# Patient Record
Sex: Female | Born: 1950
Health system: Southern US, Community
[De-identification: ages and names within clinical notes are randomized; demographics above are authoritative.]

## PROBLEM LIST (undated history)

## (undated) DIAGNOSIS — K219 Gastro-esophageal reflux disease without esophagitis: Secondary | ICD-10-CM

## (undated) DIAGNOSIS — C44629 Squamous cell carcinoma of skin of left upper limb, including shoulder: Secondary | ICD-10-CM

## (undated) DIAGNOSIS — Z5189 Encounter for other specified aftercare: Secondary | ICD-10-CM

## (undated) DIAGNOSIS — C44729 Squamous cell carcinoma of skin of left lower limb, including hip: Secondary | ICD-10-CM

## (undated) DIAGNOSIS — S72145A Nondisplaced intertrochanteric fracture of left femur, initial encounter for closed fracture: Secondary | ICD-10-CM

## (undated) DIAGNOSIS — I10 Essential (primary) hypertension: Secondary | ICD-10-CM

## (undated) DIAGNOSIS — H269 Unspecified cataract: Secondary | ICD-10-CM

## (undated) DIAGNOSIS — C801 Malignant (primary) neoplasm, unspecified: Secondary | ICD-10-CM

## (undated) DIAGNOSIS — M8580 Other specified disorders of bone density and structure, unspecified site: Secondary | ICD-10-CM

## (undated) HISTORY — DX: Malignant (primary) neoplasm, unspecified: C80.1

## (undated) HISTORY — DX: Unspecified cataract: H26.9

## (undated) HISTORY — DX: Encounter for other specified aftercare: Z51.89

## (undated) HISTORY — DX: Squamous cell carcinoma of skin of left lower limb, including hip: C44.729

## (undated) HISTORY — DX: Nondisplaced intertrochanteric fracture of left femur, initial encounter for closed fracture: S72.145A

## (undated) HISTORY — DX: Gastro-esophageal reflux disease without esophagitis: K21.9

## (undated) HISTORY — PX: CHOLECYSTECTOMY: SHX55

## (undated) HISTORY — PX: BREAST EXCISIONAL BIOPSY: SUR124

## (undated) HISTORY — DX: Squamous cell carcinoma of skin of left upper limb, including shoulder: C44.629

## (undated) HISTORY — PX: FRACTURE SURGERY: SHX138

---

## 2015-07-15 DIAGNOSIS — N952 Postmenopausal atrophic vaginitis: Secondary | ICD-10-CM | POA: Insufficient documentation

## 2015-07-15 DIAGNOSIS — J309 Allergic rhinitis, unspecified: Secondary | ICD-10-CM | POA: Insufficient documentation

## 2015-07-15 DIAGNOSIS — M8580 Other specified disorders of bone density and structure, unspecified site: Secondary | ICD-10-CM | POA: Insufficient documentation

## 2015-07-15 DIAGNOSIS — Z780 Asymptomatic menopausal state: Secondary | ICD-10-CM | POA: Insufficient documentation

## 2015-07-16 DIAGNOSIS — L409 Psoriasis, unspecified: Secondary | ICD-10-CM | POA: Insufficient documentation

## 2016-09-07 LAB — HM COLONOSCOPY

## 2019-07-03 ENCOUNTER — Other Ambulatory Visit: Payer: Self-pay | Admitting: Adult Health

## 2019-07-03 ENCOUNTER — Ambulatory Visit: Payer: Self-pay | Admitting: Medical

## 2019-07-03 MED ORDER — SODIUM CHLORIDE 0.9 % IV SOLN
700.0000 mg | Freq: Once | INTRAVENOUS | Status: AC
  Administered 2019-07-04: 700 mg via INTRAVENOUS
  Filled 2019-07-03: qty 20

## 2019-07-03 NOTE — Progress Notes (Signed)
  I connected by phone with Kimberly Whitney on 07/03/2019 at 5:17 PM to discuss the potential use of an new treatment for mild to moderate COVID-19 viral infection in non-hospitalized patients.  This patient is a 69 y.o. female that meets the FDA criteria for Emergency Use Authorization of bamlanivimab or casirivimab\imdevimab.  Has a (+) direct SARS-CoV-2 viral test result  Has mild or moderate COVID-19   Is ? 69 years of age and weighs ? 40 kg  Is NOT hospitalized due to COVID-19  Is NOT requiring oxygen therapy or requiring an increase in baseline oxygen flow rate due to COVID-19  Is within 10 days of symptom onset  Has at least one of the high risk factor(s) for progression to severe COVID-19 and/or hospitalization as defined in EUA.  Specific high risk criteria : >/= 68 yo   I have spoken and communicated the following to the patient or parent/caregiver:  1. FDA has authorized the emergency use of bamlanivimab and casirivimab\imdevimab for the treatment of mild to moderate COVID-19 in adults and pediatric patients with positive results of direct SARS-CoV-2 viral testing who are 6 years of age and older weighing at least 40 kg, and who are at high risk for progressing to severe COVID-19 and/or hospitalization.  2. The significant known and potential risks and benefits of bamlanivimab and casirivimab\imdevimab, and the extent to which such potential risks and benefits are unknown.  3. Information on available alternative treatments and the risks and benefits of those alternatives, including clinical trials.  4. Patients treated with bamlanivimab and casirivimab\imdevimab should continue to self-isolate and use infection control measures (e.g., wear mask, isolate, social distance, avoid sharing personal items, clean and disinfect "high touch" surfaces, and frequent handwashing) according to CDC guidelines.   5. The patient or parent/caregiver has the option to accept or refuse  bamlanivimab or casirivimab\imdevimab .  After reviewing this information with the patient, The patient agreed to proceed with receiving the bamlanimivab infusion and will be provided a copy of the Fact sheet prior to receiving the infusion.   Test results faxed over . Above information discussed and sent to patient my chart per team earlier today , patient declines any questions . [name] 07/03/2019 5:17 PM

## 2019-07-04 ENCOUNTER — Encounter (HOSPITAL_COMMUNITY): Payer: Self-pay

## 2019-07-04 ENCOUNTER — Ambulatory Visit (HOSPITAL_COMMUNITY)
Admission: RE | Admit: 2019-07-04 | Discharge: 2019-07-04 | Disposition: A | Discharge status: Home / Self Care | Payer: BC Managed Care – PPO | Source: Ambulatory Visit | Attending: Pulmonary Disease | Admitting: Pulmonary Disease

## 2019-07-04 DIAGNOSIS — Z20822 Contact with and (suspected) exposure to covid-19: Secondary | ICD-10-CM | POA: Insufficient documentation

## 2019-07-04 MED ORDER — ALBUTEROL SULFATE HFA 108 (90 BASE) MCG/ACT IN AERS: 2.0000 | INHALATION_SPRAY | Freq: Once | RESPIRATORY_TRACT | Status: DC | PRN

## 2019-07-04 MED ORDER — SODIUM CHLORIDE 0.9 % IV SOLN
INTRAVENOUS | Status: DC | PRN
  Administered 2019-07-04: 250 mL via INTRAVENOUS

## 2019-07-04 MED ORDER — DIPHENHYDRAMINE HCL 50 MG/ML IJ SOLN: 50.0000 mg | Freq: Once | INTRAMUSCULAR | Status: DC | PRN

## 2019-07-04 MED ORDER — FAMOTIDINE IN NACL 20-0.9 MG/50ML-% IV SOLN: 20.0000 mg | Freq: Once | INTRAVENOUS | Status: DC | PRN

## 2019-07-04 MED ORDER — METHYLPREDNISOLONE SODIUM SUCC 125 MG IJ SOLR: 125.0000 mg | Freq: Once | INTRAMUSCULAR | Status: DC | PRN

## 2019-07-04 MED ORDER — EPINEPHRINE 0.3 MG/0.3ML IJ SOAJ: 0.3000 mg | Freq: Once | INTRAMUSCULAR | Status: DC | PRN

## 2019-07-04 NOTE — Progress Notes (Signed)
  Diagnosis: COVID-19  Physician:  Procedure: [facility] Med: bamlanivimab infusion - Provided patient with bamlanimivab fact sheet for patients, parents and caregivers prior to infusion.  Complications: No immediate complications noted.  Discharge: Discharged home   [place], [place] 07/04/2019

## 2019-08-13 ENCOUNTER — Ambulatory Visit: Payer: Self-pay | Admitting: Family Medicine

## 2019-11-12 ENCOUNTER — Ambulatory Visit: Payer: Self-pay | Admitting: Family Medicine

## 2020-01-21 LAB — HM MAMMOGRAPHY

## 2020-01-21 LAB — HM DEXA SCAN

## 2020-01-23 ENCOUNTER — Inpatient Hospital Stay (HOSPITAL_COMMUNITY): Payer: BC Managed Care – PPO | Admitting: Certified Registered"

## 2020-01-23 ENCOUNTER — Encounter (HOSPITAL_COMMUNITY)
Admission: EM | Disposition: A | Discharge status: Rehabilitation Facility | Payer: Self-pay | Source: Home / Self Care | Attending: Internal Medicine

## 2020-01-23 ENCOUNTER — Emergency Department (HOSPITAL_COMMUNITY): Payer: BC Managed Care – PPO

## 2020-01-23 ENCOUNTER — Inpatient Hospital Stay (HOSPITAL_COMMUNITY)
Admission: EM | Admit: 2020-01-23 | Discharge: 2020-01-29 | DRG: 481 | Disposition: A | Discharge status: Rehabilitation Facility | Payer: BC Managed Care – PPO | Attending: Internal Medicine | Admitting: Internal Medicine

## 2020-01-23 ENCOUNTER — Other Ambulatory Visit: Payer: Self-pay

## 2020-01-23 ENCOUNTER — Encounter (HOSPITAL_COMMUNITY): Payer: Self-pay

## 2020-01-23 DIAGNOSIS — W108XXA Fall (on) (from) other stairs and steps, initial encounter: Secondary | ICD-10-CM | POA: Diagnosis present

## 2020-01-23 DIAGNOSIS — Z87891 Personal history of nicotine dependence: Secondary | ICD-10-CM

## 2020-01-23 DIAGNOSIS — Z79899 Other long term (current) drug therapy: Secondary | ICD-10-CM | POA: Diagnosis not present

## 2020-01-23 DIAGNOSIS — T465X5A Adverse effect of other antihypertensive drugs, initial encounter: Secondary | ICD-10-CM | POA: Diagnosis not present

## 2020-01-23 DIAGNOSIS — T50905A Adverse effect of unspecified drugs, medicaments and biological substances, initial encounter: Secondary | ICD-10-CM | POA: Diagnosis not present

## 2020-01-23 DIAGNOSIS — E876 Hypokalemia: Secondary | ICD-10-CM | POA: Diagnosis present

## 2020-01-23 DIAGNOSIS — M8580 Other specified disorders of bone density and structure, unspecified site: Secondary | ICD-10-CM | POA: Diagnosis present

## 2020-01-23 DIAGNOSIS — S72142D Displaced intertrochanteric fracture of left femur, subsequent encounter for closed fracture with routine healing: Secondary | ICD-10-CM | POA: Diagnosis present

## 2020-01-23 DIAGNOSIS — G8918 Other acute postprocedural pain: Secondary | ICD-10-CM

## 2020-01-23 DIAGNOSIS — R42 Dizziness and giddiness: Secondary | ICD-10-CM | POA: Diagnosis not present

## 2020-01-23 DIAGNOSIS — Z20822 Contact with and (suspected) exposure to covid-19: Secondary | ICD-10-CM | POA: Diagnosis present

## 2020-01-23 DIAGNOSIS — Z888 Allergy status to other drugs, medicaments and biological substances status: Secondary | ICD-10-CM

## 2020-01-23 DIAGNOSIS — K5903 Drug induced constipation: Secondary | ICD-10-CM

## 2020-01-23 DIAGNOSIS — S72002A Fracture of unspecified part of neck of left femur, initial encounter for closed fracture: Secondary | ICD-10-CM

## 2020-01-23 DIAGNOSIS — Z884 Allergy status to anesthetic agent status: Secondary | ICD-10-CM

## 2020-01-23 DIAGNOSIS — Y92010 Kitchen of single-family (private) house as the place of occurrence of the external cause: Secondary | ICD-10-CM | POA: Diagnosis not present

## 2020-01-23 DIAGNOSIS — R110 Nausea: Secondary | ICD-10-CM | POA: Diagnosis not present

## 2020-01-23 DIAGNOSIS — I10 Essential (primary) hypertension: Secondary | ICD-10-CM | POA: Diagnosis present

## 2020-01-23 DIAGNOSIS — R7401 Elevation of levels of liver transaminase levels: Secondary | ICD-10-CM | POA: Diagnosis present

## 2020-01-23 DIAGNOSIS — S72142A Displaced intertrochanteric fracture of left femur, initial encounter for closed fracture: Secondary | ICD-10-CM | POA: Diagnosis present

## 2020-01-23 DIAGNOSIS — D62 Acute posthemorrhagic anemia: Secondary | ICD-10-CM

## 2020-01-23 DIAGNOSIS — E871 Hypo-osmolality and hyponatremia: Secondary | ICD-10-CM | POA: Diagnosis present

## 2020-01-23 DIAGNOSIS — S72145D Nondisplaced intertrochanteric fracture of left femur, subsequent encounter for closed fracture with routine healing: Secondary | ICD-10-CM | POA: Diagnosis not present

## 2020-01-23 DIAGNOSIS — Z8781 Personal history of (healed) traumatic fracture: Secondary | ICD-10-CM | POA: Diagnosis not present

## 2020-01-23 DIAGNOSIS — M7989 Other specified soft tissue disorders: Secondary | ICD-10-CM | POA: Diagnosis not present

## 2020-01-23 DIAGNOSIS — W108XXD Fall (on) (from) other stairs and steps, subsequent encounter: Secondary | ICD-10-CM | POA: Diagnosis present

## 2020-01-23 DIAGNOSIS — Z9049 Acquired absence of other specified parts of digestive tract: Secondary | ICD-10-CM | POA: Diagnosis not present

## 2020-01-23 DIAGNOSIS — S72145A Nondisplaced intertrochanteric fracture of left femur, initial encounter for closed fracture: Secondary | ICD-10-CM

## 2020-01-23 DIAGNOSIS — I952 Hypotension due to drugs: Secondary | ICD-10-CM | POA: Diagnosis not present

## 2020-01-23 DIAGNOSIS — F419 Anxiety disorder, unspecified: Secondary | ICD-10-CM | POA: Diagnosis present

## 2020-01-23 DIAGNOSIS — Y92239 Unspecified place in hospital as the place of occurrence of the external cause: Secondary | ICD-10-CM | POA: Diagnosis not present

## 2020-01-23 HISTORY — DX: Other specified disorders of bone density and structure, unspecified site: M85.80

## 2020-01-23 HISTORY — PX: INTRAMEDULLARY (IM) NAIL INTERTROCHANTERIC: SHX5875

## 2020-01-23 HISTORY — DX: Essential (primary) hypertension: I10

## 2020-01-23 LAB — CBC WITH DIFFERENTIAL/PLATELET
Abs Immature Granulocytes: 0.05 10*3/uL (ref 0.00–0.07)
Abs Immature Granulocytes: 0.05 10*3/uL (ref 0.00–0.07)
Basophils Absolute: 0 10*3/uL (ref 0.0–0.1)
Basophils Absolute: 0 10*3/uL (ref 0.0–0.1)
Basophils Relative: 0 %
Basophils Relative: 0 %
Eosinophils Absolute: 0 10*3/uL (ref 0.0–0.5)
Eosinophils Absolute: 0 10*3/uL (ref 0.0–0.5)
Eosinophils Relative: 0 %
Eosinophils Relative: 0 %
HCT: 38.7 % (ref 36.0–46.0)
HCT: 35.9 % — ABNORMAL LOW (ref 36.0–46.0)
Hemoglobin: 13.3 g/dL (ref 12.0–15.0)
Hemoglobin: 12.6 g/dL (ref 12.0–15.0)
Immature Granulocytes: 0 %
Immature Granulocytes: 0 %
Lymphocytes Relative: 4 %
Lymphocytes Relative: 8 %
Lymphs Abs: 0.7 10*3/uL (ref 0.7–4.0)
Lymphs Abs: 1.1 10*3/uL (ref 0.7–4.0)
MCH: 31.1 pg (ref 26.0–34.0)
MCH: 31.7 pg (ref 26.0–34.0)
MCHC: 34.4 g/dL (ref 30.0–36.0)
MCHC: 35.1 g/dL (ref 30.0–36.0)
MCV: 90.6 fL (ref 80.0–100.0)
MCV: 90.2 fL (ref 80.0–100.0)
Monocytes Absolute: 0.9 10*3/uL (ref 0.1–1.0)
Monocytes Absolute: 0.8 10*3/uL (ref 0.1–1.0)
Monocytes Relative: 5 %
Monocytes Relative: 6 %
Neutro Abs: 17.2 10*3/uL — ABNORMAL HIGH (ref 1.7–7.7)
Neutro Abs: 11.4 10*3/uL — ABNORMAL HIGH (ref 1.7–7.7)
Neutrophils Relative %: 91 %
Neutrophils Relative %: 86 %
Platelets: 288 10*3/uL (ref 150–400)
Platelets: 243 10*3/uL (ref 150–400)
RBC: 4.27 MIL/uL (ref 3.87–5.11)
RBC: 3.98 MIL/uL (ref 3.87–5.11)
RDW: 12 % (ref 11.5–15.5)
RDW: 12 % (ref 11.5–15.5)
WBC: 18.8 10*3/uL — ABNORMAL HIGH (ref 4.0–10.5)
WBC: 13.4 10*3/uL — ABNORMAL HIGH (ref 4.0–10.5)
nRBC: 0 % (ref 0.0–0.2)
nRBC: 0 % (ref 0.0–0.2)

## 2020-01-23 LAB — RESPIRATORY PANEL BY RT PCR (FLU A&B, COVID)
Influenza A by PCR: NEGATIVE
Influenza B by PCR: NEGATIVE
SARS Coronavirus 2 by RT PCR: NEGATIVE

## 2020-01-23 LAB — BASIC METABOLIC PANEL
Anion gap: 15 (ref 5–15)
Anion gap: 11 (ref 5–15)
BUN: 14 mg/dL (ref 8–23)
BUN: 13 mg/dL (ref 8–23)
CO2: 22 mmol/L (ref 22–32)
CO2: 24 mmol/L (ref 22–32)
Calcium: 9.2 mg/dL (ref 8.9–10.3)
Calcium: 8.4 mg/dL — ABNORMAL LOW (ref 8.9–10.3)
Chloride: 96 mmol/L — ABNORMAL LOW (ref 98–111)
Chloride: 98 mmol/L (ref 98–111)
Creatinine, Ser: 0.8 mg/dL (ref 0.44–1.00)
Creatinine, Ser: 0.71 mg/dL (ref 0.44–1.00)
GFR calc Af Amer: >60 mL/min (ref >60)
GFR calc Af Amer: >60 mL/min (ref >60)
GFR calc non Af Amer: >60 mL/min (ref >60)
GFR calc non Af Amer: >60 mL/min (ref >60)
Glucose, Bld: 157 mg/dL — ABNORMAL HIGH (ref 70–99)
Glucose, Bld: 133 mg/dL — ABNORMAL HIGH (ref 70–99)
Potassium: 3 mmol/L — ABNORMAL LOW (ref 3.5–5.1)
Potassium: 3.3 mmol/L — ABNORMAL LOW (ref 3.5–5.1)
Sodium: 133 mmol/L — ABNORMAL LOW (ref 135–145)
Sodium: 133 mmol/L — ABNORMAL LOW (ref 135–145)

## 2020-01-23 LAB — PROTIME-INR
INR: 1.1 (ref 0.8–1.2)
INR: 1 (ref 0.8–1.2)
Prothrombin Time: 13.5 seconds (ref 11.4–15.2)
Prothrombin Time: 13.2 seconds (ref 11.4–15.2)

## 2020-01-23 LAB — CK: Total CK: 127 U/L (ref 38–234)

## 2020-01-23 LAB — HIV ANTIBODY (ROUTINE TESTING W REFLEX): HIV Screen 4th Generation wRfx: NONREACTIVE

## 2020-01-23 LAB — ABO/RH: ABO/RH(D): O NEG

## 2020-01-23 SURGERY — FIXATION, FRACTURE, INTERTROCHANTERIC, WITH INTRAMEDULLARY ROD
Anesthesia: General | Site: Hip | Laterality: Left

## 2020-01-23 MED ORDER — PROPOFOL 10 MG/ML IV BOLUS
INTRAVENOUS | Status: DC | PRN
  Administered 2020-01-23: 140 mg via INTRAVENOUS

## 2020-01-23 MED ORDER — ONDANSETRON HCL 4 MG/2ML IJ SOLN
INTRAMUSCULAR | Status: DC | PRN
  Administered 2020-01-23: 4 mg via INTRAVENOUS

## 2020-01-23 MED ORDER — CHLORTHALIDONE 50 MG PO TABS
50.0000 mg | ORAL_TABLET | Freq: Every day | ORAL | Status: DC
  Administered 2020-01-24 – 2020-01-29 (×6): 50 mg via ORAL
  Filled 2020-01-23 (×6): qty 1

## 2020-01-23 MED ORDER — SODIUM CHLORIDE 0.9 % IV SOLN: INTRAVENOUS | Status: DC

## 2020-01-23 MED ORDER — LACTATED RINGERS IV SOLN: INTRAVENOUS | Status: DC | PRN

## 2020-01-23 MED ORDER — FENTANYL CITRATE (PF) 250 MCG/5ML IJ SOLN
INTRAMUSCULAR | Status: AC
  Filled 2020-01-23: qty 5

## 2020-01-23 MED ORDER — TRANEXAMIC ACID-NACL 1000-0.7 MG/100ML-% IV SOLN
INTRAVENOUS | Status: AC | PRN
  Administered 2020-01-23: 2000 mg via INTRAVENOUS

## 2020-01-23 MED ORDER — POVIDONE-IODINE 10 % EX SWAB: 2.0000 "application " | Freq: Once | CUTANEOUS | Status: DC

## 2020-01-23 MED ORDER — PHENYLEPHRINE HCL (PRESSORS) 10 MG/ML IV SOLN
INTRAVENOUS | Status: DC | PRN
  Administered 2020-01-23: 80 ug via INTRAVENOUS

## 2020-01-23 MED ORDER — CEFAZOLIN SODIUM-DEXTROSE 2-4 GM/100ML-% IV SOLN: 2.0000 g | INTRAVENOUS | Status: DC

## 2020-01-23 MED ORDER — DIAZEPAM 5 MG/ML IJ SOLN
5.0000 mg | Freq: Once | INTRAMUSCULAR | Status: AC
  Administered 2020-01-23: 5 mg via INTRAVENOUS
  Filled 2020-01-23: qty 2

## 2020-01-23 MED ORDER — TRANEXAMIC ACID-NACL 1000-0.7 MG/100ML-% IV SOLN: 1000.0000 mg | INTRAVENOUS | Status: DC

## 2020-01-23 MED ORDER — PHENYLEPHRINE 40 MCG/ML (10ML) SYRINGE FOR IV PUSH (FOR BLOOD PRESSURE SUPPORT)
PREFILLED_SYRINGE | INTRAVENOUS | Status: AC
  Filled 2020-01-23: qty 10

## 2020-01-23 MED ORDER — SUCCINYLCHOLINE 20MG/ML (10ML) SYRINGE FOR MEDFUSION PUMP - OPTIME
INTRAMUSCULAR | Status: DC | PRN
  Administered 2020-01-23: 120 mg via INTRAVENOUS

## 2020-01-23 MED ORDER — MAGNESIUM 30 MG PO TABS: 30.0000 mg | ORAL_TABLET | Freq: Every day | ORAL | Status: DC

## 2020-01-23 MED ORDER — MORPHINE SULFATE (PF) 2 MG/ML IV SOLN
2.0000 mg | INTRAVENOUS | Status: DC | PRN
  Administered 2020-01-23: 2 mg via INTRAVENOUS
  Filled 2020-01-23: qty 1

## 2020-01-23 MED ORDER — EPHEDRINE SULFATE 50 MG/ML IJ SOLN
INTRAMUSCULAR | Status: DC | PRN
  Administered 2020-01-23: 10 mg via INTRAVENOUS

## 2020-01-23 MED ORDER — POTASSIUM CHLORIDE 10 MEQ/100ML IV SOLN
10.0000 meq | Freq: Once | INTRAVENOUS | Status: AC
  Administered 2020-01-23: 10 meq via INTRAVENOUS
  Filled 2020-01-23: qty 100

## 2020-01-23 MED ORDER — ACETAMINOPHEN 650 MG RE SUPP: 650.0000 mg | Freq: Four times a day (QID) | RECTAL | Status: DC | PRN

## 2020-01-23 MED ORDER — LOSARTAN POTASSIUM 50 MG PO TABS
100.0000 mg | ORAL_TABLET | Freq: Every day | ORAL | Status: DC
  Administered 2020-01-24 – 2020-01-29 (×6): 100 mg via ORAL
  Filled 2020-01-23 (×6): qty 2

## 2020-01-23 MED ORDER — ONDANSETRON HCL 4 MG PO TABS: 4.0000 mg | ORAL_TABLET | Freq: Four times a day (QID) | ORAL | Status: DC | PRN

## 2020-01-23 MED ORDER — POTASSIUM CHLORIDE 10 MEQ/100ML IV SOLN: 10.0000 meq | INTRAVENOUS | Status: AC

## 2020-01-23 MED ORDER — ONDANSETRON HCL 4 MG/2ML IJ SOLN
INTRAMUSCULAR | Status: AC
  Filled 2020-01-23: qty 2

## 2020-01-23 MED ORDER — PROPOFOL 10 MG/ML IV BOLUS
INTRAVENOUS | Status: AC
  Filled 2020-01-23: qty 20

## 2020-01-23 MED ORDER — LORATADINE 10 MG PO TABS
10.0000 mg | ORAL_TABLET | Freq: Every day | ORAL | Status: DC
  Administered 2020-01-24 – 2020-01-29 (×6): 10 mg via ORAL
  Filled 2020-01-23 (×6): qty 1

## 2020-01-23 MED ORDER — PHENYLEPHRINE 40 MCG/ML (10ML) SYRINGE FOR IV PUSH (FOR BLOOD PRESSURE SUPPORT)
PREFILLED_SYRINGE | INTRAVENOUS | Status: AC
  Filled 2020-01-23: qty 20

## 2020-01-23 MED ORDER — METOPROLOL SUCCINATE ER 50 MG PO TB24
50.0000 mg | ORAL_TABLET | Freq: Every evening | ORAL | Status: DC
  Administered 2020-01-24 – 2020-01-29 (×5): 50 mg via ORAL
  Filled 2020-01-23 (×6): qty 1

## 2020-01-23 MED ORDER — SODIUM CHLORIDE 0.9 % IV BOLUS
1000.0000 mL | Freq: Once | INTRAVENOUS | Status: AC
  Administered 2020-01-23: 1000 mL via INTRAVENOUS

## 2020-01-23 MED ORDER — ONDANSETRON HCL 4 MG/2ML IJ SOLN
4.0000 mg | Freq: Once | INTRAMUSCULAR | Status: AC
  Administered 2020-01-23: 4 mg via INTRAVENOUS
  Filled 2020-01-23: qty 2

## 2020-01-23 MED ORDER — TRANEXAMIC ACID-NACL 1000-0.7 MG/100ML-% IV SOLN
INTRAVENOUS | Status: DC | PRN
  Administered 2020-01-23: 1000 mg via INTRAVENOUS

## 2020-01-23 MED ORDER — TRANEXAMIC ACID 1000 MG/10ML IV SOLN
2000.0000 mg | INTRAVENOUS | Status: DC
  Filled 2020-01-23: qty 20

## 2020-01-23 MED ORDER — LOSARTAN POTASSIUM 50 MG PO TABS: 100.0000 mg | ORAL_TABLET | Freq: Every day | ORAL | Status: DC

## 2020-01-23 MED ORDER — PHENYLEPHRINE HCL-NACL 10-0.9 MG/250ML-% IV SOLN
INTRAVENOUS | Status: DC | PRN
  Administered 2020-01-23: 25 ug/min via INTRAVENOUS

## 2020-01-23 MED ORDER — FENTANYL CITRATE (PF) 250 MCG/5ML IJ SOLN
INTRAMUSCULAR | Status: DC | PRN
Start: 2020-01-23 — End: 2020-01-24
  Administered 2020-01-23: 100 ug via INTRAVENOUS
  Administered 2020-01-24: 50 ug via INTRAVENOUS

## 2020-01-23 MED ORDER — DEXAMETHASONE SODIUM PHOSPHATE 10 MG/ML IJ SOLN
INTRAMUSCULAR | Status: DC | PRN
  Administered 2020-01-23: 10 mg via INTRAVENOUS

## 2020-01-23 MED ORDER — ONDANSETRON HCL 4 MG/2ML IJ SOLN: 4.0000 mg | Freq: Four times a day (QID) | INTRAMUSCULAR | Status: DC | PRN

## 2020-01-23 MED ORDER — ROCURONIUM 10MG/ML (10ML) SYRINGE FOR MEDFUSION PUMP - OPTIME
INTRAVENOUS | Status: DC | PRN
  Administered 2020-01-23: 30 mg via INTRAVENOUS

## 2020-01-23 MED ORDER — DEXAMETHASONE SODIUM PHOSPHATE 10 MG/ML IJ SOLN
INTRAMUSCULAR | Status: AC
  Filled 2020-01-23: qty 1

## 2020-01-23 MED ORDER — MIDAZOLAM HCL 2 MG/2ML IJ SOLN
INTRAMUSCULAR | Status: AC
  Filled 2020-01-23: qty 2

## 2020-01-23 MED ORDER — CEFAZOLIN SODIUM-DEXTROSE 2-4 GM/100ML-% IV SOLN
2.0000 g | INTRAVENOUS | Status: AC
  Administered 2020-01-23: 2 g via INTRAVENOUS
  Filled 2020-01-23: qty 100

## 2020-01-23 MED ORDER — HYDROMORPHONE HCL 1 MG/ML IJ SOLN
1.0000 mg | INTRAMUSCULAR | Status: DC | PRN
  Administered 2020-01-23 – 2020-01-27 (×22): 1 mg via INTRAVENOUS
  Filled 2020-01-23 (×22): qty 1

## 2020-01-23 MED ORDER — LIDOCAINE 2% (20 MG/ML) 5 ML SYRINGE
INTRAMUSCULAR | Status: AC
  Filled 2020-01-23: qty 5

## 2020-01-23 MED ORDER — ACETAMINOPHEN 325 MG PO TABS
650.0000 mg | ORAL_TABLET | Freq: Four times a day (QID) | ORAL | Status: DC | PRN
  Administered 2020-01-26: 650 mg via ORAL
  Filled 2020-01-23: qty 2

## 2020-01-23 MED ORDER — HYDROMORPHONE HCL 1 MG/ML IJ SOLN
1.0000 mg | Freq: Once | INTRAMUSCULAR | Status: AC
  Administered 2020-01-23: 1 mg via INTRAVENOUS
  Filled 2020-01-23: qty 1

## 2020-01-23 MED ORDER — 0.9 % SODIUM CHLORIDE (POUR BTL) OPTIME
TOPICAL | Status: DC | PRN
  Administered 2020-01-23: 1000 mL

## 2020-01-23 MED ORDER — LIDOCAINE HCL (CARDIAC) PF 100 MG/5ML IV SOSY
PREFILLED_SYRINGE | INTRAVENOUS | Status: DC | PRN
  Administered 2020-01-23: 20 mg via INTRAVENOUS

## 2020-01-23 MED ORDER — HYDROMORPHONE HCL 1 MG/ML IJ SOLN
0.5000 mg | Freq: Once | INTRAMUSCULAR | Status: AC
  Administered 2020-01-23: 0.5 mg via INTRAVENOUS
  Filled 2020-01-23: qty 1

## 2020-01-23 MED ORDER — ROCURONIUM BROMIDE 10 MG/ML (PF) SYRINGE
PREFILLED_SYRINGE | INTRAVENOUS | Status: AC
  Filled 2020-01-23: qty 10

## 2020-01-23 MED ORDER — TRANEXAMIC ACID-NACL 1000-0.7 MG/100ML-% IV SOLN
INTRAVENOUS | Status: AC
  Filled 2020-01-23: qty 100

## 2020-01-23 SURGICAL SUPPLY — 46 items
BIT DRILL INTERTAN LAG SCREW (BIT) ×2 IMPLANT
BIT DRILL SHORT 4.0 (BIT) ×2 IMPLANT
BNDG COHESIVE 4X5 TAN STRL (GAUZE/BANDAGES/DRESSINGS) ×2 IMPLANT
BNDG COHESIVE 6X5 TAN STRL LF (GAUZE/BANDAGES/DRESSINGS) IMPLANT
BNDG GAUZE ELAST 4 BULKY (GAUZE/BANDAGES/DRESSINGS) ×2 IMPLANT
COVER PERINEAL POST (MISCELLANEOUS) ×2 IMPLANT
COVER SURGICAL LIGHT HANDLE (MISCELLANEOUS) ×2 IMPLANT
COVER WAND RF STERILE (DRAPES) ×2 IMPLANT
DRAPE C-ARMOR (DRAPES) ×2 IMPLANT
DRAPE STERI IOBAN 125X83 (DRAPES) ×2 IMPLANT
DRILL BIT SHORT 4.0 (BIT) ×4
DRSG MEPILEX BORDER 4X4 (GAUZE/BANDAGES/DRESSINGS) ×2 IMPLANT
DRSG MEPILEX BORDER 4X8 (GAUZE/BANDAGES/DRESSINGS) ×2 IMPLANT
DRSG PAD ABDOMINAL 8X10 ST (GAUZE/BANDAGES/DRESSINGS) ×4 IMPLANT
DURAPREP 26ML APPLICATOR (WOUND CARE) ×4 IMPLANT
ELECT REM PT RETURN 9FT ADLT (ELECTROSURGICAL) ×2
ELECTRODE REM PT RTRN 9FT ADLT (ELECTROSURGICAL) ×1 IMPLANT
GLOVE BIOGEL PI IND STRL 7.0 (GLOVE) ×3 IMPLANT
GLOVE BIOGEL PI INDICATOR 7.0 (GLOVE) ×3
GLOVE ECLIPSE 7.0 STRL STRAW (GLOVE) ×4 IMPLANT
GLOVE SKINSENSE NS SZ7.5 (GLOVE) ×2
GLOVE SKINSENSE STRL SZ7.5 (GLOVE) ×2 IMPLANT
GOWN STRL REIN XL XLG (GOWN DISPOSABLE) ×2 IMPLANT
GUIDE PIN 3.2X343 (PIN) ×2
GUIDE PIN 3.2X343MM (PIN) ×4
KIT BASIN OR (CUSTOM PROCEDURE TRAY) ×2 IMPLANT
KIT TURNOVER KIT B (KITS) ×2 IMPLANT
MANIFOLD NEPTUNE II (INSTRUMENTS) ×2 IMPLANT
NAIL TRIGEN LEFT 10X38-125 (Nail) ×2 IMPLANT
NS IRRIG 1000ML POUR BTL (IV SOLUTION) ×2 IMPLANT
PACK GENERAL/GYN (CUSTOM PROCEDURE TRAY) ×2 IMPLANT
PAD ARMBOARD 7.5X6 YLW CONV (MISCELLANEOUS) ×4 IMPLANT
PAD CAST 4YDX4 CTTN HI CHSV (CAST SUPPLIES) ×2 IMPLANT
PADDING CAST COTTON 4X4 STRL (CAST SUPPLIES) ×4
PIN GUIDE 3.2X343MM (PIN) ×2 IMPLANT
SCREW LAG COMPR KIT 85/80 (Screw) ×2 IMPLANT
SCREW TRIGEN LOW PROF 5.0X37.5 (Screw) ×2 IMPLANT
SCREW TRIGEN LOW PROF 5.0X45 (Screw) ×2 IMPLANT
STAPLER VISISTAT 35W (STAPLE) ×2 IMPLANT
SUT VIC AB 0 CT1 27 (SUTURE) ×2
SUT VIC AB 0 CT1 27XBRD ANBCTR (SUTURE) ×1 IMPLANT
SUT VIC AB 2-0 CT1 27 (SUTURE) ×2
SUT VIC AB 2-0 CT1 TAPERPNT 27 (SUTURE) ×1 IMPLANT
TOWEL GREEN STERILE (TOWEL DISPOSABLE) ×2 IMPLANT
TOWEL GREEN STERILE FF (TOWEL DISPOSABLE) ×2 IMPLANT
WATER STERILE IRR 1000ML POUR (IV SOLUTION) ×2 IMPLANT

## 2020-01-23 NOTE — Progress Notes (Signed)
Patient to OR, Report called

## 2020-01-23 NOTE — Anesthesia Procedure Notes (Signed)
Procedure Name: Intubation Date/Time: 01/23/2020 10:54 PM Performed by: Valetta Fuller, CRNA Pre-anesthesia Checklist: Patient identified, Emergency Drugs available, Suction available and Patient being monitored Patient Re-evaluated:Patient Re-evaluated prior to induction Oxygen Delivery Method: Circle system utilized Preoxygenation: Pre-oxygenation with 100% oxygen Induction Type: IV induction, Rapid sequence and Cricoid Pressure applied Laryngoscope Size: Miller and 2 Grade View: Grade II Tube size: 7.5 mm Number of attempts: 1 Airway Equipment and Method: Stylet Placement Confirmation: ETT inserted through vocal cords under direct vision,  positive ETCO2 and breath sounds checked- equal and bilateral Secured at: 22 cm Tube secured with: Tape Dental Injury: Teeth and Oropharynx as per pre-operative assessment

## 2020-01-23 NOTE — Anesthesia Preprocedure Evaluation (Addendum)
Anesthesia Evaluation  Patient identified by MRN, date of birth, ID band Patient awake    Reviewed: Allergy & Precautions, NPO status , Patient's Chart, lab work & pertinent test results, reviewed documented beta blocker date and time   History of Anesthesia Complications Negative for: history of anesthetic complications  Airway Mallampati: II  TM Distance: >3 FB Neck ROM: Full    Dental  (+) Dental Advisory Given   Pulmonary former smoker,  01/23/2020 SARS coronavirus NEG   breath sounds clear to auscultation       Cardiovascular hypertension, Pt. on medications and Pt. on home beta blockers (-) angina Rhythm:Regular Rate:Normal     Neuro/Psych negative neurological ROS     GI/Hepatic Neg liver ROS, GERD  Poorly Controlled,  Endo/Other  negative endocrine ROS  Renal/GU negative Renal ROS     Musculoskeletal   Abdominal   Peds  Hematology negative hematology ROS (+)   Anesthesia Other Findings   Reproductive/Obstetrics                           Anesthesia Physical Anesthesia Plan  ASA: II  Anesthesia Plan: General   Post-op Pain Management:    Induction: Intravenous and Rapid sequence  PONV Risk Score and Plan: 3 and Ondansetron, Dexamethasone, Treatment may vary due to age or medical condition and Droperidol  Airway Management Planned: Oral ETT  Additional Equipment: None  Intra-op Plan:   Post-operative Plan: Extubation in OR  Informed Consent: I have reviewed the patients History and Physical, chart, labs and discussed the procedure including the risks, benefits and alternatives for the proposed anesthesia with the patient or authorized representative who has indicated his/her understanding and acceptance.     Dental advisory given  Plan Discussed with: CRNA and Surgeon  Anesthesia Plan Comments:        Anesthesia Quick Evaluation

## 2020-01-23 NOTE — Progress Notes (Signed)
Received patient from ED patient refusing to be rolled in bed to be washed up before surgery tonight.

## 2020-01-23 NOTE — Progress Notes (Signed)
PHARMACIST - PHYSICIAN ORDER COMMUNICATION  CONCERNING: P&T Medication Policy on Herbal Medications  DESCRIPTION:  This patient's order for:  Magnesium 30 mg has been noted.    This product(s) is classified as an "herbal" or natural product. Due to a lack of definitive safety studies or FDA approval, nonstandard manufacturing practices, plus the potential risk of unknown drug-drug interactions while on inpatient medications, the Pharmacy and Therapeutics Committee does not permit the use of "herbal" or natural products of this type within [facility].   ACTION TAKEN: The pharmacy department is unable to verify this order at this time and your patient has been informed of this safety policy. Please reevaluate patient's clinical condition at discharge and address if the herbal or natural product(s) should be resumed at that time.   Nicole Cella, RPh Clinical Pharmacist Please check AMION for all [place] phone numbers After 10:00 PM, call [place] [phone]  01/23/2020 8:49 PM

## 2020-01-23 NOTE — ED Provider Notes (Signed)
Medical screening examination/treatment/procedure(s) were conducted as a shared visit with non-physician practitioner(s) and myself.  I personally evaluated the patient during the encounter.  EKG Interpretation  Date/Time:  Thursday January 23 2020 15:37:10 EDT Ventricular Rate:  77 PR Interval:    QRS Duration: 148 QT Interval:  453 QTC Calculation: 513 R Axis:   -49 Text Interpretation: Sinus rhythm RBBB and LAFB LVH by voltage No old tracing to compare Confirmed by Lacretia Leigh [phone]) on 01/23/2020 4:65:57 PM   69 year old female here for mechanical fall just prior to arrival. Patient has a left hip fracture. Will admit to medicine and consult orthopedics   Lacretia Leigh, MD 01/23/20 1744

## 2020-01-23 NOTE — Consult Note (Addendum)
ORTHOPAEDIC CONSULTATION  REQUESTING PHYSICIAN: Kimberly Reach, MD  Chief Complaint: Left intertroch fracture  HPI: Patient is a 69 year old female who had mechanical fall off a couple of steps earlier today landing directly on her left hip with immediate pain and inability to bear weight or move LLE.  Brought to the ER for evaluation.  xrays shows displaced left intertrochanteric hip fracture.   Ortho consulted for surgical evaluation.  Past Medical History:  Diagnosis Date  . Hypertension   . Osteopenia     Social History   Socioeconomic History  . Marital status: Married    Spouse name: Not on file  . Number of children: Not on file  . Years of education: Not on file  . Highest education level: Not on file  Occupational History  . Not on file  Tobacco Use  . Smoking status: Former Research scientist (life sciences)  . Smokeless tobacco: Never Used  Substance and Sexual Activity  . Alcohol use: Never  . Drug use: Not on file  . Sexual activity: Not on file  Other Topics Concern  . Not on file  Social History Narrative  . Not on file   Social Determinants of Health   Financial Resource Strain:   . Difficulty of Paying Living Expenses: Not on file  Food Insecurity:   . Worried About Charity fundraiser in the Last Year: Not on file  . Ran Out of Food in the Last Year: Not on file  Transportation Needs:   . Lack of Transportation (Medical): Not on file  . Lack of Transportation (Non-Medical): Not on file  Physical Activity:   . Days of Exercise per Week: Not on file  . Minutes of Exercise per Session: Not on file  Stress:   . Feeling of Stress : Not on file  Social Connections:   . Frequency of Communication with Friends and Family: Not on file  . Frequency of Social Gatherings with Friends and Family: Not on file  . Attends Religious Services: Not on file  . Active Member of Clubs or Organizations: Not on file  . Attends Archivist Meetings: Not on file  . Marital  Status: Not on file   No family history on file. Allergies  Allergen Reactions  . Lisinopril Cough  . Lidocaine Rash    Lidocaine patch   Prior to Admission medications   Medication Sig Start Date End Date Taking? Authorizing Provider  cetirizine (ZYRTEC ALLERGY) 10 MG tablet Take 10 mg by mouth at bedtime.   Yes [provider]  chlorthalidone (HYGROTON) 50 MG tablet Take 50 mg by mouth daily. 12/28/19  Yes [provider]  losartan (COZAAR) 100 MG tablet Take 100 mg by mouth daily. 12/02/19  Yes [provider]  magnesium 30 MG tablet Take 30 mg by mouth daily.   Yes [provider]  metoprolol succinate (TOPROL-XL) 50 MG 24 hr tablet Take 50 mg by mouth every evening.  11/25/19  Yes [provider]   DG Chest 1 View  Result Date: 01/23/2020 CLINICAL DATA:  [place], hip fracture, preoperative evaluation EXAM: CHEST  1 VIEW COMPARISON:  08/29/2015 FINDINGS: The heart size and mediastinal contours are within normal limits. Both lungs are clear. The visualized skeletal structures are unremarkable. IMPRESSION: No active disease. Electronically Signed   By: Kimberly Ngo M.D.   On: 01/23/2020 16:56   DG Hip Unilat With Pelvis 2-3 Views Left  Result Date: 01/23/2020 CLINICAL DATA:  [place] off  stool EXAM: DG HIP (WITH OR WITHOUT PELVIS) 2-3V LEFT COMPARISON:  None. FINDINGS: Frontal view of the pelvis as well as frontal and frogleg lateral views of the left hip are obtained. There is a comminuted intertrochanteric left hip fracture with varus angulation at the fracture site. No dislocation. Remainder of the bony pelvis is unremarkable. The right hip is well aligned. IMPRESSION: 1. Comminuted inter trochanteric left hip fracture with varus angulation. Electronically Signed   By: Kimberly Ngo M.D.   On: 01/23/2020 16:51    All pertinent xrays, MRI, CT independently reviewed and interpreted  Positive ROS: All other systems have been reviewed and were  otherwise negative with the exception of those mentioned in the HPI and as above.  Physical Exam: General: No acute distress Cardiovascular: No pedal edema Respiratory: No cyanosis, no use of accessory musculature GI: No organomegaly, abdomen is soft and non-tender Skin: No lesions in the area of chief complaint Neurologic: Sensation intact distally Psychiatric: Patient is at baseline mood and affect Lymphatic: No axillary or cervical lymphadenopathy  MUSCULOSKELETAL:  - severe pain with movement of the hip and extremity - skin intact - NVI distally - compartments soft  Assessment: Left intertrochanteric fracture  Plan: - surgical treatment is recommended for pain relief, quality of life and early mobilization - patient and family are aware of r/b/a and wish to proceed, informed consent obtained - medical optimization per primary team - surgery is planned for this evening pending medical clearance - please hold lovenox for surgery tonight  Thank you for the consult and the opportunity to see Kimberly Whitney  N. Eduard Roux, MD [facility] 7:05 PM

## 2020-01-23 NOTE — ED Provider Notes (Signed)
[facility] EMERGENCY DEPARTMENT Provider Note   CSN: [id number] Arrival date & time: 01/23/20  1458     History Chief Complaint  Patient presents with  . Hip Injury  . Kimberly Whitney is a 70 y.o. female.  The history is provided by the patient and medical records. No language interpreter was used.  Fall      69 year old female with history of osteopenia, hypertension, brought here via EMS from home for evaluation of a recent fall.  Patient report this morning she was walking down a 2-step step stool and she misstepped causing a fall.  She reported falling on the left side of her body and striking her hip against the hardwood floor.  Incident happened at 830 this morning.  She report acute onset of severe sharp pain after the fall and was unable to get up from the floor.  Pain is intense worse with movement.  No associated headache, neck pain, chest pain, abdominal pain, back pain or pain anywhere else.  She denies any precipitating symptoms prior to the fall.  She denies any loss of consciousness.  She was laying on the floor for approximately 5 hours until her husband came home and saw her.  EMS was contacted and patient brought here.  Patient did receive 100 mcg of fentanyl as well as 18 mg of ketamine on route.  She report the ketamine have caused her to feel very anxious.  She still endorse moderate amount of pain at this time.  She denies any numbness.  She has not been vaccinated for COVID-19.  She is not on a blood thinner medication.  Patient reports she broke her L hip many years ago requiring temporary hardware which has been removed.  No past medical history on file.  There are no problems to display for this patient.   The histories are not reviewed yet. Please review them in the "History" navigator section and refresh this [place].   OB History   No obstetric history on file.     No family history on file.  Social History   Tobacco Use    . Smoking status: Not on file  Substance Use Topics  . Alcohol use: Not on file  . Drug use: Not on file    Home Medications Prior to Admission medications   Not on File    Allergies    Patient has no allergy information on record.  Review of Systems   Review of Systems  All other systems reviewed and are negative.   Physical Exam Updated Vital Signs BP (!) 142/59   Pulse 74   Temp 98.8 F (37.1 C) (Oral)   Resp 12   Ht 5' 5.5" (1.664 m)   Wt 82.6 kg   SpO2 99%   BMI 29.83 kg/m   Physical Exam Vitals and nursing note reviewed.  Constitutional:      Appearance: She is well-developed. She is obese.     Comments: Patient laying in bed, appears uncomfortable but nontoxic  HENT:     Head: Normocephalic and atraumatic.  Eyes:     Extraocular Movements: Extraocular movements intact.     Conjunctiva/sclera: Conjunctivae normal.     Pupils: Pupils are equal, round, and reactive to light.  Cardiovascular:     Rate and Rhythm: Normal rate and regular rhythm.     Pulses: Normal pulses.     Heart sounds: Normal heart sounds.  Pulmonary:     Effort: Pulmonary  effort is normal.     Breath sounds: Normal breath sounds.  Abdominal:     Palpations: Abdomen is soft.     Tenderness: There is no abdominal tenderness.  Musculoskeletal:        General: Signs of injury (Left hip: Tenderness along the hip and pelvic region on palpation with decreased hip range of motion secondary to pain.  Left leg is shortened and externally rotated.  No significant tenderness along the left femur.  Left knee nontender.) present.     Cervical back: Normal range of motion and neck supple. No tenderness.     Comments: No midline spine tenderness.  Small abrasion noted to left elbow with normal elbow range of motion and minimal tenderness.  Skin:    Capillary Refill: Capillary refill takes less than 2 seconds.     Findings: No rash.  Neurological:     Mental Status: She is alert and oriented to  person, place, and time.  Psychiatric:        Mood and Affect: Mood normal.     ED Results / Procedures / Treatments   Labs (all labs ordered are listed, but only abnormal results are displayed) Labs Reviewed  BASIC METABOLIC PANEL - Abnormal; Notable for the following components:      Result Value   Sodium 133 (*)    Potassium 3.0 (*)    Chloride 96 (*)    Glucose, Bld 157 (*)    All other components within normal limits  CBC WITH DIFFERENTIAL/PLATELET - Abnormal; Notable for the following components:   WBC 18.8 (*)    Neutro Abs 17.2 (*)    All other components within normal limits  RESPIRATORY PANEL BY RT PCR (FLU A&B, COVID)  PROTIME-INR  BASIC METABOLIC PANEL  CBC WITH DIFFERENTIAL/PLATELET  PROTIME-INR  CK  TYPE AND SCREEN  ABO/RH    EKG EKG Interpretation  Date/Time:  Thursday January 23 2020 15:37:10 EDT Ventricular Rate:  77 PR Interval:    QRS Duration: 148 QT Interval:  453 QTC Calculation: 513 R Axis:   -49 Text Interpretation: Sinus rhythm RBBB and LAFB LVH by voltage No old tracing to compare Confirmed by Lacretia Leigh ([id number]) on 01/23/2020 4:56:11 PM   Radiology DG Hip Unilat With Pelvis 2-3 Views Left  Result Date: 01/23/2020 CLINICAL DATA:  [place] off stool EXAM: DG HIP (WITH OR WITHOUT PELVIS) 2-3V LEFT COMPARISON:  None. FINDINGS: Frontal view of the pelvis as well as frontal and frogleg lateral views of the left hip are obtained. There is a comminuted intertrochanteric left hip fracture with varus angulation at the fracture site. No dislocation. Remainder of the bony pelvis is unremarkable. The right hip is well aligned. IMPRESSION: 1. Comminuted inter trochanteric left hip fracture with varus angulation. Electronically Signed   By: Randa Ngo M.D.   On: 01/23/2020 16:51    Procedures Procedures (including critical care time)  Medications Ordered in ED Medications  sodium chloride 0.9 % bolus 1,000 mL (0 mLs Intravenous Stopped 01/23/20  1704)    And  0.9 %  sodium chloride infusion ( Intravenous New Bag/Given 01/23/20 1711)  HYDROmorphone (DILAUDID) injection 1 mg (1 mg Intravenous Given 01/23/20 1603)  potassium chloride 10 mEq in 100 mL IVPB (has no administration in time range)  diazepam (VALIUM) injection 5 mg (has no administration in time range)  HYDROmorphone (DILAUDID) injection 1 mg (1 mg Intravenous Given 01/23/20 1526)  ondansetron (ZOFRAN) injection 4 mg (4 mg Intravenous Given 01/23/20 1559)  potassium chloride 10 mEq in 100 mL IVPB (10 mEq Intravenous New Bag/Given 01/23/20 1712)  HYDROmorphone (DILAUDID) injection 0.5 mg (0.5 mg Intravenous Given 01/23/20 1734)    ED Course  I have reviewed the triage vital signs and the nursing notes.  Pertinent labs & imaging results that were available during my care of the patient were reviewed by me and considered in my medical decision making (see chart for details).    MDM Rules/Calculators/A&P                          BP (!) 142/59   Pulse 74   Temp 98.8 F (37.1 C) (Oral)   Resp 12   Ht 5' 5.5" (1.664 m)   Wt 82.6 kg   SpO2 99%   BMI 29.83 kg/m   Final Clinical Impression(s) / ED Diagnoses Final diagnoses:  Displaced intertrochanteric fracture of left femur, init ([place])    Rx / DC Orders ED Discharge Orders    None     3:38 PM Patient had a mechanical fall landed on her left hip earlier today and was unable to ambulate or bear any weight to even get up.  I suspect she is likely having a left hip fracture.  Work-up initiated.  Pain medication given.  4:55 PM X-ray of the left hip demonstrate a comminuted intertrochanteric left hip fracture with varus angulation.  This is a closed injury.  Patient is neurovascular intact with intact dorsalis pedis pulse.  She will need to be admitted.  Screening chest x-ray ordered.  Chest x-ray unremarkable.  Labs remarkable for hypokalemia with potassium of 3.0.  Will keep patient n.p.o., will give supplementation via  the IV.  As needed pain medication in place.  Patient has a white count of 18.8 this is likely secondary to stress margination from her injury.  She was on the floor for approximately 5 hours, a CK have been ordered and is currently pending.  IV fluid given.  Care discussed with Dr. Zenia Resides.   5:06 PM Appreciate consultation from on-call orthopedic specialist Dr. Erlinda Hong who request medicine admission and for pt to be cleared for surgery tonight.   6:42 PM Appreciate consultation from [facility] Dr. Jonelle Sidle who agrees to see and admit pt for medical clearance prior to surgery tonight. COVID-19 test is negative.    Domenic Moras, PA-C 01/23/20 1843    Lacretia Leigh, MD 01/27/20 [phone]

## 2020-01-23 NOTE — ED Triage Notes (Signed)
Pt brought in by [facility] from home. Pt was stepping off step stool when she missed the last step and fell onto her L side on the hardwood floor at 0830. Pt was lying on floor approximately 5hrs. Shortening and rotation noted to L leg. EMS gave 154mcg fentanyl at 1357 and 18mg  ketamine at 1442

## 2020-01-23 NOTE — H&P (Signed)
History and Physical   Docie Abramovich NAT:[id number] DOB: [birth year] DOA: 01/23/2020  Referring MD/NP/PA: Dr Lacretia Leigh  PCP: Pcp, No   Outpatient Specialists: None   Patient coming from: Home  Chief Complaint: Fall with left hip injury  HPI: Kimberly Whitney is a 69 y.o. female with medical history significant of hypertension who was in her kitchen got on a stool ladder and missed her steps.  She fell and landed on her left side.  Patient sustained a left femoral fracture.  Seen in the ED and evaluated.  She was subsequently admitted to the hospital after orthopedic consult.  She had no known history of coronary artery disease.  No chest pain.  Patient denied any fever or chills.  No other injuries.  It was purely mechanical fall.  Patient seen by EMS and brought to the hospital where she was found to have the fracture.  No other complaint.  She has had previous left hip fracture with temporary hardware that was removed many years ago.  Dr. Sherrian Divers has seen the patient and plan surgery early tomorrow morning and we are admitting the patient for further work-up..  ED Course: Temperature 98.8 blood pressure 160s over 77 pulse 80 respiratory rate 19 oxygen sat 91% room air.  White count 14.9 hemoglobin 11.4 and platelets 243.  Sodium 133 potassium 3.3 chloride 98 and creatinine 0.71.  COVID-19 screen is negative.  Chest x-ray showed no acute findings.  X-ray of the left hip showed comminuted intertrochanteric left hip fracture with varus angulation.  Patient is being admitted to the hospital for further treatment by orthopedics.  Review of Systems: As per HPI otherwise 10 point review of systems negative.    Past Medical History:  Diagnosis Date  . Hypertension   . Osteopenia     Past Surgical History:  Procedure Laterality Date  . CHOLECYSTECTOMY    . FRACTURE SURGERY     L hip 30 years ago     reports that she has quit smoking. She has never used smokeless tobacco. She reports that she  does not drink alcohol. No history on file for drug use.  Allergies  Allergen Reactions  . Lisinopril Cough  . Lidocaine Rash    Lidocaine patch    History reviewed. No pertinent family history.   Prior to Admission medications   Medication Sig Start Date End Date Taking? Authorizing Provider  cetirizine (ZYRTEC ALLERGY) 10 MG tablet Take 10 mg by mouth at bedtime.   Yes [provider]  chlorthalidone (HYGROTON) 50 MG tablet Take 50 mg by mouth daily. 12/28/19  Yes [provider]  losartan (COZAAR) 100 MG tablet Take 100 mg by mouth daily. 12/02/19  Yes [provider]  magnesium 30 MG tablet Take 30 mg by mouth daily.   Yes [provider]  metoprolol succinate (TOPROL-XL) 50 MG 24 hr tablet Take 50 mg by mouth every evening.  11/25/19  Yes [provider]    Physical Exam: Vitals:   01/24/20 0122 01/24/20 0137 01/24/20 0214 01/24/20 0500  BP: (!) 145/65 135/62 133/63 (!) 112/55  Pulse: 75 78 71 75  Resp: 12 14 16 17   Temp:  97.9 F (36.6 C) 97.7 F (36.5 C) 97.9 F (36.6 C)  TempSrc:   Oral Oral  SpO2: 99% 100% 99% 100%  Weight:      Height:          Constitutional: Acutely ill looking in pain Vitals:   01/24/20 0122 01/24/20 0137 01/24/20  0214 01/24/20 0500  BP: (!) 145/65 135/62 133/63 (!) 112/55  Pulse: 75 78 71 75  Resp: 12 14 16 17   Temp:  97.9 F (36.6 C) 97.7 F (36.5 C) 97.9 F (36.6 C)  TempSrc:   Oral Oral  SpO2: 99% 100% 99% 100%  Weight:      Height:       Eyes: PERRL, lids and conjunctivae normal ENMT: Mucous membranes are moist. Posterior pharynx clear of any exudate or lesions.Normal dentition.  Neck: normal, supple, no masses, no thyromegaly Respiratory: clear to auscultation bilaterally, no wheezing, no crackles. Normal respiratory effort. No accessory muscle use.  Cardiovascular: Regular rate and rhythm, no murmurs / rubs / gallops. No extremity edema. 2+ pedal pulses. No carotid bruits.    Abdomen: no tenderness, no masses palpated. No hepatosplenomegaly. Bowel sounds positive.  Musculoskeletal: no clubbing / cyanosis. No joint deformity upper and lower extremities.  Laterally rotated left hip with cchymosis  in the outside.   Skin: no rashes, lesions, ulcers. No induration Neurologic: CN 2-12 grossly intact. Sensation intact, DTR normal. Strength 5/5 in all 4.  Psychiatric: Normal judgment and insight. Alert and oriented x 3. Normal mood.     Labs on Admission: I have personally reviewed following labs and imaging studies  CBC: Recent Labs  Lab 01/23/20 1600 01/23/20 2048 01/24/20 0459  WBC 18.8* 13.4* 14.9*  NEUTROABS 17.2* 11.4*  --   HGB 13.3 12.6 11.4*  HCT 38.7 35.9* 33.4*  MCV 90.6 90.2 92.3  PLT 288 243 892   Basic Metabolic Panel: Recent Labs  Lab 01/23/20 1600 01/23/20 2048 01/24/20 0459  NA 133* 133* 134*  K 3.0* 3.3* 3.2*  CL 96* 98 98  CO2 22 24 25   GLUCOSE 157* 133* 149*  BUN 14 13 9   CREATININE 0.80 0.71 0.75  CALCIUM 9.2 8.4* 8.1*   GFR: Estimated Creatinine Clearance: 72.3 mL/min (by C-G formula based on SCr of 0.75 mg/dL). Liver Function Tests: Recent Labs  Lab 01/24/20 0459  AST 41  ALT 38  ALKPHOS 52  BILITOT 0.7  PROT 5.9*  ALBUMIN 3.4*   No results for input(s): LIPASE, AMYLASE in the last 168 hours. No results for input(s): AMMONIA in the last 168 hours. Coagulation Profile: Recent Labs  Lab 01/23/20 1600 01/23/20 2048  INR 1.1 1.0   Cardiac Enzymes: Recent Labs  Lab 01/23/20 2048  CKTOTAL 127   BNP (last 3 results) No results for input(s): PROBNP in the last 8760 hours. HbA1C: No results for input(s): HGBA1C in the last 72 hours. CBG: No results for input(s): GLUCAP in the last 168 hours. Lipid Profile: No results for input(s): CHOL, HDL, LDLCALC, TRIG, CHOLHDL, LDLDIRECT in the last 72 hours. Thyroid Function Tests: No results for input(s): TSH, T4TOTAL, FREET4, T3FREE, THYROIDAB in the last 72  hours. Anemia Panel: No results for input(s): VITAMINB12, FOLATE, FERRITIN, TIBC, IRON, RETICCTPCT in the last 72 hours. Urine analysis: No results found for: COLORURINE, APPEARANCEUR, LABSPEC, PHURINE, GLUCOSEU, HGBUR, BILIRUBINUR, KETONESUR, PROTEINUR, UROBILINOGEN, NITRITE, LEUKOCYTESUR Sepsis Labs: @LABRCNTIP (procalcitonin:4,lacticidven:4) ) Recent Results (from the past 240 hour(s))  Respiratory Panel by RT PCR (Flu A&B, Covid) - Nasopharyngeal Swab     Status: None   Collection Time: 01/23/20  3:51 PM   Specimen: Nasopharyngeal Swab  Result Value Ref Range Status   SARS Coronavirus 2 by RT PCR NEGATIVE NEGATIVE Final    Comment: (NOTE) SARS-CoV-2 target nucleic acids are NOT DETECTED.  The SARS-CoV-2 RNA is generally detectable in upper  respiratoy specimens during the acute phase of infection. The lowest concentration of SARS-CoV-2 viral copies this assay can detect is 131 copies/mL. A negative result does not preclude SARS-Cov-2 infection and should not be used as the sole basis for treatment or other patient management decisions. A negative result may occur with  improper specimen collection/handling, submission of specimen other than nasopharyngeal swab, presence of viral mutation(s) within the areas targeted by this assay, and inadequate number of viral copies (<131 copies/mL). A negative result must be combined with clinical observations, patient history, and epidemiological information. The expected result is Negative.  Fact Sheet for Patients:  PinkCheek.be  Fact Sheet for Healthcare Providers:  GravelBags.it  This test is no t yet approved or cleared by the [place] FDA and  has been authorized for detection and/or diagnosis of SARS-CoV-2 by FDA under an Emergency Use Authorization (EUA). This EUA will remain  in effect (meaning this test can be used) for the duration of the COVID-19 declaration under  Section 564(b)(1) of the Act, 21 U.S.C. section 360bbb-3(b)(1), unless the authorization is terminated or revoked sooner.     Influenza A by PCR NEGATIVE NEGATIVE Final   Influenza B by PCR NEGATIVE NEGATIVE Final    Comment: (NOTE) The Xpert Xpress SARS-CoV-2/FLU/RSV assay is intended as an aid in  the diagnosis of influenza from Nasopharyngeal swab specimens and  should not be used as a sole basis for treatment. Nasal washings and  aspirates are unacceptable for Xpert Xpress SARS-CoV-2/FLU/RSV  testing.  Fact Sheet for Patients: PinkCheek.be  Fact Sheet for Healthcare Providers: GravelBags.it  This test is not yet approved or cleared by the [place] FDA and  has been authorized for detection and/or diagnosis of SARS-CoV-2 by  FDA under an Emergency Use Authorization (EUA). This EUA will remain  in effect (meaning this test can be used) for the duration of the  Covid-19 declaration under Section 564(b)(1) of the Act, 21  U.S.C. section 360bbb-3(b)(1), unless the authorization is  terminated or revoked. Performed at [facility], [place] [address], [place], [place] [address]      Radiological Exams on Admission: DG Chest 1 View  Result Date: 01/23/2020 CLINICAL DATA:  [place], hip fracture, preoperative evaluation EXAM: CHEST  1 VIEW COMPARISON:  08/29/2015 FINDINGS: The heart size and mediastinal contours are within normal limits. Both lungs are clear. The visualized skeletal structures are unremarkable. IMPRESSION: No active disease. Electronically Signed   By: Randa Ngo M.D.   On: 01/23/2020 16:56   DG C-Arm 1-60 Min  Result Date: 01/24/2020 CLINICAL DATA:  Known left hip fracture EXAM: DG HIP (WITH OR WITHOUT PELVIS) 2-3V LEFT; DG C-ARM 1-60 MIN COMPARISON:  Film from earlier in the same day. FLUOROSCOPY TIME:  Fluoroscopy Time:  1 minutes 50 seconds Radiation Exposure Index (if provided by the fluoroscopic  device): 8.55 mGy Number of Acquired Spot Images: 8 FINDINGS: Initial images again demonstrate the proximal left femoral fracture. Fracture fragments have been nearly completely reduced. Medullary rod was then placed with 2 fixation screws proximally and 2 fixation screws distally. Fracture fragments are in near anatomic alignment. IMPRESSION: Status post ORIF of proximal left femoral fracture. Electronically Signed   By: Inez Catalina M.D.   On: 01/24/2020 00:55   DG HIP UNILAT WITH PELVIS 2-3 VIEWS LEFT  Result Date: 01/24/2020 CLINICAL DATA:  Known left hip fracture EXAM: DG HIP (WITH OR WITHOUT PELVIS) 2-3V LEFT; DG C-ARM 1-60 MIN COMPARISON:  Film from earlier in  the same day. FLUOROSCOPY TIME:  Fluoroscopy Time:  1 minutes 50 seconds Radiation Exposure Index (if provided by the fluoroscopic device): 8.55 mGy Number of Acquired Spot Images: 8 FINDINGS: Initial images again demonstrate the proximal left femoral fracture. Fracture fragments have been nearly completely reduced. Medullary rod was then placed with 2 fixation screws proximally and 2 fixation screws distally. Fracture fragments are in near anatomic alignment. IMPRESSION: Status post ORIF of proximal left femoral fracture. Electronically Signed   By: Inez Catalina M.D.   On: 01/24/2020 00:55   DG Hip Unilat With Pelvis 2-3 Views Left  Result Date: 01/23/2020 CLINICAL DATA:  [place] off stool EXAM: DG HIP (WITH OR WITHOUT PELVIS) 2-3V LEFT COMPARISON:  None. FINDINGS: Frontal view of the pelvis as well as frontal and frogleg lateral views of the left hip are obtained. There is a comminuted intertrochanteric left hip fracture with varus angulation at the fracture site. No dislocation. Remainder of the bony pelvis is unremarkable. The right hip is well aligned. IMPRESSION: 1. Comminuted inter trochanteric left hip fracture with varus angulation. Electronically Signed   By: Randa Ngo M.D.   On: 01/23/2020 16:51    EKG: Independently reviewed.   It shows normal sinus rhythm with right bundle branch block and LAFB.  Appears to have LVH by voltage criteria.  Unchanged from previous.  Assessment/Plan Principal Problem:   Displaced intertrochanteric fracture of left femur, initial encounter for closed fracture [facility]) Active Problems:   Essential hypertension     #1 left intertrochanteric fracture: Traumatic.  Patient is scheduled for surgery.  Patient is medically cleared for surgery no obvious reasons not to proceed.  E will keep n.p.o. after midnight.  Pain control consult continue per orthopedics.  2 status post fall: Appears mechanical in nature.  Will need PT and OT and disposition after surgery.  #3 hypertension: Resume home regimen.   DVT prophylaxis: SCD Code Status: Full code Family Communication: Daughter at bedside Disposition Plan: Home Consults called: Dr. Erlinda Hong, orthopedics Admission status: Inpatient  Severity of Illness: The appropriate patient status for this patient is INPATIENT. Inpatient status is judged to be reasonable and necessary in order to provide the required intensity of service to ensure the patient's safety. The patient's presenting symptoms, physical exam findings, and initial radiographic and laboratory data in the context of their chronic comorbidities is felt to place them at high risk for further clinical deterioration. Furthermore, it is not anticipated that the patient will be medically stable for discharge from the hospital within 2 midnights of admission. The following factors support the patient status of inpatient.   " The patient's presenting symptoms include left hip pain after a fall. " The worrisome physical exam findings include angulation of the left hip. " The initial radiographic and laboratory data are worrisome because of evidence of hip fracture. " The chronic co-morbidities include hypertension.   * I certify that at the point of admission it is my clinical judgment that the  patient will require inpatient hospital care spanning beyond 2 midnights from the point of admission due to high intensity of service, high risk for further deterioration and high frequency of surveillance required.Barbette Merino MD [facility] Pager [phone]  If 7PM-7AM, please contact night-coverage [email or web address] Password [facility]  01/24/2020, 8:03 AM

## 2020-01-24 ENCOUNTER — Encounter (HOSPITAL_COMMUNITY): Payer: Self-pay | Admitting: Internal Medicine

## 2020-01-24 ENCOUNTER — Inpatient Hospital Stay (HOSPITAL_COMMUNITY): Payer: BC Managed Care – PPO

## 2020-01-24 DIAGNOSIS — S72142A Displaced intertrochanteric fracture of left femur, initial encounter for closed fracture: Principal | ICD-10-CM

## 2020-01-24 DIAGNOSIS — S72002A Fracture of unspecified part of neck of left femur, initial encounter for closed fracture: Secondary | ICD-10-CM

## 2020-01-24 LAB — COMPREHENSIVE METABOLIC PANEL
ALT: 38 U/L (ref 0–44)
AST: 41 U/L (ref 15–41)
Albumin: 3.4 g/dL — ABNORMAL LOW (ref 3.5–5.0)
Alkaline Phosphatase: 52 U/L (ref 38–126)
Anion gap: 11 (ref 5–15)
BUN: 9 mg/dL (ref 8–23)
CO2: 25 mmol/L (ref 22–32)
Calcium: 8.1 mg/dL — ABNORMAL LOW (ref 8.9–10.3)
Chloride: 98 mmol/L (ref 98–111)
Creatinine, Ser: 0.75 mg/dL (ref 0.44–1.00)
GFR calc Af Amer: >60 mL/min (ref >60)
GFR calc non Af Amer: >60 mL/min (ref >60)
Glucose, Bld: 149 mg/dL — ABNORMAL HIGH (ref 70–99)
Potassium: 3.2 mmol/L — ABNORMAL LOW (ref 3.5–5.1)
Sodium: 134 mmol/L — ABNORMAL LOW (ref 135–145)
Total Bilirubin: 0.7 mg/dL (ref 0.3–1.2)
Total Protein: 5.9 g/dL — ABNORMAL LOW (ref 6.5–8.1)

## 2020-01-24 LAB — CBC
HCT: 33.4 % — ABNORMAL LOW (ref 36.0–46.0)
Hemoglobin: 11.4 g/dL — ABNORMAL LOW (ref 12.0–15.0)
MCH: 31.5 pg (ref 26.0–34.0)
MCHC: 34.1 g/dL (ref 30.0–36.0)
MCV: 92.3 fL (ref 80.0–100.0)
Platelets: 208 10*3/uL (ref 150–400)
RBC: 3.62 MIL/uL — ABNORMAL LOW (ref 3.87–5.11)
RDW: 12.2 % (ref 11.5–15.5)
WBC: 14.9 10*3/uL — ABNORMAL HIGH (ref 4.0–10.5)
nRBC: 0 % (ref 0.0–0.2)

## 2020-01-24 LAB — MAGNESIUM: Magnesium: 1.8 mg/dL (ref 1.7–2.4)

## 2020-01-24 MED ORDER — METHOCARBAMOL 500 MG PO TABS
ORAL_TABLET | ORAL | Status: AC
  Administered 2020-01-24: 500 mg
  Filled 2020-01-24: qty 1

## 2020-01-24 MED ORDER — ENOXAPARIN SODIUM 40 MG/0.4ML ~~LOC~~ SOLN: 40.0000 mg | Freq: Every day | SUBCUTANEOUS | 0 refills | Status: DC

## 2020-01-24 MED ORDER — OXYCODONE HCL 5 MG/5ML PO SOLN: 5.0000 mg | Freq: Once | ORAL | Status: AC | PRN

## 2020-01-24 MED ORDER — MIDAZOLAM HCL 2 MG/2ML IJ SOLN
0.5000 mg | Freq: Once | INTRAMUSCULAR | Status: DC | PRN
  Administered 2020-01-24: 1 mg via INTRAVENOUS

## 2020-01-24 MED ORDER — POLYETHYLENE GLYCOL 3350 17 G PO PACK
17.0000 g | PACK | Freq: Every day | ORAL | Status: DC
  Administered 2020-01-25 – 2020-01-29 (×3): 17 g via ORAL
  Filled 2020-01-24 (×7): qty 1

## 2020-01-24 MED ORDER — HYDROMORPHONE HCL 1 MG/ML IJ SOLN
INTRAMUSCULAR | Status: AC
  Filled 2020-01-24: qty 1

## 2020-01-24 MED ORDER — OXYCODONE HCL 5 MG PO TABS
5.0000 mg | ORAL_TABLET | Freq: Once | ORAL | Status: AC | PRN
  Administered 2020-01-24: 5 mg via ORAL

## 2020-01-24 MED ORDER — PROMETHAZINE HCL 25 MG/ML IJ SOLN: 6.2500 mg | INTRAMUSCULAR | Status: DC | PRN

## 2020-01-24 MED ORDER — HYDROMORPHONE HCL 1 MG/ML IJ SOLN
0.2500 mg | INTRAMUSCULAR | Status: DC | PRN
  Administered 2020-01-24 (×2): 0.5 mg via INTRAVENOUS
  Administered 2020-01-24: 1 mg via INTRAVENOUS
  Administered 2020-01-24: 0.5 mg via INTRAVENOUS

## 2020-01-24 MED ORDER — MIDAZOLAM HCL 2 MG/2ML IJ SOLN
INTRAMUSCULAR | Status: AC
  Filled 2020-01-24: qty 2

## 2020-01-24 MED ORDER — HYDROXYZINE HCL 10 MG PO TABS
10.0000 mg | ORAL_TABLET | ORAL | Status: DC | PRN
  Administered 2020-01-24 – 2020-01-29 (×13): 10 mg via ORAL
  Filled 2020-01-24 (×16): qty 1

## 2020-01-24 MED ORDER — BISACODYL 10 MG RE SUPP: 10.0000 mg | Freq: Every day | RECTAL | Status: DC | PRN

## 2020-01-24 MED ORDER — ACETAMINOPHEN 10 MG/ML IV SOLN
INTRAVENOUS | Status: AC
  Filled 2020-01-24: qty 100

## 2020-01-24 MED ORDER — OXYCODONE HCL 5 MG PO TABS
5.0000 mg | ORAL_TABLET | Freq: Once | ORAL | Status: AC
  Administered 2020-01-24: 5 mg via ORAL
  Filled 2020-01-24: qty 1

## 2020-01-24 MED ORDER — OXYCODONE HCL 5 MG PO TABS
ORAL_TABLET | ORAL | Status: AC
  Filled 2020-01-24: qty 1

## 2020-01-24 MED ORDER — MEPERIDINE HCL 25 MG/ML IJ SOLN: 6.2500 mg | INTRAMUSCULAR | Status: DC | PRN

## 2020-01-24 MED ORDER — POTASSIUM CHLORIDE CRYS ER 20 MEQ PO TBCR
40.0000 meq | EXTENDED_RELEASE_TABLET | Freq: Two times a day (BID) | ORAL | Status: AC
  Administered 2020-01-24 (×2): 40 meq via ORAL
  Filled 2020-01-24 (×2): qty 2

## 2020-01-24 MED ORDER — SUGAMMADEX SODIUM 200 MG/2ML IV SOLN
INTRAVENOUS | Status: DC | PRN
  Administered 2020-01-24: 200 mg via INTRAVENOUS

## 2020-01-24 MED ORDER — OXYCODONE-ACETAMINOPHEN 5-325 MG PO TABS: 1.0000 | ORAL_TABLET | Freq: Three times a day (TID) | ORAL | 0 refills | Status: DC | PRN

## 2020-01-24 NOTE — Op Note (Signed)
   Date of Surgery: 01/24/2020  INDICATIONS: Ms. Belleville is a 69 y.o.-year-old female who sustained a left hip fracture. The risks and benefits of the procedure discussed with the patient prior to the procedure and all questions were answered; consent was obtained.  PREOPERATIVE DIAGNOSIS: left pertrochanteric fracture   POSTOPERATIVE DIAGNOSIS: Same   PROCEDURE: Open treatment of subtrochanteric fracture with intramedullary implant. CPT [phone]   SURGEON: N. Eduard Roux, M.D.   ASSIST: Ciro Backer [place], [place]; necessary for the timely completion of procedure and due to complexity of procedure.  ANESTHESIA: general   IV FLUIDS AND URINE: See anesthesia record   ESTIMATED BLOOD LOSS: 250 cc  IMPLANTS: [employer] InterTAN 10 x 38, 85/80 lag screws  DRAINS: None.   COMPLICATIONS: see description of procedure.   DESCRIPTION OF PROCEDURE: The patient was brought to the operating room and placed supine on the operating table. The patient's leg had been signed prior to the procedure. The patient had the anesthesia placed by the anesthesiologist. The prep verification and incision time-outs were performed to confirm that this was the correct patient, site, side and location. The patient had an SCD on the opposite lower extremity. The patient did receive antibiotics prior to the incision and was re-dosed during the procedure as needed at indicated intervals. The patient was positioned on the fracture table with the table in traction and internal rotation to reduce the hip. The well leg was placed in a scissor position and all bony prominences were well-padded. The patient had the lower extremity prepped and draped in the standard surgical fashion. The incision was made 4 finger breadths superior to the greater trochanter. A guide pin was inserted into the tip of the greater trochanter under fluoroscopic guidance. An opening reamer was used to gain access to the femoral canal. The nail length  was measured and inserted down the femoral canal to its proper depth. The appropriate version of insertion for the lag screw was found under fluoroscopy. A pin was inserted up the femoral neck through the jig. Then, a second antirotation pin was inserted inferior to the first pin. The length of the lag screw was then measured. The lag screw was inserted as near to center-center in the head as possible. The antirotation pin was then taken out and an interdigitating compression screw was placed in its place. The leg was taken out of traction, then the interdigitating compression screw was used to compress across the fracture. Compression was visualized on serial xrays.  Two distal interlocking screws were placed using the perfect circle technique.  The wound was copiously irrigated with saline and the subcutaneous layer closed with 2.0 vicryl and the skin was reapproximated with staples. The wounds were cleaned and dried a final time and a sterile dressing was placed. The hip was taken through a range of motion at the end of the case under fluoroscopic imaging to visualize the approach-withdraw phenomenon and confirm implant length in the head. The patient was then awakened from anesthesia and taken to the recovery room in stable condition. All counts were correct at the end of the case.   POSTOPERATIVE PLAN: The patient will be 25% partial weight bearing and will return in 2 weeks for staple removal and the patient will receive DVT prophylaxis based on other medications, activity level, and risk ratio of bleeding to thrombosis.   Azucena Cecil, MD [facility] 12:02 AM

## 2020-01-24 NOTE — Progress Notes (Addendum)
Subjective: 1 Day Post-Op Procedure(s) (LRB): INTRAMEDULLARY (IM) NAIL INTERTROCHANTRIC (Left) Patient reports pain as mild.    Objective: Vital signs in last 24 hours: Temp:  [97.6 F (36.4 C)-98.8 F (37.1 C)] 97.9 F (36.6 C) (09/24 0500) Pulse Rate:  [70-80] 75 (09/24 0500) Resp:  [8-19] 17 (09/24 0500) BP: (112-166)/(55-100) 112/55 (09/24 0500) SpO2:  [91 %-100 %] 100 % (09/24 0500) Weight:  [82.6 kg] 82.6 kg (09/23 1513)  Intake/Output from previous day: 09/23 0701 - 09/24 0700 In: 2566.2 [P.O.:240; I.V.:1082.9; IV Piggyback:1243.3] Out: 775 [Urine:650; Blood:125] Intake/Output this shift: No intake/output data recorded.  Recent Labs    01/23/20 1600 01/23/20 2048 01/24/20 0459  HGB 13.3 12.6 11.4*   Recent Labs    01/23/20 2048 01/24/20 0459  WBC 13.4* 14.9*  RBC 3.98 3.62*  HCT 35.9* 33.4*  PLT 243 208   Recent Labs    01/23/20 2048 01/24/20 0459  NA 133* 134*  K 3.3* 3.2*  CL 98 98  CO2 24 25  BUN 13 9  CREATININE 0.71 0.75  GLUCOSE 133* 149*  CALCIUM 8.4* 8.1*   Recent Labs    01/23/20 1600 01/23/20 2048  INR 1.1 1.0    Neurologically intact Neurovascular intact Sensation intact distally Intact pulses distally Dorsiflexion/Plantar flexion intact Incision: dressing C/D/I No cellulitis present Compartment soft   Assessment/Plan: 1 Day Post-Op Procedure(s) (LRB): INTRAMEDULLARY (IM) NAIL INTERTROCHANTRIC (Left) Up with therapy  WBAT LLE ABLA- mild and stable Lovenox/SCDs for dvt ppx F/u with Dr. Erlinda Hong 2 weeks post-op for suture removal       Aundra Dubin 01/24/2020, 8:48 AM

## 2020-01-24 NOTE — Plan of Care (Signed)
  Problem: Education: Goal: Knowledge of General Education information will improve Description: Including pain rating scale, medication(s)/side effects and non-pharmacologic comfort measures Outcome: Progressing   Problem: Clinical Measurements: Goal: Ability to maintain clinical measurements within normal limits will improve Outcome: Progressing   

## 2020-01-24 NOTE — Progress Notes (Signed)
PROGRESS NOTE    Kimberly Whitney  LJQ:[id number] DOB: 1951/02/24 DOA: 01/23/2020 PCP: Pcp, No    Brief Narrative:  69 y.o. female with medical history significant of hypertension who was in her kitchen got on a stool ladder and missed her steps.  She fell and landed on her left side.  Patient sustained a left femoral fracture.  Seen in the ED and evaluated.  She was subsequently admitted to the hospital after orthopedic consult.  She had no known history of coronary artery disease.  No chest pain.  Patient denied any fever or chills.  No other injuries.  It was purely mechanical fall.  Patient seen by EMS and brought to the hospital where she was found to have the fracture.  No other complaint.  She has had previous left hip fracture with temporary hardware that was removed many years ago.  Dr. Sherrian Divers has seen the patient and plan surgery early tomorrow morning and we are admitting the patient for further work-up..  ED Course: Temperature 98.8 blood pressure 160s over 77 pulse 80 respiratory rate 19 oxygen sat 91% room air.  White count 14.9 hemoglobin 11.4 and platelets 243.  Sodium 133 potassium 3.3 chloride 98 and creatinine 0.71.  COVID-19 screen is negative.  Chest x-ray showed no acute findings.  X-ray of the left hip showed comminuted intertrochanteric left hip fracture with varus angulation.  Patient is being admitted to the hospital for further treatment by orthopedics.  Assessment & Plan:   Principal Problem:   Displaced intertrochanteric fracture of left femur, initial encounter for closed fracture [facility]) Active Problems:   Essential hypertension   #1 left intertrochanteric fracture:  -s/p mechanical fall -Orthopedic Surgery was consulted and pt is now s/p surgery 9/24 -PT/OT consulted -Will cont with analgesia as needed  2 status post fall:  -Appears mechanical in nature.   -PT/OT consulted per above  #3 hypertension:  -BP stable at present -Continue home regimen as  tolerated  DVT prophylaxis: SCD's Code Status: Full Family Communication: Pt in room, family at bedside  Status is: Inpatient  Remains inpatient appropriate because:Unsafe d/c plan and Inpatient level of care appropriate due to severity of illness   Dispo: The patient is from: Home              Anticipated d/c is to: Unclear at this time. Pending PT/OT eval              Anticipated d/c date is: 3 days              Patient currently is not medically stable to d/c.       Consultants:   Orthopedic Surgery  Procedures:   Open treatment of subtrochanteric fx with intramedullary implant 9/24  Antimicrobials: Anti-infectives (From admission, onward)   Start     Dose/Rate Route Frequency Ordered Stop   01/23/20 2115  ceFAZolin (ANCEF) IVPB 2g/100 mL premix  Status:  Discontinued        2 g 200 mL/hr over 30 Minutes Intravenous On call to O.R. 01/23/20 2023 01/23/20 2047   01/23/20 2115  ceFAZolin (ANCEF) IVPB 2g/100 mL premix        2 g 200 mL/hr over 30 Minutes Intravenous On call to O.R. 01/23/20 2046 01/23/20 2243       Subjective: Complaining of continued L hip pain  Objective: Vitals:   01/24/20 0137 01/24/20 0214 01/24/20 0500 01/24/20 1440  BP: 135/62 133/63 (!) 112/55 (!) 121/58  Pulse: 78 71 75 73  Resp: 14 16 17 16   Temp: 97.9 F (36.6 C) 97.7 F (36.5 C) 97.9 F (36.6 C) 98.4 F (36.9 C)  TempSrc:  Oral Oral Oral  SpO2: 100% 99% 100% 93%  Weight:      Height:        Intake/Output Summary (Last 24 hours) at 01/24/2020 1618 Last data filed at 01/24/2020 0300 Gross per 24 hour  Intake 2566.23 ml  Output 775 ml  Net 1791.23 ml   Filed Weights   01/23/20 1513  Weight: 82.6 kg    Examination:  General exam: Appears calm and comfortable  Respiratory system: Clear to auscultation. Respiratory effort normal. Cardiovascular system: S1 & S2 heard, Regular Gastrointestinal system: Abdomen is nondistended, soft and nontender. No organomegaly or  masses felt. Normal bowel sounds heard. Central nervous system: Alert and oriented. No focal neurological deficits. Extremities: Symmetric 5 x 5 power. Skin: No rashes, lesions Psychiatry: Judgement and insight appear normal. Mood & affect appropriate.   Data Reviewed: I have personally reviewed following labs and imaging studies  CBC: Recent Labs  Lab 01/23/20 1600 01/23/20 2048 01/24/20 0459  WBC 18.8* 13.4* 14.9*  NEUTROABS 17.2* 11.4*  --   HGB 13.3 12.6 11.4*  HCT 38.7 35.9* 33.4*  MCV 90.6 90.2 92.3  PLT 288 243 867   Basic Metabolic Panel: Recent Labs  Lab 01/23/20 1600 01/23/20 2048 01/24/20 0459  NA 133* 133* 134*  K 3.0* 3.3* 3.2*  CL 96* 98 98  CO2 22 24 25   GLUCOSE 157* 133* 149*  BUN 14 13 9   CREATININE 0.80 0.71 0.75  CALCIUM 9.2 8.4* 8.1*  MG  --   --  1.8   GFR: Estimated Creatinine Clearance: 72.3 mL/min (by C-G formula based on SCr of 0.75 mg/dL). Liver Function Tests: Recent Labs  Lab 01/24/20 0459  AST 41  ALT 38  ALKPHOS 52  BILITOT 0.7  PROT 5.9*  ALBUMIN 3.4*   No results for input(s): LIPASE, AMYLASE in the last 168 hours. No results for input(s): AMMONIA in the last 168 hours. Coagulation Profile: Recent Labs  Lab 01/23/20 1600 01/23/20 2048  INR 1.1 1.0   Cardiac Enzymes: Recent Labs  Lab 01/23/20 2048  CKTOTAL 127   BNP (last 3 results) No results for input(s): PROBNP in the last 8760 hours. HbA1C: No results for input(s): HGBA1C in the last 72 hours. CBG: No results for input(s): GLUCAP in the last 168 hours. Lipid Profile: No results for input(s): CHOL, HDL, LDLCALC, TRIG, CHOLHDL, LDLDIRECT in the last 72 hours. Thyroid Function Tests: No results for input(s): TSH, T4TOTAL, FREET4, T3FREE, THYROIDAB in the last 72 hours. Anemia Panel: No results for input(s): VITAMINB12, FOLATE, FERRITIN, TIBC, IRON, RETICCTPCT in the last 72 hours. Sepsis Labs: No results for input(s): PROCALCITON, LATICACIDVEN in the last  168 hours.  Recent Results (from the past 240 hour(s))  Respiratory Panel by RT PCR (Flu A&B, Covid) - Nasopharyngeal Swab     Status: None   Collection Time: 01/23/20  3:51 PM   Specimen: Nasopharyngeal Swab  Result Value Ref Range Status   SARS Coronavirus 2 by RT PCR NEGATIVE NEGATIVE Final    Comment: (NOTE) SARS-CoV-2 target nucleic acids are NOT DETECTED.  The SARS-CoV-2 RNA is generally detectable in upper respiratoy specimens during the acute phase of infection. The lowest concentration of SARS-CoV-2 viral copies this assay can detect is 131 copies/mL. A negative result does not preclude SARS-Cov-2 infection and should not be used as the sole  basis for treatment or other patient management decisions. A negative result may occur with  improper specimen collection/handling, submission of specimen other than nasopharyngeal swab, presence of viral mutation(s) within the areas targeted by this assay, and inadequate number of viral copies (<131 copies/mL). A negative result must be combined with clinical observations, patient history, and epidemiological information. The expected result is Negative.  Fact Sheet for Patients:  PinkCheek.be  Fact Sheet for Healthcare Providers:  GravelBags.it  This test is no t yet approved or cleared by the [place] FDA and  has been authorized for detection and/or diagnosis of SARS-CoV-2 by FDA under an Emergency Use Authorization (EUA). This EUA will remain  in effect (meaning this test can be used) for the duration of the COVID-19 declaration under Section 564(b)(1) of the Act, 21 U.S.C. section 360bbb-3(b)(1), unless the authorization is terminated or revoked sooner.     Influenza A by PCR NEGATIVE NEGATIVE Final   Influenza B by PCR NEGATIVE NEGATIVE Final    Comment: (NOTE) The Xpert Xpress SARS-CoV-2/FLU/RSV assay is intended as an aid in  the diagnosis of influenza from  Nasopharyngeal swab specimens and  should not be used as a sole basis for treatment. Nasal washings and  aspirates are unacceptable for Xpert Xpress SARS-CoV-2/FLU/RSV  testing.  Fact Sheet for Patients: PinkCheek.be  Fact Sheet for Healthcare Providers: GravelBags.it  This test is not yet approved or cleared by the [place] FDA and  has been authorized for detection and/or diagnosis of SARS-CoV-2 by  FDA under an Emergency Use Authorization (EUA). This EUA will remain  in effect (meaning this test can be used) for the duration of the  Covid-19 declaration under Section 564(b)(1) of the Act, 21  U.S.C. section 360bbb-3(b)(1), unless the authorization is  terminated or revoked. Performed at [facility], [place] [address] [place], [place] [address]      Radiology Studies: DG Chest 1 View  Result Date: 01/23/2020 CLINICAL DATA:  [place], hip fracture, preoperative evaluation EXAM: CHEST  1 VIEW COMPARISON:  08/29/2015 FINDINGS: The heart size and mediastinal contours are within normal limits. Both lungs are clear. The visualized skeletal structures are unremarkable. IMPRESSION: No active disease. Electronically Signed   By: Randa Ngo M.D.   On: 01/23/2020 16:56   DG C-Arm 1-60 Min  Result Date: 01/24/2020 CLINICAL DATA:  Known left hip fracture EXAM: DG HIP (WITH OR WITHOUT PELVIS) 2-3V LEFT; DG C-ARM 1-60 MIN COMPARISON:  Film from earlier in the same day. FLUOROSCOPY TIME:  Fluoroscopy Time:  1 minutes 50 seconds Radiation Exposure Index (if provided by the fluoroscopic device): 8.55 mGy Number of Acquired Spot Images: 8 FINDINGS: Initial images again demonstrate the proximal left femoral fracture. Fracture fragments have been nearly completely reduced. Medullary rod was then placed with 2 fixation screws proximally and 2 fixation screws distally. Fracture fragments are in near anatomic alignment. IMPRESSION: Status  post ORIF of proximal left femoral fracture. Electronically Signed   By: Inez Catalina M.D.   On: 01/24/2020 00:55   DG HIP UNILAT WITH PELVIS 2-3 VIEWS LEFT  Result Date: 01/24/2020 CLINICAL DATA:  Known left hip fracture EXAM: DG HIP (WITH OR WITHOUT PELVIS) 2-3V LEFT; DG C-ARM 1-60 MIN COMPARISON:  Film from earlier in the same day. FLUOROSCOPY TIME:  Fluoroscopy Time:  1 minutes 50 seconds Radiation Exposure Index (if provided by the fluoroscopic device): 8.55 mGy Number of Acquired Spot Images: 8 FINDINGS: Initial images again demonstrate the proximal left femoral fracture.  Fracture fragments have been nearly completely reduced. Medullary rod was then placed with 2 fixation screws proximally and 2 fixation screws distally. Fracture fragments are in near anatomic alignment. IMPRESSION: Status post ORIF of proximal left femoral fracture. Electronically Signed   By: Inez Catalina M.D.   On: 01/24/2020 00:55   DG Hip Unilat With Pelvis 2-3 Views Left  Result Date: 01/23/2020 CLINICAL DATA:  [place] off stool EXAM: DG HIP (WITH OR WITHOUT PELVIS) 2-3V LEFT COMPARISON:  None. FINDINGS: Frontal view of the pelvis as well as frontal and frogleg lateral views of the left hip are obtained. There is a comminuted intertrochanteric left hip fracture with varus angulation at the fracture site. No dislocation. Remainder of the bony pelvis is unremarkable. The right hip is well aligned. IMPRESSION: 1. Comminuted inter trochanteric left hip fracture with varus angulation. Electronically Signed   By: Randa Ngo M.D.   On: 01/23/2020 16:51    Scheduled Meds:  chlorthalidone  50 mg Oral Daily   loratadine  10 mg Oral Daily   losartan  100 mg Oral Daily   metoprolol succinate  50 mg Oral QPM   polyethylene glycol  17 g Oral Daily   potassium chloride  40 mEq Oral BID   Continuous Infusions:  sodium chloride 125 mL/hr at 01/23/20 1711   sodium chloride 75 mL/hr at 01/24/20 1505     LOS: 1 day    Marylu Lund, MD [facility] Pager On Amion  If 7PM-7AM, please contact night-coverage 01/24/2020, 4:18 PM

## 2020-01-24 NOTE — Plan of Care (Signed)
  Problem: Education: Goal: Knowledge of General Education information will improve Description: Including pain rating scale, medication(s)/side effects and non-pharmacologic comfort measures Outcome: Progressing   Problem: Health Behavior/Discharge Planning: Goal: Ability to manage health-related needs will improve Outcome: Progressing   Problem: Clinical Measurements: Goal: Ability to maintain clinical measurements within normal limits will improve Outcome: Progressing   Problem: Activity: Goal: Risk for activity intolerance will decrease Outcome: Progressing   Problem: Safety: Goal: Ability to remain free from injury will improve Outcome: Progressing   Problem: Skin Integrity: Goal: Risk for impaired skin integrity will decrease Outcome: Progressing

## 2020-01-24 NOTE — Transfer of Care (Signed)
Immediate Anesthesia Transfer of Care Note  Patient: Kimberly Whitney  Procedure(s) Performed: INTRAMEDULLARY (IM) NAIL INTERTROCHANTRIC (Left Hip)  Patient Location: PACU  Anesthesia Type:General  Level of Consciousness: awake, alert  and oriented  Airway & Oxygen Therapy: Patient Spontanous Breathing  Post-op Assessment: Report given to RN and Post -op Vital signs reviewed and stable  Post vital signs: Reviewed and stable  Last Vitals:  Vitals Value Taken Time  BP    Temp    Pulse 81 01/24/20 0034  Resp 15 01/24/20 0034  SpO2 95 % 01/24/20 0034  Vitals shown include unvalidated device data.  Last Pain:  Vitals:   01/23/20 2026  TempSrc: Oral  PainSc:          Complications: No complications documented.

## 2020-01-24 NOTE — Evaluation (Addendum)
Physical Therapy Evaluation Patient Details Name: Kimberly Whitney MRN: [id number] DOB: 11-05-1950 Today's Date: 01/24/2020   History of Present Illness  Pt is a 69 y.o. F with significant PMH of hypertension, osteopenia, and L hip fracture 30 years ago who presents after a fall wtih a left femoral fracture now s/p open treatment with intramedullary implant.    Clinical Impression  Prior to admission, pt lives with their spouse and is independent. Pt presents with decreased functional mobility secondary to LLE pain, weakness, decreased ROM, and fear of movement. Pt requiring maximal assist for bed mobility and moderate assist to rise to standing with a walker today. Rest of session focused on bed level exercises for LLE ROM/strengthening. Pt reports being "in her head," and she is highly resistive of movement which is likely contributing to increased pain levels. However, she is very motivated, and based on her PLOF and age, I think she will progress well. Currently recommending CIR to address deficits, maximize functional independence and decrease caregiver burden.     Follow Up Recommendations CIR    Equipment Recommendations  Rolling walker with 5" wheels;3in1 (PT)    Recommendations for Other Services       Precautions / Restrictions Precautions Precautions: Fall;Other (comment) Precaution Comments: Anxious with mobility Restrictions Weight Bearing Restrictions: Yes LLE Weight Bearing: PWB 25%     Mobility  Bed Mobility Overal bed mobility: Needs Assistance Bed Mobility: Supine to Sit;Sit to Supine     Supine to sit: Max assist Sit to supine: Mod assist   General bed mobility comments: MaxA for management of LLE and trunk to upright; use of bed rail and HOB elevated. Rested LLE on stool and gently lowered to ground. ModA for BLE negotiation back into bed   Transfers Overall transfer level: Needs assistance Equipment used: Rolling walker (2 wheeled) Transfers: Sit to/from  Stand Sit to Stand: Mod assist         General transfer comment: Requiring modA to boost into upright position from elevated bed height; max cues/demonstration for hand and foot placement and rocking forward to gain momentum. Pt able to "shimmy," right foot up towards head of bed; manual assist provided for walker placement.  Ambulation/Gait                Stairs            Wheelchair Mobility    Modified Rankin (Stroke Patients Only)       Balance Overall balance assessment: Needs assistance Sitting-balance support: Feet supported;Bilateral upper extremity supported Sitting balance-Leahy Scale: Poor Sitting balance - Comments: reliant on BUE support   Standing balance support: Bilateral upper extremity supported Standing balance-Leahy Scale: Poor Standing balance comment: reliant on BUE support                             Pertinent Vitals/Pain Pain Assessment: Faces Faces Pain Scale: Hurts worst Pain Location: L hip Pain Descriptors / Indicators: Sharp;Operative site guarding;Grimacing;Throbbing Pain Intervention(s): Monitored during session;Limited activity within patient's tolerance;Premedicated before session;Repositioned;Ice applied    Home Living Family/patient expects to be discharged to:: Private residence Living Arrangements: Spouse/significant other Available Help at Discharge: Family Type of Home: House Home Access: Stairs to enter Entrance Stairs-Rails: None Entrance Stairs-Number of Steps: 2 Home Layout: One level Home Equipment: Wheelchair - manual      Prior Function Level of Independence: Independent  Hand Dominance        Extremity/Trunk Assessment   Upper Extremity Assessment Upper Extremity Assessment: Overall WFL for tasks assessed    Lower Extremity Assessment Lower Extremity Assessment: RLE deficits/detail;LLE deficits/detail RLE Deficits / Details: WFL LLE Deficits / Details: Grossly  2/5, resistive of movement due to pain    Cervical / Trunk Assessment Cervical / Trunk Assessment: Normal  Communication   Communication: No difficulties  Cognition Arousal/Alertness: Awake/alert Behavior During Therapy: Anxious Overall Cognitive Status: Within Functional Limits for tasks assessed                                        General Comments      Exercises General Exercises - Lower Extremity Ankle Circles/Pumps: Both;10 reps;Supine Quad Sets: Both;10 reps;Supine Gluteal Sets: Both;10 reps;Supine Heel Slides: AAROM;Left;Supine;Seated;20 reps Hip ABduction/ADduction: AAROM;5 reps;Supine   Assessment/Plan    PT Assessment Patient needs continued PT services  PT Problem List Decreased strength;Decreased activity tolerance;Decreased range of motion;Decreased balance;Decreased mobility;Pain       PT Treatment Interventions DME instruction;Gait training;Stair training;Functional mobility training;Therapeutic activities;Therapeutic exercise;Balance training;Patient/family education    PT Goals (Current goals can be found in the Care Plan section)  Acute Rehab PT Goals Patient Stated Goal: less pain, return home PT Goal Formulation: With patient/family Time For Goal Achievement: 02/07/20 Potential to Achieve Goals: Good    Frequency Min 5X/week   Barriers to discharge        Co-evaluation               AM-PAC PT "6 Clicks" Mobility  Outcome Measure Help needed turning from your back to your side while in a flat bed without using bedrails?: Total Help needed moving from lying on your back to sitting on the side of a flat bed without using bedrails?: Total Help needed moving to and from a bed to a chair (including a wheelchair)?: Total Help needed standing up from a chair using your arms (e.g., wheelchair or bedside chair)?: A Lot Help needed to walk in hospital room?: Total Help needed climbing 3-5 steps with a railing? : Total 6 Click  Score: 7    End of Session Equipment Utilized During Treatment: Gait belt Activity Tolerance: Patient tolerated treatment well Patient left: in bed;with call bell/phone within reach;with family/visitor present Nurse Communication: Mobility status PT Visit Diagnosis: Pain;Other abnormalities of gait and mobility (R26.89);Difficulty in walking, not elsewhere classified (R26.2) Pain - Right/Left: Left Pain - part of body: Hip    Time: 4540-9811 PT Time Calculation (min) (ACUTE ONLY): 58 min   Charges:   PT Evaluation $PT Eval Moderate Complexity: 1 Mod PT Treatments $Therapeutic Exercise: 8-22 mins $Therapeutic Activity: 23-37 mins          Wyona Almas, PT, DPT Acute Rehabilitation Services Pager [phone] Office [phone]   Deno Etienne 01/24/2020, 5:30 PM

## 2020-01-24 NOTE — Discharge Instructions (Signed)
° ° °  1. Change dressings as needed °2. May shower but keep incisions covered and dry °3. Take lovenox to prevent blood clots °4. Take stool softeners as needed °5. Take pain meds as needed ° °

## 2020-01-24 NOTE — Anesthesia Postprocedure Evaluation (Signed)
Anesthesia Post Note  Patient: Kimberly Whitney  Procedure(s) Performed: INTRAMEDULLARY (IM) NAIL INTERTROCHANTRIC (Left Hip)     Patient location during evaluation: PACU Anesthesia Type: General Level of consciousness: awake and alert, patient cooperative and oriented Pain management: pain level controlled Vital Signs Assessment: post-procedure vital signs reviewed and stable Respiratory status: spontaneous breathing, nonlabored ventilation, respiratory function stable and patient connected to nasal cannula oxygen Cardiovascular status: blood pressure returned to baseline and stable Postop Assessment: no apparent nausea or vomiting Anesthetic complications: no   No complications documented.  Last Vitals:  Vitals:   01/24/20 0037 01/24/20 0052  BP: (!) 166/72 (!) 139/100  Pulse: 80 78  Resp: 14 18  Temp:    SpO2: 98% 97%    Last Pain:  Vitals:   01/23/20 2026  TempSrc: Oral  PainSc:                  Mithcell Schumpert,E. Deepti Gunawan

## 2020-01-25 MED ORDER — CYCLOBENZAPRINE HCL 5 MG PO TABS
5.0000 mg | ORAL_TABLET | Freq: Three times a day (TID) | ORAL | Status: DC | PRN
  Administered 2020-01-25 – 2020-01-29 (×11): 5 mg via ORAL
  Filled 2020-01-25 (×11): qty 1

## 2020-01-25 MED ORDER — METHOCARBAMOL 500 MG PO TABS
500.0000 mg | ORAL_TABLET | Freq: Three times a day (TID) | ORAL | Status: DC | PRN
  Administered 2020-01-25: 500 mg via ORAL
  Filled 2020-01-25 (×2): qty 1

## 2020-01-25 NOTE — Evaluation (Signed)
Occupational Therapy Evaluation Patient Details Name: Kimberly Whitney MRN: [id number] DOB: Feb 04, 1951 Today's Date: 01/25/2020    History of Present Illness Pt is a 69 y.o. F with significant PMH of hypertension, osteopenia, and L hip fracture 30 years ago who presents after a fall wtih a left femoral fracture now s/p open treatment with intramedullary implant.     Clinical Impression   Pt. Is having significant pain with muscle spasm that is limiting her ADLs and mobility. Pt. Is requiring Max to Dep with LE ADLs. Pt. Was Max a with bed mobiltiy and was not able to stand secondary to pain. Pt. Will need to improve with mobility to dc home. Pt. Wants to dc to CIR and they are evaluating her. If she does not go to CIR she may need snf unless mobility improves.    Follow Up Recommendations  SNF;Supervision/Assistance - 24 hour;CIR    Equipment Recommendations  3 in 1 bedside commode    Recommendations for Other Services       Precautions / Restrictions Precautions Precautions: Fall;Other (comment) Precaution Comments: Anxious with mobility Restrictions Weight Bearing Restrictions: Yes LLE Weight Bearing: Weight bearing as tolerated      Mobility Bed Mobility         Supine to sit: Max assist Sit to supine: Max assist   General bed mobility comments: MaxA for management of LLE and trunk to upright; use of bed rail and HOB elevated. Rested LLE on stool and gently lowered to ground. ModA for BLE negotiation back into bed   Transfers                 General transfer comment: Pt. refused to attempt to stand she was having muscle spasm    Balance     Sitting balance-Leahy Scale: Poor                                     ADL either performed or assessed with clinical judgement   ADL Overall ADL's : Needs assistance/impaired Eating/Feeding: Independent   Grooming: Wash/dry hands;Wash/dry face;Sitting;Min guard   Upper Body Bathing: Sitting;Minimal  assistance   Lower Body Bathing: Maximal assistance;Bed level   Upper Body Dressing : Minimal assistance;Sitting   Lower Body Dressing: Total assistance;Bed level               Functional mobility during ADLs:  (Pt. would not attempt sitting EOB) General ADL Comments: Pt. sat EOB CGA level but would not attmept to stand.      Vision Baseline Vision/History: Wears glasses Wears Glasses: At all times Patient Visual Report: No change from baseline       Perception     Praxis      Pertinent Vitals/Pain Pain Assessment: 0-10 Pain Score: 3  Pain Location: L hip Pain Descriptors / Indicators: Aching;Operative site guarding Pain Intervention(s): Monitored during session;Premedicated before session     Hand Dominance Right   Extremity/Trunk Assessment Upper Extremity Assessment Upper Extremity Assessment: Overall WFL for tasks assessed   Lower Extremity Assessment Lower Extremity Assessment: Defer to PT evaluation   Cervical / Trunk Assessment Cervical / Trunk Assessment: Normal   Communication Communication Communication: No difficulties   Cognition Arousal/Alertness: Awake/alert Behavior During Therapy: Anxious Overall Cognitive Status: Within Functional Limits for tasks assessed  General Comments       Exercises     Shoulder Instructions      Home Living Family/patient expects to be discharged to:: Private residence Living Arrangements: Spouse/significant other Available Help at Discharge: Family Type of Home: House Home Access: Stairs to enter Technical brewer of Steps: 1 Entrance Stairs-Rails: None Home Layout: One level     Bathroom Shower/Tub: Occupational psychologist: Handicapped height Bathroom Accessibility: Yes How Accessible: Accessible via walker [place]: Wheelchair - manual;Shower seat - built in      Lives With: Spouse    Prior Functioning/Environment  Level of Independence: Independent                 OT Problem List: Decreased activity tolerance;Impaired balance (sitting and/or standing);Decreased knowledge of use of DME or AE      OT Treatment/Interventions: Self-care/ADL training;DME and/or AE instruction;Therapeutic activities;Patient/family education    OT Goals(Current goals can be found in the care plan section) Acute Rehab OT Goals Patient Stated Goal: less pain, return home OT Goal Formulation: With patient Time For Goal Achievement: 02/08/20 Potential to Achieve Goals: Good ADL Goals Pt Will Perform Grooming: standing;with modified independence Pt Will Perform Upper Body Bathing: with modified independence;sitting Pt Will Perform Lower Body Bathing: with modified independence;sit to/from stand Pt Will Perform Upper Body Dressing: with modified independence;sitting Pt Will Perform Lower Body Dressing: with modified independence;sit to/from stand Pt Will Transfer to Toilet: with modified independence;ambulating Pt Will Perform Toileting - Clothing Manipulation and hygiene: with modified independence;sit to/from stand Pt Will Perform Tub/Shower Transfer: with supervision;ambulating  OT Frequency: Min 2X/week   Barriers to D/C: Decreased caregiver support          Co-evaluation              AM-PAC OT "6 Clicks" Daily Activity     Outcome Measure Help from another person eating meals?: None Help from another person taking care of personal grooming?: A Little Help from another person toileting, which includes using toliet, bedpan, or urinal?: Total Help from another person bathing (including washing, rinsing, drying)?: A Lot Help from another person to put on and taking off regular upper body clothing?: A Little Help from another person to put on and taking off regular lower body clothing?: Total 6 Click Score: 14   End of Session Nurse Communication:  (ok therapy)  Activity Tolerance: Patient limited by  pain Patient left: in bed;with call bell/phone within reach;with bed alarm set  OT Visit Diagnosis: Unsteadiness on feet (R26.81);History of falling (Z91.81)                Time: 3500-9381 OT Time Calculation (min): 38 min Charges:  OT General Charges $OT Visit: 1 Visit OT Evaluation $OT Eval Low Complexity: 1 Low OT Treatments $Self Care/Home Management : 8-22 mins  Reece Packer OT/L   Bradleigh Sonnen 01/25/2020, 1:21 PM

## 2020-01-25 NOTE — Progress Notes (Signed)
Inpatient Rehab Admissions:  Inpatient Rehab Consult received.  I met with pt and husband at the bedside for rehabilitation assessment and to discuss goals and expectations of an inpatient rehab admission. Pt is interested in CIR.  Pt acknowledged understanding of CIR goals and expectations.  Will continue to follow pt's progress with therapies and medical workup.  Signed: Gayland Curry, [place], [place] Admissions Coordinator [phone]

## 2020-01-25 NOTE — Progress Notes (Addendum)
Physical Therapy Treatment Patient Details Name: Kimberly Whitney MRN: [id number] DOB: 03-15-1951 Today's Date: 01/25/2020    History of Present Illness Pt is a 69 y.o. F with significant PMH of hypertension, osteopenia, and L hip fracture 30 years ago who presents after a fall wtih a left femoral fracture now s/p open treatment with intramedullary implant.      PT Comments    Pt more limited by pain and muscle spasms today despite premedication (including Robaxin). Agreeable to bed level exercises (see below) for BLE ROM and strengthening. Further mobility deferred as pt with 10/10 pain and spasm with attempts at progressing edge of bed. Ice applied. Suspect with pain control and management, pt will progress well based on PLOF, age, and motivation. Continue to recommend comprehensive inpatient rehab (CIR) for post-acute therapy needs.     Follow Up Recommendations  CIR     Equipment Recommendations  Rolling walker with 5" wheels;3in1 (PT)    Recommendations for Other Services       Precautions / Restrictions Precautions Precautions: Fall;Other (comment) Precaution Comments: Anxious with mobility Restrictions Weight Bearing Restrictions: Yes LLE Weight Bearing: PWB 25%   Mobility  Bed Mobility                  Transfers                    Ambulation/Gait                 Stairs             Wheelchair Mobility    Modified Rankin (Stroke Patients Only)       Balance                                            Cognition Arousal/Alertness: Awake/alert Behavior During Therapy: Anxious Overall Cognitive Status: Within Functional Limits for tasks assessed                                        Exercises General Exercises - Lower Extremity Ankle Circles/Pumps: Both;Supine Quad Sets: Both;Supine;15 reps Gluteal Sets: Both;Supine;15 reps Short Arc Quad: Left;10 reps;Supine;AAROM Heel Slides:  AAROM;Left;Supine;10 reps Hip ABduction/ADduction: AAROM;Supine;Both;AROM;10 reps Straight Leg Raises: Right;10 reps;Supine    General Comments        Pertinent Vitals/Pain Pain Assessment: Faces Faces Pain Scale: Hurts worst Pain Location: L hip Pain Descriptors / Indicators: Sharp;Operative site guarding;Grimacing;Throbbing;Spasm Pain Intervention(s): Limited activity within patient's tolerance;Monitored during session;Ice applied;Premedicated before session    Home Living                      Prior Function            PT Goals (current goals can now be found in the care plan section) Acute Rehab PT Goals Patient Stated Goal: less pain, return home PT Goal Formulation: With patient/family Time For Goal Achievement: 02/07/20 Potential to Achieve Goals: Good    Frequency    Min 5X/week      PT Plan Current plan remains appropriate    Co-evaluation              AM-PAC PT "6 Clicks" Mobility   Outcome Measure  Help needed turning from your back to your side while  in a flat bed without using bedrails?: Total Help needed moving from lying on your back to sitting on the side of a flat bed without using bedrails?: Total Help needed moving to and from a bed to a chair (including a wheelchair)?: Total Help needed standing up from a chair using your arms (e.g., wheelchair or bedside chair)?: A Lot Help needed to walk in hospital room?: Total Help needed climbing 3-5 steps with a railing? : Total 6 Click Score: 7    End of Session Equipment Utilized During Treatment: Gait belt Activity Tolerance: Patient tolerated treatment well Patient left: in bed;with call bell/phone within reach;with family/visitor present Nurse Communication: Mobility status PT Visit Diagnosis: Pain;Other abnormalities of gait and mobility (R26.89);Difficulty in walking, not elsewhere classified (R26.2) Pain - Right/Left: Left Pain - part of body: Hip     Time: 6825-7493 PT  Time Calculation (min) (ACUTE ONLY): 25 min  Charges:  $Therapeutic Exercise: 23-37 mins                       Kimberly Whitney, PT, DPT Acute Rehabilitation Services Pager [phone] Office [phone]    Deno Etienne 01/25/2020, 4:26 PM

## 2020-01-25 NOTE — Progress Notes (Signed)
PROGRESS NOTE    Mc Hollen  TKZ:[id number] DOB: 11/10/1950 DOA: 01/23/2020 PCP: Pcp, No    Brief Narrative:  69 y.o. female with medical history significant of hypertension who was in her kitchen got on a stool ladder and missed her steps.  She fell and landed on her left side.  Patient sustained a left femoral fracture.  Seen in the ED and evaluated.  She was subsequently admitted to the hospital after orthopedic consult.  She had no known history of coronary artery disease.  No chest pain.  Patient denied any fever or chills.  No other injuries.  It was purely mechanical fall.  Patient seen by EMS and brought to the hospital where she was found to have the fracture.  No other complaint.  She has had previous left hip fracture with temporary hardware that was removed many years ago.  Dr. Sherrian Divers has seen the patient and plan surgery early tomorrow morning and we are admitting the patient for further work-up..  ED Course: Temperature 98.8 blood pressure 160s over 77 pulse 80 respiratory rate 19 oxygen sat 91% room air.  White count 14.9 hemoglobin 11.4 and platelets 243.  Sodium 133 potassium 3.3 chloride 98 and creatinine 0.71.  COVID-19 screen is negative.  Chest x-ray showed no acute findings.  X-ray of the left hip showed comminuted intertrochanteric left hip fracture with varus angulation.  Patient is being admitted to the hospital for further treatment by orthopedics.  Assessment & Plan:   Principal Problem:   Displaced intertrochanteric fracture of left femur, initial encounter for closed fracture [facility]) Active Problems:   Essential hypertension   #1 left intertrochanteric fracture:  -s/p mechanical fall -Orthopedic Surgery was consulted and pt is now s/p surgery 9/24 -PT/OT consulted, recommendation for CIR -Will cont with analgesia as needed. Seems stable at this time -Seen by Dr. Lorin Mercy today. Recommendation for up with therapy  2 status post fall:  -Appears mechanical in  nature.   -PT/OT consulted per above, recommendation for CIR noted  #3 hypertension:  -BP stable at present -Continue home regimen as tolerated  #4 Anxiety -Much improved with trial of PRN vistaril, will continue  DVT prophylaxis: SCD's Code Status: Full Family Communication: Pt in room, family at bedside  Status is: Inpatient  Remains inpatient appropriate because:Unsafe d/c plan and Inpatient level of care appropriate due to severity of illness   Dispo: The patient is from: Home              Anticipated d/c is to: Unclear at this time. Pending PT/OT eval              Anticipated d/c date is: 3 days              Patient currently is not medically stable to d/c.   Consultants:   Orthopedic Surgery  CIR  Procedures:   Open treatment of subtrochanteric fx with intramedullary implant 9/24  Antimicrobials: Anti-infectives (From admission, onward)   Start     Dose/Rate Route Frequency Ordered Stop   01/23/20 2115  ceFAZolin (ANCEF) IVPB 2g/100 mL premix  Status:  Discontinued        2 g 200 mL/hr over 30 Minutes Intravenous On call to O.R. 01/23/20 2023 01/23/20 2047   01/23/20 2115  ceFAZolin (ANCEF) IVPB 2g/100 mL premix        2 g 200 mL/hr over 30 Minutes Intravenous On call to O.R. 01/23/20 2046 01/23/20 2243      Subjective: Reports anxiety  seems much improved since starting vistaril PRN. In good spirits this AM  Objective: Vitals:   01/24/20 2100 01/25/20 0500 01/25/20 0850 01/25/20 1518  BP: (!) 121/56 125/63 (!) 114/56 (!) 118/58  Pulse: 93 84 75 72  Resp: 18 17 20    Temp: 98.4 F (36.9 C) 99.2 F (37.3 C) 98.4 F (36.9 C) 99 F (37.2 C)  TempSrc: Oral Oral Oral Oral  SpO2: 97% 97% 95% 99%  Weight:      Height:        Intake/Output Summary (Last 24 hours) at 01/25/2020 1532 Last data filed at 01/25/2020 0556 Gross per 24 hour  Intake 600 ml  Output --  Net 600 ml   Filed Weights   01/23/20 1513  Weight: 82.6 kg    Examination: General  exam: Awake, laying in bed, in nad Respiratory system: Normal respiratory effort, no wheezing Cardiovascular system: regular rate, s1, s2 Gastrointestinal system: Soft, nondistended, positive BS Central nervous system: CN2-12 grossly intact, strength intact Extremities: Perfused, no clubbing Skin: Normal skin turgor, no notable skin lesions seen Psychiatry: Mood normal // no visual hallucinations   Data Reviewed: I have personally reviewed following labs and imaging studies  CBC: Recent Labs  Lab 01/23/20 1600 01/23/20 2048 01/24/20 0459  WBC 18.8* 13.4* 14.9*  NEUTROABS 17.2* 11.4*  --   HGB 13.3 12.6 11.4*  HCT 38.7 35.9* 33.4*  MCV 90.6 90.2 92.3  PLT 288 243 371   Basic Metabolic Panel: Recent Labs  Lab 01/23/20 1600 01/23/20 2048 01/24/20 0459  NA 133* 133* 134*  K 3.0* 3.3* 3.2*  CL 96* 98 98  CO2 22 24 25   GLUCOSE 157* 133* 149*  BUN 14 13 9   CREATININE 0.80 0.71 0.75  CALCIUM 9.2 8.4* 8.1*  MG  --   --  1.8   GFR: Estimated Creatinine Clearance: 72.3 mL/min (by C-G formula based on SCr of 0.75 mg/dL). Liver Function Tests: Recent Labs  Lab 01/24/20 0459  AST 41  ALT 38  ALKPHOS 52  BILITOT 0.7  PROT 5.9*  ALBUMIN 3.4*   No results for input(s): LIPASE, AMYLASE in the last 168 hours. No results for input(s): AMMONIA in the last 168 hours. Coagulation Profile: Recent Labs  Lab 01/23/20 1600 01/23/20 2048  INR 1.1 1.0   Cardiac Enzymes: Recent Labs  Lab 01/23/20 2048  CKTOTAL 127   BNP (last 3 results) No results for input(s): PROBNP in the last 8760 hours. HbA1C: No results for input(s): HGBA1C in the last 72 hours. CBG: No results for input(s): GLUCAP in the last 168 hours. Lipid Profile: No results for input(s): CHOL, HDL, LDLCALC, TRIG, CHOLHDL, LDLDIRECT in the last 72 hours. Thyroid Function Tests: No results for input(s): TSH, T4TOTAL, FREET4, T3FREE, THYROIDAB in the last 72 hours. Anemia Panel: No results for input(s):  VITAMINB12, FOLATE, FERRITIN, TIBC, IRON, RETICCTPCT in the last 72 hours. Sepsis Labs: No results for input(s): PROCALCITON, LATICACIDVEN in the last 168 hours.  Recent Results (from the past 240 hour(s))  Respiratory Panel by RT PCR (Flu A&B, Covid) - Nasopharyngeal Swab     Status: None   Collection Time: 01/23/20  3:51 PM   Specimen: Nasopharyngeal Swab  Result Value Ref Range Status   SARS Coronavirus 2 by RT PCR NEGATIVE NEGATIVE Final    Comment: (NOTE) SARS-CoV-2 target nucleic acids are NOT DETECTED.  The SARS-CoV-2 RNA is generally detectable in upper respiratoy specimens during the acute phase of infection. The lowest concentration of SARS-CoV-2  viral copies this assay can detect is 131 copies/mL. A negative result does not preclude SARS-Cov-2 infection and should not be used as the sole basis for treatment or other patient management decisions. A negative result may occur with  improper specimen collection/handling, submission of specimen other than nasopharyngeal swab, presence of viral mutation(s) within the areas targeted by this assay, and inadequate number of viral copies (<131 copies/mL). A negative result must be combined with clinical observations, patient history, and epidemiological information. The expected result is Negative.  Fact Sheet for Patients:  PinkCheek.be  Fact Sheet for Healthcare Providers:  GravelBags.it  This test is no t yet approved or cleared by the [place] FDA and  has been authorized for detection and/or diagnosis of SARS-CoV-2 by FDA under an Emergency Use Authorization (EUA). This EUA will remain  in effect (meaning this test can be used) for the duration of the COVID-19 declaration under Section 564(b)(1) of the Act, 21 U.S.C. section 360bbb-3(b)(1), unless the authorization is terminated or revoked sooner.     Influenza A by PCR NEGATIVE NEGATIVE Final   Influenza B  by PCR NEGATIVE NEGATIVE Final    Comment: (NOTE) The Xpert Xpress SARS-CoV-2/FLU/RSV assay is intended as an aid in  the diagnosis of influenza from Nasopharyngeal swab specimens and  should not be used as a sole basis for treatment. Nasal washings and  aspirates are unacceptable for Xpert Xpress SARS-CoV-2/FLU/RSV  testing.  Fact Sheet for Patients: PinkCheek.be  Fact Sheet for Healthcare Providers: GravelBags.it  This test is not yet approved or cleared by the [place] FDA and  has been authorized for detection and/or diagnosis of SARS-CoV-2 by  FDA under an Emergency Use Authorization (EUA). This EUA will remain  in effect (meaning this test can be used) for the duration of the  Covid-19 declaration under Section 564(b)(1) of the Act, 21  U.S.C. section 360bbb-3(b)(1), unless the authorization is  terminated or revoked. Performed at [facility], [place] [address], [place], [place] [address]      Radiology Studies: DG Chest 1 View  Result Date: 01/23/2020 CLINICAL DATA:  [place], hip fracture, preoperative evaluation EXAM: CHEST  1 VIEW COMPARISON:  08/29/2015 FINDINGS: The heart size and mediastinal contours are within normal limits. Both lungs are clear. The visualized skeletal structures are unremarkable. IMPRESSION: No active disease. Electronically Signed   By: Randa Ngo M.D.   On: 01/23/2020 16:56   DG C-Arm 1-60 Min  Result Date: 01/24/2020 CLINICAL DATA:  Known left hip fracture EXAM: DG HIP (WITH OR WITHOUT PELVIS) 2-3V LEFT; DG C-ARM 1-60 MIN COMPARISON:  Film from earlier in the same day. FLUOROSCOPY TIME:  Fluoroscopy Time:  1 minutes 50 seconds Radiation Exposure Index (if provided by the fluoroscopic device): 8.55 mGy Number of Acquired Spot Images: 8 FINDINGS: Initial images again demonstrate the proximal left femoral fracture. Fracture fragments have been nearly completely reduced. Medullary rod  was then placed with 2 fixation screws proximally and 2 fixation screws distally. Fracture fragments are in near anatomic alignment. IMPRESSION: Status post ORIF of proximal left femoral fracture. Electronically Signed   By: Inez Catalina M.D.   On: 01/24/2020 00:55   DG HIP UNILAT WITH PELVIS 2-3 VIEWS LEFT  Result Date: 01/24/2020 CLINICAL DATA:  Known left hip fracture EXAM: DG HIP (WITH OR WITHOUT PELVIS) 2-3V LEFT; DG C-ARM 1-60 MIN COMPARISON:  Film from earlier in the same day. FLUOROSCOPY TIME:  Fluoroscopy Time:  1 minutes 50 seconds Radiation Exposure  Index (if provided by the fluoroscopic device): 8.55 mGy Number of Acquired Spot Images: 8 FINDINGS: Initial images again demonstrate the proximal left femoral fracture. Fracture fragments have been nearly completely reduced. Medullary rod was then placed with 2 fixation screws proximally and 2 fixation screws distally. Fracture fragments are in near anatomic alignment. IMPRESSION: Status post ORIF of proximal left femoral fracture. Electronically Signed   By: Inez Catalina M.D.   On: 01/24/2020 00:55   DG Hip Unilat With Pelvis 2-3 Views Left  Result Date: 01/23/2020 CLINICAL DATA:  [place] off stool EXAM: DG HIP (WITH OR WITHOUT PELVIS) 2-3V LEFT COMPARISON:  None. FINDINGS: Frontal view of the pelvis as well as frontal and frogleg lateral views of the left hip are obtained. There is a comminuted intertrochanteric left hip fracture with varus angulation at the fracture site. No dislocation. Remainder of the bony pelvis is unremarkable. The right hip is well aligned. IMPRESSION: 1. Comminuted inter trochanteric left hip fracture with varus angulation. Electronically Signed   By: Randa Ngo M.D.   On: 01/23/2020 16:51    Scheduled Meds: . chlorthalidone  50 mg Oral Daily  . loratadine  10 mg Oral Daily  . losartan  100 mg Oral Daily  . metoprolol succinate  50 mg Oral QPM  . polyethylene glycol  17 g Oral Daily   Continuous Infusions: .  sodium chloride 75 mL/hr at 01/24/20 1505     LOS: 2 days   Marylu Lund, MD [facility] Pager On Amion  If 7PM-7AM, please contact night-coverage 01/25/2020, 3:32 PM

## 2020-01-25 NOTE — Progress Notes (Signed)
    Subjective: 2 Days Post-Op Procedure(s) (LRB): INTRAMEDULLARY (IM) NAIL INTERTROCHANTRIC (Left) Patient reports pain as moderate.    Objective: Vital signs in last 24 hours: Temp:  [98.4 F (36.9 C)-99.2 F (37.3 C)] 98.4 F (36.9 C) (09/25 0850) Pulse Rate:  [73-93] 75 (09/25 0850) Resp:  [16-20] 20 (09/25 0850) BP: (114-125)/(56-63) 114/56 (09/25 0850) SpO2:  [93 %-97 %] 95 % (09/25 0850)  Intake/Output from previous day: 09/24 0701 - 09/25 0700 In: 600 [P.O.:600] Out: -  Intake/Output this shift: No intake/output data recorded.  Recent Labs    01/23/20 1600 01/23/20 2048 01/24/20 0459  HGB 13.3 12.6 11.4*   Recent Labs    01/23/20 2048 01/24/20 0459  WBC 13.4* 14.9*  RBC 3.98 3.62*  HCT 35.9* 33.4*  PLT 243 208   Recent Labs    01/23/20 2048 01/24/20 0459  NA 133* 134*  K 3.3* 3.2*  CL 98 98  CO2 24 25  BUN 13 9  CREATININE 0.71 0.75  GLUCOSE 133* 149*  CALCIUM 8.4* 8.1*   Recent Labs    01/23/20 1600 01/23/20 2048  INR 1.1 1.0    Neurologically intact No results found.  Assessment/Plan: 2 Days Post-Op Procedure(s) (LRB): INTRAMEDULLARY (IM) NAIL INTERTROCHANTRIC (Left) Up with therapy.  Some tenderness on anterolateral ankle  No tenderness of fibula distally.   Continue therapy  Kimberly Whitney 01/25/2020, 10:21 AM

## 2020-01-25 NOTE — PMR Pre-admission (Signed)
PMR Admission Coordinator Pre-Admission Assessment  Patient: Kimberly Whitney is an 69 y.o., female MRN: [id number] DOB: 1950-10-25 Height: 5' 5.5" (166.4 cm) Weight: 82.6 kg  Insurance Information HMO:     PPO: yes     PCP:      IPA:      80/20:      OTHER:  PRIMARYLorella [place] of [place]      Policy#: [id number]      Subscriber: patient CM Name:  Janett Billow     Phone#:   [phone]   Fax#: [phone] Pre-Cert#: [id number] approved for 7 days     Employer:  Benefits:  Phone #: [phone]     Name: Opened case with Oneita Kras and Emmit Pomfret. Date: 05/02/2018    Deduct: $5,500       Out of Pocket Max: $6,850       Life Max: NA CIR: 75% coverage; 25% co-insurance      SNF: 75% coverage; 25% co-insurance Outpatient: 75% coverage; 25% co-insurance; $75/day co-pay; limited to 20 visits each of PT/OT/ST     Co-Pay:  Home Health:75% coverage; 25% co-insurance; limited with medical necessity       Co-Pay:  DME: 75% coverage; 25% co-insurance     Co-Pay:  Providers: in-network SECONDARY: Medicare A      Policy#: [id number]     Phone#: active 06/02/2016  Financial Counselor:       Phone#:   The "Data Collection Information Summary" for patients in Inpatient Rehabilitation Facilities with attached "Privacy Act [place] Records" was provided and verbally reviewed with: patient and family  Emergency Contact Information Contact Information    Name Relation Home Work [place] Spouse [phone]  [phone]   Tetter,Brooke Daughter   [phone]      Current Medical History  Patient Admitting Diagnosis: Left femoral fx s/p IM nail  History of Present Illness: 69 year old female with medical history significant for HTN. Presented on 01/23/2020 after falling in her kitchen from a stool ladder and missed her step. Sustained a left femoral fracture.   Orthopedic surgery was consulted and patient s/p IM nailing on 9/24. BP stable and to continue home regimen. Anxiety  improved with trial of prn vistaril. Postoperative constipation to continue cathartics as tolerated and stool softener with senna. Maintained on Lovenox for DVT prophylaxis. Acute blood loss anemia 10/8 and monitored.   Patient's medical record from [facility] has been reviewed by the rehabilitation admission coordinator and physician.  Past Medical History  Past Medical History:  Diagnosis Date  . Hypertension   . Osteopenia     Family History   family history is not on file.  Prior Rehab/Hospitalizations Has the patient had prior rehab or hospitalizations prior to admission? No  Has the patient had major surgery during 100 days prior to admission? Yes   Current Medications  Current Facility-Administered Medications:  .  0.9 %  sodium chloride infusion, , Intravenous, Continuous, Garba, Mohammad L, MD, Last Rate: 75 mL/hr at 01/24/20 1505, Restarted at 01/24/20 1505 .  acetaminophen (TYLENOL) tablet 650 mg, 650 mg, Oral, Q6H PRN, 650 mg at 01/26/20 1059 **OR** acetaminophen (TYLENOL) suppository 650 mg, 650 mg, Rectal, Q6H PRN, Jonelle Sidle, Mohammad L, MD .  bisacodyl (DULCOLAX) suppository 10 mg, 10 mg, Rectal, Daily PRN, Donne Hazel, MD .  chlorthalidone (HYGROTON) tablet 50 mg, 50 mg, Oral, Daily, Gala Romney L, MD, 50 mg at 01/29/20 0934 .  cyclobenzaprine (FLEXERIL) tablet 5 mg, 5 mg,  Oral, TID PRN, Donne Hazel, MD, 5 mg at 01/28/20 2117 .  docusate sodium (COLACE) capsule 100 mg, 100 mg, Oral, BID, Donne Hazel, MD, 100 mg at 01/29/20 0935 .  enoxaparin (LOVENOX) injection 40 mg, 40 mg, Subcutaneous, Q24H, Donne Hazel, MD, 40 mg at 01/29/20 0939 .  HYDROmorphone (DILAUDID) injection 0.5 mg, 0.5 mg, Intravenous, Q3H PRN, Donne Hazel, MD .  hydrOXYzine (ATARAX/VISTARIL) tablet 10 mg, 10 mg, Oral, Q4H PRN, Donne Hazel, MD, 10 mg at 01/29/20 0935 .  loratadine (CLARITIN) tablet 10 mg, 10 mg, Oral, Daily, Gala Romney L, MD, 10 mg at 01/29/20 0934 .   losartan (COZAAR) tablet 100 mg, 100 mg, Oral, Daily, Jonelle Sidle, Mohammad L, MD, 100 mg at 01/29/20 0935 .  metoprolol succinate (TOPROL-XL) 24 hr tablet 50 mg, 50 mg, Oral, QPM, Garba, Mohammad L, MD, 50 mg at 01/28/20 1758 .  ondansetron (ZOFRAN) tablet 4 mg, 4 mg, Oral, Q6H PRN **OR** ondansetron (ZOFRAN) injection 4 mg, 4 mg, Intravenous, Q6H PRN, Jonelle Sidle, Mohammad L, MD .  oxyCODONE-acetaminophen (PERCOCET/ROXICET) 5-325 MG per tablet 1 tablet, 1 tablet, Oral, Q4H PRN, Donne Hazel, MD, 1 tablet at 01/29/20 [phone] .  polyethylene glycol (MIRALAX / GLYCOLAX) packet 17 g, 17 g, Oral, Daily, Donne Hazel, MD, 17 g at 01/29/20 0934 .  senna (SENOKOT) tablet 8.6 mg, 1 tablet, Oral, Daily, Donne Hazel, MD, 8.6 mg at 01/29/20 2694  Patients Current Diet:  Diet Order            Diet Heart Room service appropriate? Yes; Fluid consistency: Thin  Diet effective now                 Precautions / Restrictions Precautions Precautions: Fall, Other (comment) Precaution Comments: Anxious with mobility Restrictions Weight Bearing Restrictions: Yes LLE Weight Bearing: Partial weight bearing LLE Partial Weight Bearing Percentage or Pounds: 25%   Has the patient had 2 or more falls or a fall with injury in the past year? Yes  Prior Activity Level Limited Community (1-2x/wk): drives, watches grandchildren  Prior Functional Level Self Care: Did the patient need help bathing, dressing, using the toilet or eating? Independent  Indoor Mobility: Did the patient need assistance with walking from room to room (with or without device)? Independent  Stairs: Did the patient need assistance with internal or external stairs (with or without device)? Independent  Functional Cognition: Did the patient need help planning regular tasks such as shopping or remembering to take medications? Independent  Home Assistive Devices / Equipment Home Assistive Devices/Equipment: None Home Equipment: Wheelchair -  manual, Shower seat - built in  Prior Device Use: Indicate devices/aids used by the patient prior to current illness, exacerbation or injury? None of the above  Current Functional Level Cognition  Overall Cognitive Status: Within Functional Limits for tasks assessed Orientation Level: Oriented X4 General Comments: very anxious with movement in general, very particular    Extremity Assessment (includes Sensation/Coordination)  Upper Extremity Assessment: Overall WFL for tasks assessed  Lower Extremity Assessment: Defer to PT evaluation RLE Deficits / Details: WFL LLE Deficits / Details: Grossly 2/5, resistive of movement due to pain    ADLs  Overall ADL's : Needs assistance/impaired Eating/Feeding: Independent Grooming: Wash/dry hands, Wash/dry face, Sitting, Min guard Upper Body Bathing: Sitting, Minimal assistance Lower Body Bathing: Maximal assistance, Bed level Upper Body Dressing : Minimal assistance, Sitting Lower Body Dressing: Total assistance, Bed level Lower Body Dressing Details (indicate cue type and reason):  to adjust socks Toilet Transfer: Moderate assistance, +2 for safety/equipment, +2 for physical assistance, BSC, Stand-pivot, RW Toilet Transfer Details (indicate cue type and reason): pt able to stand pivot from EOB>BSC>recliner with MOD- MAX A. MAX A +2 to power up into standing and MOD A +2 to pivot. pt requires step by step cues in relation to body mechanics Toileting- Clothing Manipulation and Hygiene: Total assistance, +2 for physical assistance, Sit to/from stand Toileting - Clothing Manipulation Details (indicate cue type and reason): anterior pericare in standing Functional mobility during ADLs: Moderate assistance, Maximal assistance, +2 for physical assistance, +2 for safety/equipment, Rolling walker General ADL Comments: pt able to progress OOB to recliner with MOD- MAX A +2 to pivot with RW. pt limited by pain and anxiety with movement but overall  progressing from previous session    Mobility  Overal bed mobility: Needs Assistance Bed Mobility: Sit to Supine Supine to sit: Mod assist, +2 for safety/equipment, HOB elevated Sit to supine: Mod assist, +2 for physical assistance General bed mobility comments: to manage BLEs and trunk    Transfers  Overall transfer level: Needs assistance Equipment used: Rolling walker (2 wheeled) Transfers: Sit to/from Stand, Stand Pivot Transfers Sit to Stand: Min assist, +2 physical assistance Stand pivot transfers: Min assist, +2 physical assistance General transfer comment: MinAx2 for all functional transfers today with heavy coaching and cues for sequencing; did much better with RW lowered and had more UE muscle activation    Ambulation / Gait / Stairs / Wheelchair Mobility  Ambulation/Gait General Gait Details: declned    Posture / Balance Dynamic Sitting Balance Sitting balance - Comments: reliant on BUE support Balance Overall balance assessment: Needs assistance Sitting-balance support: Feet supported, Bilateral upper extremity supported Sitting balance-Leahy Scale: Fair Sitting balance - Comments: reliant on BUE support Standing balance support: Bilateral upper extremity supported Standing balance-Leahy Scale: Poor Standing balance comment: reliant on BUE support    Special needs/care consideration  Visitors will be spouse, Kristin Bruins and daughter, Jerene Pitch Anxiety noted since fall [place] arranged   Previous Home Environment  Living Arrangements: Spouse/significant other  Lives With: Spouse Available Help at Discharge: Family Type of Home: House Home Layout: One level Home Access: Stairs to enter Entrance Stairs-Rails: None Technical brewer of Steps: 1 Bathroom Shower/Tub: Multimedia programmer: Handicapped height Bathroom Accessibility: Yes How Accessible: Accessible via walker [place]: No  Discharge Living Setting Plans for Discharge Living  Setting: Patient's home Type of Home at Discharge: House Discharge Home Layout: One level Discharge Home Access: Stairs to enter Entrance Stairs-Rails: None Entrance Stairs-Number of Steps: 1 Discharge Bathroom Shower/Tub: Walk-in shower Discharge Bathroom Toilet: Handicapped height Discharge Bathroom Accessibility: Yes How Accessible: Accessible via walker Does the patient have any problems obtaining your medications?: No  Social/Family/Support Systems Patient Roles: Spouse, Other (Comment) (helps take care of grandchildren) Anticipated Caregiver: Eutha Cude, husband; Moishe Spice, daughter Anticipated Ambulance person Information: [phone]; [phone] Discharge Plan Discussed with Primary Caregiver: Yes Is Caregiver In Agreement with Plan?: Yes Does Caregiver/Family have Issues with Lodging/Transportation while Pt is in Rehab?: No  Goals Pt/Family Agrees to Admission and willing to participate: Yes Program Orientation Provided & Reviewed with Pt/Caregiver Including Roles  & Responsibilities: Yes  Decrease burden of Care through IP rehab admission: NA  Possible need for SNF placement upon discharge: NA  Patient Condition: I have reviewed medical records from [facility], spoken with CM, and patient and spouse. I met with patient at the bedside for inpatient  rehabilitation assessment.  Patient will benefit from ongoing PT and OT, can actively participate in 3 hours of therapy a day 5 days of the week, and can make measurable gains during the admission.  Patient will also benefit from the coordinated team approach during an Inpatient Acute Rehabilitation admission.  The patient will receive intensive therapy as well as Rehabilitation physician, nursing, social worker, and care management interventions.  Due to bladder management, bowel management, safety, skin/wound care, disease management, medication administration, pain management and patient  education the patient requires 24 hour a day rehabilitation nursing.  The patient is currently mod assist with mobility and basic ADLs.  Discharge setting and therapy post discharge at home with home health is anticipated.  Patient has agreed to participate in the Acute Inpatient Rehabilitation Program and will admit today.  Preadmission Screen Completed By:  Cleatrice Burke, 01/29/2020 9:48 AM ______________________________________________________________________   Discussed status with Dr. Posey Pronto  on  01/29/2020 at [phone] and received approval for admission today.  Admission Coordinator:  Cleatrice Burke, RN, time 2902 Date  01/29/2020   Assessment/Plan: Diagnosis: Left femoral fx s/p IM nail  1. Does the need for close, 24 hr/day Medical supervision in concert with the patient's rehab needs make it unreasonable for this patient to be served in a less intensive setting? Yes  2. Co-Morbidities requiring supervision/potential complications: HTN (monitor and provide prns in accordance with increased physical exertion and pain), hyponatremia, ABLA (repeat labs, consider transfusion if necessary to ensure appropriate perfusion for increased activity tolerance) 3. Due to bowel management, safety, skin/wound care, disease management, pain management and patient education, does the patient require 24 hr/day rehab nursing? Yes 4. Does the patient require coordinated care of a physician, rehab nurse, PT, OT to address physical and functional deficits in the context of the above medical diagnosis(es)? Yes Addressing deficits in the following areas: balance, endurance, locomotion, strength, transferring, bathing, dressing, toileting and psychosocial support 5. Can the patient actively participate in an intensive therapy program of at least 3 hrs of therapy 5 days a week? Yes 6. The potential for patient to make measurable gains while on inpatient rehab is excellent 7. Anticipated functional  outcomes upon discharge from inpatient rehab: modified independent and supervision PT, modified independent and supervision OT, n/a SLP 8. Estimated rehab length of stay to reach the above functional goals is: 7-10 days. 9. Anticipated discharge destination: Home 10. Overall Rehab/Functional Prognosis: excellent   MD Signature: Delice Lesch, MD, ABPMR

## 2020-01-26 LAB — BASIC METABOLIC PANEL
Anion gap: 8 (ref 5–15)
BUN: 11 mg/dL (ref 8–23)
CO2: 28 mmol/L (ref 22–32)
Calcium: 8.3 mg/dL — ABNORMAL LOW (ref 8.9–10.3)
Chloride: 97 mmol/L — ABNORMAL LOW (ref 98–111)
Creatinine, Ser: 0.75 mg/dL (ref 0.44–1.00)
GFR calc Af Amer: >60 mL/min (ref >60)
GFR calc non Af Amer: >60 mL/min (ref >60)
Glucose, Bld: 108 mg/dL — ABNORMAL HIGH (ref 70–99)
Potassium: 3.5 mmol/L (ref 3.5–5.1)
Sodium: 133 mmol/L — ABNORMAL LOW (ref 135–145)

## 2020-01-26 MED ORDER — POTASSIUM CHLORIDE CRYS ER 20 MEQ PO TBCR
40.0000 meq | EXTENDED_RELEASE_TABLET | Freq: Once | ORAL | Status: AC
  Administered 2020-01-26: 40 meq via ORAL
  Filled 2020-01-26: qty 2

## 2020-01-26 NOTE — Progress Notes (Signed)
PROGRESS NOTE    Kimberly Whitney  JJH:[id number] DOB: 08-25-1950 DOA: 01/23/2020 PCP: Pcp, No    Brief Narrative:  69 y.o. female with medical history significant of hypertension who was in her kitchen got on a stool ladder and missed her steps.  She fell and landed on her left side.  Patient sustained a left femoral fracture.  Seen in the ED and evaluated.  She was subsequently admitted to the hospital after orthopedic consult.  She had no known history of coronary artery disease.  No chest pain.  Patient denied any fever or chills.  No other injuries.  It was purely mechanical fall.  Patient seen by EMS and brought to the hospital where she was found to have the fracture.  No other complaint.  She has had previous left hip fracture with temporary hardware that was removed many years ago.  Dr. Sherrian Divers has seen the patient and plan surgery early tomorrow morning and we are admitting the patient for further work-up..  ED Course: Temperature 98.8 blood pressure 160s over 77 pulse 80 respiratory rate 19 oxygen sat 91% room air.  White count 14.9 hemoglobin 11.4 and platelets 243.  Sodium 133 potassium 3.3 chloride 98 and creatinine 0.71.  COVID-19 screen is negative.  Chest x-ray showed no acute findings.  X-ray of the left hip showed comminuted intertrochanteric left hip fracture with varus angulation.  Patient is being admitted to the hospital for further treatment by orthopedics.  Assessment & Plan:   Principal Problem:   Displaced intertrochanteric fracture of left femur, initial encounter for closed fracture [facility]) Active Problems:   Essential hypertension   #1 left intertrochanteric fracture:  -s/p mechanical fall -Orthopedic Surgery was consulted and pt is now s/p surgery 9/24 -PT/OT consulted, recommendation for CIR -Will cont with analgesia as needed. Seems stable at this time -Seen by Dr. Lorin Mercy today. Recommendation for up with therapy  2 status post fall:  -Appears mechanical in  nature.   -PT/OT consulted per above, recommendation for CIR noted  #3 hypertension:  -BP stable at present -Continue home regimen as tolerated  #4 Anxiety -Much improved with trial of PRN vistaril, will continue  #5 Constipation -Pt seen struggling to have BM this AM despite miralax -PRN dulcolax already ordered, advised pt to take  -Continue with cathartics as needed  DVT prophylaxis: SCD's Code Status: Full Family Communication: Pt in room, family at bedside  Status is: Inpatient  Remains inpatient appropriate because:Unsafe d/c plan and Inpatient level of care appropriate due to severity of illness   Dispo: The patient is from: Home              Anticipated d/c is to: Unclear at this time. Pending PT/OT eval              Anticipated d/c date is: 3 days              Patient currently is not medically stable to d/c.   Consultants:   Orthopedic Surgery  CIR  Procedures:   Open treatment of subtrochanteric fx with intramedullary implant 9/24  Antimicrobials: Anti-infectives (From admission, onward)   Start     Dose/Rate Route Frequency Ordered Stop   01/23/20 2115  ceFAZolin (ANCEF) IVPB 2g/100 mL premix  Status:  Discontinued        2 g 200 mL/hr over 30 Minutes Intravenous On call to O.R. 01/23/20 2023 01/23/20 2047   01/23/20 2115  ceFAZolin (ANCEF) IVPB 2g/100 mL premix  2 g 200 mL/hr over 30 Minutes Intravenous On call to O.R. 01/23/20 2046 01/23/20 2243      Subjective: Still noting good results with vistaril. This AM, complaining of constipation  Objective: Vitals:   01/25/20 2008 01/26/20 0505 01/26/20 0844 01/26/20 1500  BP: (!) 114/51 (!) 124/49 (!) 102/49 (!) 118/53  Pulse: 67 70 76 74  Resp: 16 18 17 17   Temp: 98.8 F (37.1 C) 99.5 F (37.5 C) 99.3 F (37.4 C) 99.1 F (37.3 C)  TempSrc: Oral Oral Oral Oral  SpO2: 95% 90% 94% 95%  Weight:      Height:        Intake/Output Summary (Last 24 hours) at 01/26/2020 1533 Last data  filed at 01/26/2020 0900 Gross per 24 hour  Intake 240 ml  Output --  Net 240 ml   Filed Weights   01/23/20 1513  Weight: 82.6 kg    Examination: General exam: Conversant, in no acute distress Respiratory system: normal chest rise, clear, no audible wheezing Cardiovascular system: regular rhythm, s1-s2 Gastrointestinal system: Nondistended, nontender, pos BS Central nervous system: No seizures, no tremors Extremities: No cyanosis, no joint deformities Skin: No rashes, no pallor Psychiatry: Affect normal // no auditory hallucinations     Data Reviewed: I have personally reviewed following labs and imaging studies  CBC: Recent Labs  Lab 01/23/20 1600 01/23/20 2048 01/24/20 0459  WBC 18.8* 13.4* 14.9*  NEUTROABS 17.2* 11.4*  --   HGB 13.3 12.6 11.4*  HCT 38.7 35.9* 33.4*  MCV 90.6 90.2 92.3  PLT 288 243 532   Basic Metabolic Panel: Recent Labs  Lab 01/23/20 1600 01/23/20 2048 01/24/20 0459 01/26/20 0232  NA 133* 133* 134* 133*  K 3.0* 3.3* 3.2* 3.5  CL 96* 98 98 97*  CO2 22 24 25 28   GLUCOSE 157* 133* 149* 108*  BUN 14 13 9 11   CREATININE 0.80 0.71 0.75 0.75  CALCIUM 9.2 8.4* 8.1* 8.3*  MG  --   --  1.8  --    GFR: Estimated Creatinine Clearance: 72.3 mL/min (by C-G formula based on SCr of 0.75 mg/dL). Liver Function Tests: Recent Labs  Lab 01/24/20 0459  AST 41  ALT 38  ALKPHOS 52  BILITOT 0.7  PROT 5.9*  ALBUMIN 3.4*   No results for input(s): LIPASE, AMYLASE in the last 168 hours. No results for input(s): AMMONIA in the last 168 hours. Coagulation Profile: Recent Labs  Lab 01/23/20 1600 01/23/20 2048  INR 1.1 1.0   Cardiac Enzymes: Recent Labs  Lab 01/23/20 2048  CKTOTAL 127   BNP (last 3 results) No results for input(s): PROBNP in the last 8760 hours. HbA1C: No results for input(s): HGBA1C in the last 72 hours. CBG: No results for input(s): GLUCAP in the last 168 hours. Lipid Profile: No results for input(s): CHOL, HDL, LDLCALC,  TRIG, CHOLHDL, LDLDIRECT in the last 72 hours. Thyroid Function Tests: No results for input(s): TSH, T4TOTAL, FREET4, T3FREE, THYROIDAB in the last 72 hours. Anemia Panel: No results for input(s): VITAMINB12, FOLATE, FERRITIN, TIBC, IRON, RETICCTPCT in the last 72 hours. Sepsis Labs: No results for input(s): PROCALCITON, LATICACIDVEN in the last 168 hours.  Recent Results (from the past 240 hour(s))  Respiratory Panel by RT PCR (Flu A&B, Covid) - Nasopharyngeal Swab     Status: None   Collection Time: 01/23/20  3:51 PM   Specimen: Nasopharyngeal Swab  Result Value Ref Range Status   SARS Coronavirus 2 by RT PCR NEGATIVE NEGATIVE Final  Comment: (NOTE) SARS-CoV-2 target nucleic acids are NOT DETECTED.  The SARS-CoV-2 RNA is generally detectable in upper respiratoy specimens during the acute phase of infection. The lowest concentration of SARS-CoV-2 viral copies this assay can detect is 131 copies/mL. A negative result does not preclude SARS-Cov-2 infection and should not be used as the sole basis for treatment or other patient management decisions. A negative result may occur with  improper specimen collection/handling, submission of specimen other than nasopharyngeal swab, presence of viral mutation(s) within the areas targeted by this assay, and inadequate number of viral copies (<131 copies/mL). A negative result must be combined with clinical observations, patient history, and epidemiological information. The expected result is Negative.  Fact Sheet for Patients:  PinkCheek.be  Fact Sheet for Healthcare Providers:  GravelBags.it  This test is no t yet approved or cleared by the [place] FDA and  has been authorized for detection and/or diagnosis of SARS-CoV-2 by FDA under an Emergency Use Authorization (EUA). This EUA will remain  in effect (meaning this test can be used) for the duration of the COVID-19  declaration under Section 564(b)(1) of the Act, 21 U.S.C. section 360bbb-3(b)(1), unless the authorization is terminated or revoked sooner.     Influenza A by PCR NEGATIVE NEGATIVE Final   Influenza B by PCR NEGATIVE NEGATIVE Final    Comment: (NOTE) The Xpert Xpress SARS-CoV-2/FLU/RSV assay is intended as an aid in  the diagnosis of influenza from Nasopharyngeal swab specimens and  should not be used as a sole basis for treatment. Nasal washings and  aspirates are unacceptable for Xpert Xpress SARS-CoV-2/FLU/RSV  testing.  Fact Sheet for Patients: PinkCheek.be  Fact Sheet for Healthcare Providers: GravelBags.it  This test is not yet approved or cleared by the [place] FDA and  has been authorized for detection and/or diagnosis of SARS-CoV-2 by  FDA under an Emergency Use Authorization (EUA). This EUA will remain  in effect (meaning this test can be used) for the duration of the  Covid-19 declaration under Section 564(b)(1) of the Act, 21  U.S.C. section 360bbb-3(b)(1), unless the authorization is  terminated or revoked. Performed at [facility], [place] [address] [place], [place] [address]      Radiology Studies: No results found.  Scheduled Meds: . chlorthalidone  50 mg Oral Daily  . loratadine  10 mg Oral Daily  . losartan  100 mg Oral Daily  . metoprolol succinate  50 mg Oral QPM  . polyethylene glycol  17 g Oral Daily   Continuous Infusions: . sodium chloride 75 mL/hr at 01/24/20 1505     LOS: 3 days   Marylu Lund, MD [facility] Pager On Amion  If 7PM-7AM, please contact night-coverage 01/26/2020, 3:33 PM

## 2020-01-26 NOTE — Progress Notes (Signed)
   Subjective: 3 Days Post-Op Procedure(s) (LRB): INTRAMEDULLARY (IM) NAIL INTERTROCHANTRIC (Left) Patient reports pain as moderate.  Better spirits.   Objective: Vital signs in last 24 hours: Temp:  [98.8 F (37.1 C)-99.5 F (37.5 C)] 99.3 F (37.4 C) (09/26 0844) Pulse Rate:  [67-76] 76 (09/26 0844) Resp:  [16-18] 17 (09/26 0844) BP: (102-124)/(49-58) 102/49 (09/26 0844) SpO2:  [90 %-99 %] 94 % (09/26 0844)  Intake/Output from previous day: No intake/output data recorded. Intake/Output this shift: No intake/output data recorded.  Recent Labs    01/23/20 1600 01/23/20 2048 01/24/20 0459  HGB 13.3 12.6 11.4*   Recent Labs    01/23/20 2048 01/24/20 0459  WBC 13.4* 14.9*  RBC 3.98 3.62*  HCT 35.9* 33.4*  PLT 243 208   Recent Labs    01/24/20 0459 01/26/20 0232  NA 134* 133*  K 3.2* 3.5  CL 98 97*  CO2 25 28  BUN 9 11  CREATININE 0.75 0.75  GLUCOSE 149* 108*  CALCIUM 8.1* 8.3*   Recent Labs    01/23/20 1600 01/23/20 2048  INR 1.1 1.0    Neurologically intact No results found.  Assessment/Plan: 3 Days Post-Op Procedure(s) (LRB): INTRAMEDULLARY (IM) NAIL INTERTROCHANTRIC (Left) Up with therapy  Marybelle Killings 01/26/2020, 9:02 AM

## 2020-01-26 NOTE — Plan of Care (Signed)
  Problem: Pain Managment: Goal: General experience of comfort will improve Outcome: Progressing   Problem: Safety: Goal: Ability to remain free from injury will improve Outcome: Progressing   Problem: Skin Integrity: Goal: Risk for impaired skin integrity will decrease Outcome: Progressing   

## 2020-01-26 NOTE — Progress Notes (Signed)
Physical Therapy Treatment Patient Details Name: Kimberly Whitney MRN: [id number] DOB: Dec 16, 1950 Today's Date: 01/26/2020    History of Present Illness Pt is a 69 y.o. F with significant PMH of hypertension, osteopenia, and L hip fracture 30 years ago who presents after a fall wtih a left femoral fracture now s/p open treatment with intramedullary implant.      PT Comments    Pt making good progress towards her physical therapy goals today; less limited by muscle spasm and pain today compared to yesterday. Initiated session with warm up exercises for LLE ROM. Pt requiring moderate assist for bed mobility and stand pivot transfers using a walker. Pt remains highly motivated and suspect continued steady progress. Continue to recommend comprehensive inpatient rehab (CIR) for post-acute therapy needs.    Follow Up Recommendations  CIR     Equipment Recommendations  Rolling walker with 5" wheels;3in1 (PT)    Recommendations for Other Services       Precautions / Restrictions Precautions Precautions: Fall;Other (comment) Precaution Comments: Anxious with mobility Restrictions Weight Bearing Restrictions: Yes LLE Weight Bearing: Partial weight bearing LLE Partial Weight Bearing Percentage or Pounds: 25%    Mobility  Bed Mobility Overal bed mobility: Needs Assistance Bed Mobility: Supine to Sit     Supine to sit: Mod assist     General bed mobility comments: ModA for LLE management off edge of bed, trunk to upright. cues for use of bed rail  Transfers Overall transfer level: Needs assistance Equipment used: Rolling walker (2 wheeled) Transfers: Sit to/from [facility] Sit to Stand: Mod assist Stand pivot transfers: Mod assist       General transfer comment: ModA to rise, cues for hand/foot placement. Pivoted towards left with max cues for sequencing, walker proximity, direction, weightbearing precautions. Pt unable to clear either foot, instead "shimmying,"  with manual assist for walker  Ambulation/Gait                 Stairs             Wheelchair Mobility    Modified Rankin (Stroke Patients Only)       Balance Overall balance assessment: Needs assistance Sitting-balance support: Feet supported;Bilateral upper extremity supported Sitting balance-Leahy Scale: Poor Sitting balance - Comments: reliant on BUE support   Standing balance support: Bilateral upper extremity supported Standing balance-Leahy Scale: Poor                              Cognition Arousal/Alertness: Awake/alert Behavior During Therapy: Anxious Overall Cognitive Status: Within Functional Limits for tasks assessed                                        Exercises General Exercises - Lower Extremity Ankle Circles/Pumps: Both;Supine;20 reps Quad Sets: Both;Supine;15 reps Long Arc Quad: Both;10 reps;Seated Heel Slides: AAROM;Supine;10 reps;AROM;Both Hip ABduction/ADduction: AAROM;Supine;AROM;10 reps;Left    General Comments        Pertinent Vitals/Pain Faces Pain Scale: Hurts worst Pain Location: L hip Pain Descriptors / Indicators: Sharp;Operative site guarding;Grimacing;Throbbing;Spasm    Home Living                      Prior Function            PT Goals (current goals can now be found in the care plan section) Acute Rehab  PT Goals Patient Stated Goal: less pain, return home PT Goal Formulation: With patient/family Time For Goal Achievement: 02/07/20 Potential to Achieve Goals: Good Progress towards PT goals: Progressing toward goals    Frequency    Min 5X/week      PT Plan Current plan remains appropriate    Co-evaluation              AM-PAC PT "6 Clicks" Mobility   Outcome Measure  Help needed turning from your back to your side while in a flat bed without using bedrails?: A Lot Help needed moving from lying on your back to sitting on the side of a flat bed without  using bedrails?: A Lot Help needed moving to and from a bed to a chair (including a wheelchair)?: A Lot Help needed standing up from a chair using your arms (e.g., wheelchair or bedside chair)?: A Lot Help needed to walk in hospital room?: Total Help needed climbing 3-5 steps with a railing? : Total 6 Click Score: 10    End of Session Equipment Utilized During Treatment: Gait belt Activity Tolerance: Patient tolerated treatment well Patient left: with call bell/phone within reach;in chair;with chair alarm set;with family/visitor present Nurse Communication: Mobility status PT Visit Diagnosis: Pain;Other abnormalities of gait and mobility (R26.89);Difficulty in walking, not elsewhere classified (R26.2) Pain - Right/Left: Left Pain - part of body: Hip     Time: 6269-4854 PT Time Calculation (min) (ACUTE ONLY): 27 min  Charges:  $Therapeutic Exercise: 8-22 mins $Therapeutic Activity: 8-22 mins                       Kimberly Whitney, PT, DPT Acute Rehabilitation Services Pager [phone] Office [phone]    Kimberly Whitney 01/26/2020, 4:15 PM

## 2020-01-26 NOTE — Plan of Care (Signed)

## 2020-01-27 LAB — BPAM RBC
Blood Product Expiration Date: 202110042359
Blood Product Expiration Date: 202110112359
Unit Type and Rh: 9500
Unit Type and Rh: 9500

## 2020-01-27 LAB — TYPE AND SCREEN
ABO/RH(D): O NEG
Antibody Screen: POSITIVE
Donor AG Type: NEGATIVE
Donor AG Type: NEGATIVE
PT AG Type: NEGATIVE
Unit division: 0
Unit division: 0

## 2020-01-27 LAB — CREATININE, SERUM
Creatinine, Ser: 0.74 mg/dL (ref 0.44–1.00)
GFR calc Af Amer: >60 mL/min (ref >60)
GFR calc non Af Amer: >60 mL/min (ref >60)

## 2020-01-27 LAB — CBC
HCT: 32.3 % — ABNORMAL LOW (ref 36.0–46.0)
Hemoglobin: 10.8 g/dL — ABNORMAL LOW (ref 12.0–15.0)
MCH: 31.7 pg (ref 26.0–34.0)
MCHC: 33.4 g/dL (ref 30.0–36.0)
MCV: 94.7 fL (ref 80.0–100.0)
Platelets: 262 10*3/uL (ref 150–400)
RBC: 3.41 MIL/uL — ABNORMAL LOW (ref 3.87–5.11)
RDW: 12.7 % (ref 11.5–15.5)
WBC: 10.1 10*3/uL (ref 4.0–10.5)
nRBC: 0 % (ref 0.0–0.2)

## 2020-01-27 MED ORDER — SENNA 8.6 MG PO TABS
1.0000 | ORAL_TABLET | Freq: Every day | ORAL | Status: DC
  Administered 2020-01-27 – 2020-01-29 (×3): 8.6 mg via ORAL
  Filled 2020-01-27 (×3): qty 1

## 2020-01-27 MED ORDER — HYDROMORPHONE HCL 1 MG/ML IJ SOLN: 0.5000 mg | INTRAMUSCULAR | Status: DC | PRN

## 2020-01-27 MED ORDER — DOCUSATE SODIUM 100 MG PO CAPS
100.0000 mg | ORAL_CAPSULE | Freq: Two times a day (BID) | ORAL | Status: DC
  Administered 2020-01-27 – 2020-01-29 (×4): 100 mg via ORAL
  Filled 2020-01-27 (×5): qty 1

## 2020-01-27 MED ORDER — ENOXAPARIN SODIUM 40 MG/0.4ML ~~LOC~~ SOLN
40.0000 mg | SUBCUTANEOUS | Status: DC
  Administered 2020-01-27 – 2020-01-29 (×3): 40 mg via SUBCUTANEOUS
  Filled 2020-01-27 (×3): qty 0.4

## 2020-01-27 MED ORDER — POTASSIUM CHLORIDE CRYS ER 20 MEQ PO TBCR
60.0000 meq | EXTENDED_RELEASE_TABLET | Freq: Once | ORAL | Status: AC
  Administered 2020-01-27: 60 meq via ORAL
  Filled 2020-01-27 (×2): qty 3

## 2020-01-27 MED ORDER — OXYCODONE-ACETAMINOPHEN 5-325 MG PO TABS
1.0000 | ORAL_TABLET | ORAL | Status: DC | PRN
  Administered 2020-01-27 – 2020-01-29 (×6): 1 via ORAL
  Filled 2020-01-27 (×6): qty 1

## 2020-01-27 NOTE — Progress Notes (Signed)
Occupational Therapy Treatment Patient Details Name: Kimberly Whitney MRN: [id number] DOB: Dec 14, 1950 Today's Date: 01/27/2020    History of present illness Pt is a 69 y.o. F with significant PMH of hypertension, osteopenia, and L hip fracture 30 years ago who presents after a fall wtih a left femoral fracture now s/p open treatment with intramedullary implant.     OT comments  Pt making steady progress towards OT goals this session. Session focus on functional mobility as precursor to higher level BADLs. Pt continues to present with pain, decreased activity tolerance and anxiety with all mobility tasks. Overall, pt requires MOD- MAX A +2 for sit<>stands and MOD A +2 to pivot with RW. Pt was able to pivot from EOB>BSC>recliner this session increasing pts activity tolerance. Pt required MOD A +2 for bed mobility this session. Pt would continue to benefit from skilled occupational therapy while admitted and after d/c to address the below listed limitations in order to improve overall functional mobility and facilitate independence with BADL participation. DC plan remains appropriate, will follow acutely per POC.     Follow Up Recommendations  SNF;Supervision/Assistance - 24 hour;CIR    Equipment Recommendations  3 in 1 bedside commode    Recommendations for Other Services      Precautions / Restrictions Precautions Precautions: Fall;Other (comment) Precaution Comments: Anxious with mobility Restrictions Weight Bearing Restrictions: Yes LLE Weight Bearing: Partial weight bearing LLE Partial Weight Bearing Percentage or Pounds: 25%       Mobility Bed Mobility Overal bed mobility: Needs Assistance Bed Mobility: Supine to Sit     Supine to sit: Mod assist;+2 for safety/equipment;HOB elevated     General bed mobility comments: MOD A for LLE mgmt and use of bed pad to pivot hips to EOB; pt requires step by step cues for all aspects of bed mobility  Transfers Overall transfer level:  Needs assistance Equipment used: Rolling walker (2 wheeled) Transfers: Sit to/from [facility] Sit to Stand: Max assist;+2 safety/equipment;Mod assist Stand pivot transfers: Mod assist;+2 physical assistance       General transfer comment: pt complete x2 sit<>stand from EOB and BSC, MAX A +2 from EOB and MOD A +2 from [facility]. pt complete x2 stand pivot transfers with MOD A +2 and RW. pt with heavy reliance on BUE strength and requires step by step cues to complete mobility tasks    Balance Overall balance assessment: Needs assistance Sitting-balance support: Feet supported;Bilateral upper extremity supported Sitting balance-Leahy Scale: Poor Sitting balance - Comments: reliant on BUE support   Standing balance support: Bilateral upper extremity supported Standing balance-Leahy Scale: Poor Standing balance comment: reliant on BUE support                           ADL either performed or assessed with clinical judgement   ADL Overall ADL's : Needs assistance/impaired                     Lower Body Dressing: Total assistance;Bed level Lower Body Dressing Details (indicate cue type and reason): to adjust socks Toilet Transfer: Moderate assistance;+2 for safety/equipment;+2 for physical assistance;BSC;Stand-pivot;RW Toilet Transfer Details (indicate cue type and reason): pt able to stand pivot from EOB>BSC>recliner with MOD- MAX A. MAX A +2 to power up into standing and MOD A +2 to pivot. pt requires step by step cues in relation to body mechanics Toileting- Clothing Manipulation and Hygiene: Total assistance;+2 for physical assistance;Sit to/from stand Toileting -  Clothing Manipulation Details (indicate cue type and reason): anterior pericare in standing     Functional mobility during ADLs: Moderate assistance;Maximal assistance;+2 for physical assistance;+2 for safety/equipment;Rolling walker General ADL Comments: pt able to progress OOB to recliner  with MOD- MAX A +2 to pivot with RW. pt limited by pain and anxiety with movement but overall progressing from previous session     Vision       Perception     Praxis      Cognition Arousal/Alertness: Awake/alert Behavior During Therapy: Anxious;WFL for tasks assessed/performed Overall Cognitive Status: Within Functional Limits for tasks assessed                                 General Comments: anxious with movement; benefits from step by step instructions        Exercises     Shoulder Instructions       General Comments      Pertinent Vitals/ Pain       Pain Assessment: 0-10 Pain Score: 3  Pain Location: L hip Pain Descriptors / Indicators: Sharp;Operative site guarding;Grimacing;Throbbing;Spasm Pain Intervention(s): Limited activity within patient's tolerance;Monitored during session;Repositioned;Premedicated before session  Home Living                                          Prior Functioning/Environment              Frequency  Min 2X/week        Progress Toward Goals  OT Goals(current goals can now be found in the care plan section)  Progress towards OT goals: Progressing toward goals  Acute Rehab OT Goals Patient Stated Goal: less pain, return home OT Goal Formulation: With patient Time For Goal Achievement: 02/08/20 Potential to Achieve Goals: Good  Plan Discharge plan remains appropriate;Frequency remains appropriate    Co-evaluation                 AM-PAC OT "6 Clicks" Daily Activity     Outcome Measure   Help from another person eating meals?: None Help from another person taking care of personal grooming?: A Little Help from another person toileting, which includes using toliet, bedpan, or urinal?: A Lot Help from another person bathing (including washing, rinsing, drying)?: A Lot Help from another person to put on and taking off regular upper body clothing?: None Help from another person to  put on and taking off regular lower body clothing?: A Lot 6 Click Score: 17    End of Session Equipment Utilized During Treatment: Gait belt;Rolling walker  OT Visit Diagnosis: Unsteadiness on feet (R26.81);History of falling (Z91.81)   Activity Tolerance Patient tolerated treatment well   Patient Left in chair;with call bell/phone within reach   Nurse Communication Mobility status;Other (comment) (OOB for toileting)        Time: 2751-7001 OT Time Calculation (min): 34 min  Charges: OT General Charges $OT Visit: 1 Visit OT Treatments $Self Care/Home Management : 8-22 mins $Therapeutic Activity: 8-22 mins  Lanier Clam., COTA/L Acute Rehabilitation Services [phone]    Ihor Gully 01/27/2020, 11:25 AM

## 2020-01-27 NOTE — Progress Notes (Signed)
Physical Therapy Treatment Patient Details Name: Kimberly Whitney MRN: [id number] DOB: 1950-11-14 Today's Date: 01/27/2020    History of Present Illness Pt is a 69 y.o. F with significant PMH of hypertension, osteopenia, and L hip fracture 30 years ago who presents after a fall wtih a left femoral fracture now s/p open treatment with intramedullary implant.      PT Comments    Patient received in chair, reports she had a workout with OT and not up to practicing more transfers today but agreeable to exercises in the chair. Performed ankle pumps, heel slides, quad sets, SAQs, and glute sets at chair level with min-mod VC/TC for correct form and technique. All exercises sent via [place] as well. Left up in recliner with all needs met, family and RN present. Will continue to follow acutely.     Follow Up Recommendations  CIR     Equipment Recommendations  Rolling walker with 5" wheels;3in1 (PT)    Recommendations for Other Services       Precautions / Restrictions Precautions Precautions: Fall;Other (comment) Precaution Comments: Anxious with mobility Restrictions Weight Bearing Restrictions: Yes LLE Weight Bearing: Partial weight bearing LLE Partial Weight Bearing Percentage or Pounds: 25%    Mobility  Bed Mobility Overal bed mobility: Needs Assistance Bed Mobility: Supine to Sit     Supine to sit: Mod assist;+2 for safety/equipment;HOB elevated     General bed mobility comments: OOB in chair  Transfers Overall transfer level: Needs assistance Equipment used: Rolling walker (2 wheeled) Transfers: Sit to/from [facility] Sit to Stand: Max assist;+2 safety/equipment;Mod assist Stand pivot transfers: Mod assist;+2 physical assistance       General transfer comment: OOB in chair, declined further transfers  Ambulation/Gait             General Gait Details: declned   Stairs             Wheelchair Mobility    Modified Rankin (Stroke  Patients Only)       Balance Overall balance assessment: Needs assistance Sitting-balance support: Feet supported;Bilateral upper extremity supported Sitting balance-Leahy Scale: Poor Sitting balance - Comments: reliant on BUE support   Standing balance support: Bilateral upper extremity supported Standing balance-Leahy Scale: Poor Standing balance comment: reliant on BUE support                            Cognition Arousal/Alertness: Awake/alert Behavior During Therapy: WFL for tasks assessed/performed Overall Cognitive Status: Within Functional Limits for tasks assessed                                 General Comments: anxious with movement; benefits from step by step instructions      Exercises      General Comments        Pertinent Vitals/Pain Pain Assessment: Faces Pain Score: 3  Faces Pain Scale: Hurts little more Pain Location: L hip Pain Descriptors / Indicators: Sharp;Operative site guarding;Grimacing;Throbbing;Spasm Pain Intervention(s): Limited activity within patient's tolerance;Monitored during session;Ice applied;Premedicated before session    Home Living                      Prior Function            PT Goals (current goals can now be found in the care plan section) Acute Rehab PT Goals Patient Stated Goal: less pain, return home PT  Goal Formulation: With patient/family Time For Goal Achievement: 02/07/20 Potential to Achieve Goals: Good Progress towards PT goals: Progressing toward goals    Frequency    Min 5X/week      PT Plan Current plan remains appropriate    Co-evaluation              AM-PAC PT "6 Clicks" Mobility   Outcome Measure  Help needed turning from your back to your side while in a flat bed without using bedrails?: A Lot Help needed moving from lying on your back to sitting on the side of a flat bed without using bedrails?: A Lot Help needed moving to and from a bed to a chair  (including a wheelchair)?: A Lot Help needed standing up from a chair using your arms (e.g., wheelchair or bedside chair)?: A Lot Help needed to walk in hospital room?: Total Help needed climbing 3-5 steps with a railing? : Total 6 Click Score: 10    End of Session   Activity Tolerance: Patient tolerated treatment well Patient left: in chair;with call bell/phone within reach;with family/visitor present Nurse Communication: Mobility status PT Visit Diagnosis: Pain;Other abnormalities of gait and mobility (R26.89);Difficulty in walking, not elsewhere classified (R26.2) Pain - Right/Left: Left Pain - part of body: Hip     Time: 1200-1223 PT Time Calculation (min) (ACUTE ONLY): 23 min  Charges:  $Therapeutic Exercise: 23-37 mins                     Kristen U PT, DPT, PN1   Supplemental Physical Therapist [place]    Pager [phone] Acute Rehab Office [phone]    

## 2020-01-27 NOTE — Progress Notes (Signed)
PROGRESS NOTE    Kimberly Whitney  PYK:[id number] DOB: Jan 27, 1951 DOA: 01/23/2020 PCP: Pcp, No    Brief Narrative:  69 y.o. female with medical history significant of hypertension who was in her kitchen got on a stool ladder and missed her steps.  She fell and landed on her left side.  Patient sustained a left femoral fracture.  Seen in the ED and evaluated.  She was subsequently admitted to the hospital after orthopedic consult.  She had no known history of coronary artery disease.  No chest pain.  Patient denied any fever or chills.  No other injuries.  It was purely mechanical fall.  Patient seen by EMS and brought to the hospital where she was found to have the fracture.  No other complaint.  She has had previous left hip fracture with temporary hardware that was removed many years ago.  Dr. Sherrian Divers has seen the patient and plan surgery early tomorrow morning and we are admitting the patient for further work-up..  ED Course: Temperature 98.8 blood pressure 160s over 77 pulse 80 respiratory rate 19 oxygen sat 91% room air.  White count 14.9 hemoglobin 11.4 and platelets 243.  Sodium 133 potassium 3.3 chloride 98 and creatinine 0.71.  COVID-19 screen is negative.  Chest x-ray showed no acute findings.  X-ray of the left hip showed comminuted intertrochanteric left hip fracture with varus angulation.  Patient is being admitted to the hospital for further treatment by orthopedics.  Assessment & Plan:   Principal Problem:   Displaced intertrochanteric fracture of left femur, initial encounter for closed fracture [facility]) Active Problems:   Essential hypertension   #1 left intertrochanteric fracture:  -s/p mechanical fall -Orthopedic Surgery was consulted and pt is now s/p surgery 9/24 -PT/OT consulted, recommendation for CIR -Will cont with analgesia as needed. Seems stable currently -PT/OT rec for CIR/SNF. Dispo currently being determined. Will f/u  2 status post fall:  -Appears mechanical in  nature.   -PT/OT consulted per above, recommendation for CIR/SNF noted  #3 hypertension:  -BP stable at present -Continue home regimen as pt tolerates  #4 Anxiety -Much improved with trial of PRN vistaril, will continue  #5 Constipation -Pt seen struggling to have BM this visit -Will continue with cathartics as tolerated -Will continue on scheduled stool softener with senna  DVT prophylaxis: SCD's Code Status: Full Family Communication: Pt in room, family at bedside  Status is: Inpatient  Remains inpatient appropriate because:Unsafe d/c plan and Inpatient level of care appropriate due to severity of illness   Dispo: The patient is from: Home              Anticipated d/c is to: Unclear at this time. Pending PT/OT eval              Anticipated d/c date is: 3 days              Patient currently is not medically stable to d/c.   Consultants:   Orthopedic Surgery  CIR  Procedures:   Open treatment of subtrochanteric fx with intramedullary implant 9/24  Antimicrobials: Anti-infectives (From admission, onward)   Start     Dose/Rate Route Frequency Ordered Stop   01/23/20 2115  ceFAZolin (ANCEF) IVPB 2g/100 mL premix  Status:  Discontinued        2 g 200 mL/hr over 30 Minutes Intravenous On call to O.R. 01/23/20 2023 01/23/20 2047   01/23/20 2115  ceFAZolin (ANCEF) IVPB 2g/100 mL premix  2 g 200 mL/hr over 30 Minutes Intravenous On call to O.R. 01/23/20 2046 01/23/20 2243      Subjective: No complaints this AM, seems to be in better spirits  Objective: Vitals:   01/26/20 1747 01/27/20 0455 01/27/20 0758 01/27/20 1530  BP:  (!) 118/52 116/61 (!) 103/42  Pulse: 68 71 70 76  Resp:  16 17 17   Temp:  98.8 F (37.1 C) 98.5 F (36.9 C) 99 F (37.2 C)  TempSrc:  Oral Oral Oral  SpO2:  96% 95% 96%  Weight:      Height:        Intake/Output Summary (Last 24 hours) at 01/27/2020 1532 Last data filed at 01/27/2020 0900 Gross per 24 hour  Intake 480 ml    Output 400 ml  Net 80 ml   Filed Weights   01/23/20 1513  Weight: 82.6 kg    Examination: General exam: Awake, laying in bed, in nad Respiratory system: Normal respiratory effort, no wheezing Cardiovascular system: regular rate, s1, s2 Gastrointestinal system: Soft, nondistended, positive BS Central nervous system: CN2-12 grossly intact, strength intact Extremities: Perfused, no clubbing Skin: Normal skin turgor, no notable skin lesions seen Psychiatry: Mood normal // no visual hallucinations   Data Reviewed: I have personally reviewed following labs and imaging studies  CBC: Recent Labs  Lab 01/23/20 1600 01/23/20 2048 01/24/20 0459 01/27/20 1422  WBC 18.8* 13.4* 14.9* 10.1  NEUTROABS 17.2* 11.4*  --   --   HGB 13.3 12.6 11.4* 10.8*  HCT 38.7 35.9* 33.4* 32.3*  MCV 90.6 90.2 92.3 94.7  PLT 288 243 208 350   Basic Metabolic Panel: Recent Labs  Lab 01/23/20 1600 01/23/20 2048 01/24/20 0459 01/26/20 0232  NA 133* 133* 134* 133*  K 3.0* 3.3* 3.2* 3.5  CL 96* 98 98 97*  CO2 22 24 25 28   GLUCOSE 157* 133* 149* 108*  BUN 14 13 9 11   CREATININE 0.80 0.71 0.75 0.75  CALCIUM 9.2 8.4* 8.1* 8.3*  MG  --   --  1.8  --    GFR: Estimated Creatinine Clearance: 72.3 mL/min (by C-G formula based on SCr of 0.75 mg/dL). Liver Function Tests: Recent Labs  Lab 01/24/20 0459  AST 41  ALT 38  ALKPHOS 52  BILITOT 0.7  PROT 5.9*  ALBUMIN 3.4*   No results for input(s): LIPASE, AMYLASE in the last 168 hours. No results for input(s): AMMONIA in the last 168 hours. Coagulation Profile: Recent Labs  Lab 01/23/20 1600 01/23/20 2048  INR 1.1 1.0   Cardiac Enzymes: Recent Labs  Lab 01/23/20 2048  CKTOTAL 127   BNP (last 3 results) No results for input(s): PROBNP in the last 8760 hours. HbA1C: No results for input(s): HGBA1C in the last 72 hours. CBG: No results for input(s): GLUCAP in the last 168 hours. Lipid Profile: No results for input(s): CHOL, HDL,  LDLCALC, TRIG, CHOLHDL, LDLDIRECT in the last 72 hours. Thyroid Function Tests: No results for input(s): TSH, T4TOTAL, FREET4, T3FREE, THYROIDAB in the last 72 hours. Anemia Panel: No results for input(s): VITAMINB12, FOLATE, FERRITIN, TIBC, IRON, RETICCTPCT in the last 72 hours. Sepsis Labs: No results for input(s): PROCALCITON, LATICACIDVEN in the last 168 hours.  Recent Results (from the past 240 hour(s))  Respiratory Panel by RT PCR (Flu A&B, Covid) - Nasopharyngeal Swab     Status: None   Collection Time: 01/23/20  3:51 PM   Specimen: Nasopharyngeal Swab  Result Value Ref Range Status   SARS Coronavirus  2 by RT PCR NEGATIVE NEGATIVE Final    Comment: (NOTE) SARS-CoV-2 target nucleic acids are NOT DETECTED.  The SARS-CoV-2 RNA is generally detectable in upper respiratoy specimens during the acute phase of infection. The lowest concentration of SARS-CoV-2 viral copies this assay can detect is 131 copies/mL. A negative result does not preclude SARS-Cov-2 infection and should not be used as the sole basis for treatment or other patient management decisions. A negative result may occur with  improper specimen collection/handling, submission of specimen other than nasopharyngeal swab, presence of viral mutation(s) within the areas targeted by this assay, and inadequate number of viral copies (<131 copies/mL). A negative result must be combined with clinical observations, patient history, and epidemiological information. The expected result is Negative.  Fact Sheet for Patients:  PinkCheek.be  Fact Sheet for Healthcare Providers:  GravelBags.it  This test is no t yet approved or cleared by the [place] FDA and  has been authorized for detection and/or diagnosis of SARS-CoV-2 by FDA under an Emergency Use Authorization (EUA). This EUA will remain  in effect (meaning this test can be used) for the duration of  the COVID-19 declaration under Section 564(b)(1) of the Act, 21 U.S.C. section 360bbb-3(b)(1), unless the authorization is terminated or revoked sooner.     Influenza A by PCR NEGATIVE NEGATIVE Final   Influenza B by PCR NEGATIVE NEGATIVE Final    Comment: (NOTE) The Xpert Xpress SARS-CoV-2/FLU/RSV assay is intended as an aid in  the diagnosis of influenza from Nasopharyngeal swab specimens and  should not be used as a sole basis for treatment. Nasal washings and  aspirates are unacceptable for Xpert Xpress SARS-CoV-2/FLU/RSV  testing.  Fact Sheet for Patients: PinkCheek.be  Fact Sheet for Healthcare Providers: GravelBags.it  This test is not yet approved or cleared by the [place] FDA and  has been authorized for detection and/or diagnosis of SARS-CoV-2 by  FDA under an Emergency Use Authorization (EUA). This EUA will remain  in effect (meaning this test can be used) for the duration of the  Covid-19 declaration under Section 564(b)(1) of the Act, 21  U.S.C. section 360bbb-3(b)(1), unless the authorization is  terminated or revoked. Performed at [facility], [place] [address], [place], [place] [address]      Radiology Studies: No results found.  Scheduled Meds: . chlorthalidone  50 mg Oral Daily  . enoxaparin (LOVENOX) injection  40 mg Subcutaneous Q24H  . loratadine  10 mg Oral Daily  . losartan  100 mg Oral Daily  . metoprolol succinate  50 mg Oral QPM  . polyethylene glycol  17 g Oral Daily   Continuous Infusions: . sodium chloride 75 mL/hr at 01/24/20 1505     LOS: 4 days   Marylu Lund, MD [facility] Pager On Amion  If 7PM-7AM, please contact night-coverage 01/27/2020, 3:32 PM

## 2020-01-27 NOTE — Plan of Care (Signed)

## 2020-01-28 ENCOUNTER — Telehealth: Payer: Self-pay | Admitting: Orthopaedic Surgery

## 2020-01-28 NOTE — TOC Initial Note (Signed)
Transition of Care [facility]) - Initial/Assessment Note    Patient Details  Name: Kimberly Whitney MRN: [id number] Date of Birth: 1950-10-01  Transition of [facility]) CM/SW Contact:    Curlene Labrum, RN Phone Number: 01/28/2020, 2:03 PM  Clinical Narrative:                 Case management spoke with the patient regarding transitions of care.  Patient is waiting on admission to CIR after her mechanical fall at home.  She lives at home with her husband, Kimberly Whitney) that is planning on taking FMLA for 2 months to help care for her at home.  The patient is waiting for CIR but is open to home health if her insurance is unable to approve for admission or no beds available in CIR.  I called [facility] and they are aware of patient possible admission for CIR.  The patient will need a temporary wheelchair ramp at home.  I called [employer] and they have an available 5 foot wheelchair ramp available at the [facility] location.  I notified the husband of possible wheelchair ramp options for the home.  I will continue to follow the patient for admission to CIR.  Expected Discharge Plan: IP Rehab Facility Barriers to Discharge: Ship broker, Continued Medical Work up   Patient Goals and CMS Choice Patient states their goals for this hospitalization and ongoing recovery are:: Patient plans to be admitted to CIR - otherwise home with home health. CMS Medicare.gov Compare Post Acute Care list provided to:: Patient Choice offered to / list presented to : Patient  Expected Discharge Plan and Services Expected Discharge Plan: [place]   Discharge Planning Services: CM Consult Post Acute Care Choice: [place] arrangements for the past 2 months: Single Family Home                                      Prior Living Arrangements/Services Living arrangements for the past 2 months: Single Family Home Lives with:: Spouse Patient language and need for interpreter reviewed::  Yes Do you feel safe going back to the place where you live?: Yes      Need for Family Participation in Patient Care: Yes (Comment) Care giver support system in place?: Yes (comment)   Criminal Activity/Legal Involvement Pertinent to Current Situation/Hospitalization: No - Comment as needed  Activities of Daily Living Home Assistive Devices/Equipment: None ADL Screening (condition at time of admission) Patient's cognitive ability adequate to safely complete daily activities?: Yes Is the patient deaf or have difficulty hearing?: No Does the patient have difficulty seeing, even when wearing glasses/contacts?: No Does the patient have difficulty concentrating, remembering, or making decisions?: No Patient able to express need for assistance with ADLs?: Yes Does the patient have difficulty dressing or bathing?: No Independently performs ADLs?: Yes (appropriate for developmental age) Does the patient have difficulty walking or climbing stairs?: No Weakness of Legs: None Weakness of Arms/Hands: None  Permission Sought/Granted Permission sought to share information with : Case Manager Permission granted to share information with : Yes, Verbal Permission Granted     Permission granted to share info w AGENCY: CIR, home health agencies if needed  Permission granted to share info w Relationship: spouse - Kimberly Whitney     Emotional Assessment Appearance:: Appears stated age Attitude/Demeanor/Rapport: Gracious Affect (typically observed): Accepting Orientation: : Oriented to Self, Oriented to Place, Oriented to  Time, Oriented to Situation Alcohol / Substance Use: Not Applicable Psych Involvement: No (comment)  Admission diagnosis:  Displaced intertrochanteric fracture of left femur, initial encounter for closed fracture ([place]) [S72.142A] Displaced intertrochanteric fracture of left femur, init ([place]) [S72.142A] Patient Active Problem List   Diagnosis Date Noted  . Displaced intertrochanteric  fracture of left femur, initial encounter for closed fracture ([place]) 01/23/2020  . Essential hypertension 01/23/2020   PCP:  Pcp, No Pharmacy:   [facility] [place] - [address] [address] Phone: [phone] Fax: [phone]     Social Determinants of Health (SDOH) Interventions    Readmission Risk Interventions Readmission Risk Prevention Plan 01/28/2020  Post Dischage Appt Complete  Medication Screening Complete  Transportation Screening Complete

## 2020-01-28 NOTE — Progress Notes (Signed)
Patient pain level 1/10 however still wanting to take flexeril.   Patient states she does not want to take percocet during the night and will call the nurse if she needs it during the night.

## 2020-01-28 NOTE — Telephone Encounter (Signed)
Patients husband called checking if we received forms from [place]. I advised we have not. I gave him both our fax numbers and also my email.

## 2020-01-28 NOTE — Progress Notes (Addendum)
Inpatient Rehabilitation Admissions Coordinator  Insurance authorization was begun by Scherrie Gerlach 9/27 for  A possible CIR admit. I await their determination.  Danne Baxter, RN, MSN Rehab Admissions Coordinator [phone] 01/28/2020 9:07 AM   I met with patient and her spouse at bedside and they are aware.  Danne Baxter, RN, MSN Rehab Admissions Coordinator [phone] 01/28/2020 12:26 PM

## 2020-01-28 NOTE — Progress Notes (Signed)
Physical Therapy Treatment Patient Details Name: Kimberly Whitney MRN: [id number] DOB: Jun 28, 1950 Today's Date: 01/28/2020    History of Present Illness Pt is a 69 y.o. F with significant PMH of hypertension, osteopenia, and L hip fracture 30 years ago who presents after a fall wtih a left femoral fracture now s/p open treatment with intramedullary implant.      PT Comments    Patient received on BSC, pleasant and cooperative with therapies. Once RW was lowered a bit more, she was actually able to come to full standing position with MinAx2 and maintained balance with min guard for pericare. After going back to sitting on BSC, she began to feel very light headed and became pale- HR 91, SpO2 99% on RA, unable to get BP in sitting due to her tensing and constantly moving her arm. Able to perform transfer back to bed with Min-ModAx2, once returned to supine with HOB elevated, BP 105/55 and symptoms began resolving. RN alerted regarding medical events during this session. She was left in bed with all needs met, bed alarm active and spouse present. Continue to recommend CIR.    Follow Up Recommendations  CIR     Equipment Recommendations  Rolling walker with 5" wheels;3in1 (PT)    Recommendations for Other Services       Precautions / Restrictions Precautions Precautions: Fall;Other (comment) Precaution Comments: Anxious with mobility Restrictions Weight Bearing Restrictions: Yes LLE Weight Bearing: Partial weight bearing LLE Partial Weight Bearing Percentage or Pounds: 25%    Mobility  Bed Mobility Overal bed mobility: Needs Assistance Bed Mobility: Sit to Supine       Sit to supine: Mod assist;+2 for physical assistance   General bed mobility comments: to manage BLEs and trunk  Transfers Overall transfer level: Needs assistance Equipment used: Rolling walker (2 wheeled) Transfers: Sit to/from [employer] Transfers Sit to Stand: Min assist;+2 physical assistance Stand  pivot transfers: Min assist;+2 physical assistance       General transfer comment: MinAx2 for all functional transfers today with heavy coaching and cues for sequencing; did much better with RW lowered and had more UE muscle activation  Ambulation/Gait                 Stairs             Wheelchair Mobility    Modified Rankin (Stroke Patients Only)       Balance Overall balance assessment: Needs assistance Sitting-balance support: Feet supported;Bilateral upper extremity supported Sitting balance-Leahy Scale: Fair Sitting balance - Comments: reliant on BUE support   Standing balance support: Bilateral upper extremity supported Standing balance-Leahy Scale: Poor Standing balance comment: reliant on BUE support                            Cognition Arousal/Alertness: Awake/alert Behavior During Therapy: WFL for tasks assessed/performed;Anxious Overall Cognitive Status: Within Functional Limits for tasks assessed                                 General Comments: very anxious with movement in general, very particular      Exercises      General Comments        Pertinent Vitals/Pain Pain Assessment: Faces Faces Pain Scale: Hurts little more Pain Location: L hip Pain Descriptors / Indicators: Sharp;Operative site guarding;Grimacing;Throbbing;Spasm Pain Intervention(s): Monitored during session;Limited activity within patient's tolerance;Repositioned  Home Living                      Prior Function            PT Goals (current goals can now be found in the care plan section) Acute Rehab PT Goals Patient Stated Goal: less pain, return home PT Goal Formulation: With patient/family Time For Goal Achievement: 02/07/20 Potential to Achieve Goals: Good Progress towards PT goals: Progressing toward goals    Frequency    Min 5X/week      PT Plan Current plan remains appropriate    Co-evaluation               AM-PAC PT "6 Clicks" Mobility   Outcome Measure  Help needed turning from your back to your side while in a flat bed without using bedrails?: A Little Help needed moving from lying on your back to sitting on the side of a flat bed without using bedrails?: A Lot Help needed moving to and from a bed to a chair (including a wheelchair)?: A Lot Help needed standing up from a chair using your arms (e.g., wheelchair or bedside chair)?: A Lot Help needed to walk in hospital room?: Total Help needed climbing 3-5 steps with a railing? : Total 6 Click Score: 11    End of Session Equipment Utilized During Treatment: Gait belt Activity Tolerance: Patient tolerated treatment well Patient left: in bed;with call bell/phone within reach;with bed alarm set Nurse Communication: Mobility status PT Visit Diagnosis: Pain;Other abnormalities of gait and mobility (R26.89);Difficulty in walking, not elsewhere classified (R26.2) Pain - Right/Left: Left Pain - part of body: Hip     Time: 6160-7371 PT Time Calculation (min) (ACUTE ONLY): 23 min  Charges:  $Therapeutic Activity: 23-37 mins                     Windell Norfolk, DPT, PN1   Supplemental Physical Therapist [place]    Pager [phone] Acute Rehab Office [phone]

## 2020-01-28 NOTE — Progress Notes (Signed)
Inpatient Rehabilitation Admissions Coordinator  I have received approval for CIR admit, but no bed today. I have made patient and her spouse aware. I will follow up tomorrow to clarify If/when bed likely to be available for her to admit.  Danne Baxter, RN, MSN Rehab Admissions Coordinator [phone] 01/28/2020 2:38 PM

## 2020-01-28 NOTE — Plan of Care (Signed)

## 2020-01-28 NOTE — Progress Notes (Addendum)
PROGRESS NOTE    Kimberly Whitney  RAQ:[id number] DOB: 1950/08/08 DOA: 01/23/2020 PCP: Pcp, No    Brief Narrative:  69 y.o. female with medical history significant of hypertension who was in her kitchen got on a stool ladder and missed her steps.  She fell and landed on her left side.  Patient sustained a left femoral fracture.  Seen in the ED and evaluated.  She was subsequently admitted to the hospital after orthopedic consult.  She had no known history of coronary artery disease.  No chest pain.  Patient denied any fever or chills.  No other injuries.  It was purely mechanical fall.  Patient seen by EMS and brought to the hospital where she was found to have the fracture.  No other complaint.  She has had previous left hip fracture with temporary hardware that was removed many years ago.  Dr. Sherrian Divers has seen the patient and plan surgery early tomorrow morning and we are admitting the patient for further work-up..  ED Course: Temperature 98.8 blood pressure 160s over 77 pulse 80 respiratory rate 19 oxygen sat 91% room air.  White count 14.9 hemoglobin 11.4 and platelets 243.  Sodium 133 potassium 3.3 chloride 98 and creatinine 0.71.  COVID-19 screen is negative.  Chest x-ray showed no acute findings.  X-ray of the left hip showed comminuted intertrochanteric left hip fracture with varus angulation.  Patient is being admitted to the hospital for further treatment by orthopedics.  Assessment & Plan:   Principal Problem:   Displaced intertrochanteric fracture of left femur, initial encounter for closed fracture [facility]) Active Problems:   Essential hypertension   #1 left intertrochanteric fracture:  -Orthopedic Surs/p mechanical fallgery was consulted and pt is now s/p surgery 9/24 -PT/OT consulted, recommendation for CIR -Will cont with analgesia as needed. Seems stable currently -PT/OT rec for CIR, possible d/c in the next 24hrs  2 status post fall:  -Appears mechanical in nature.   -PT/OT  consulted per above, recommendation for CIR/SNF noted  #3 hypertension:  -BP stable at present -Continue home regimen as pt tolerates  #4 Anxiety -Much improved with trial of PRN vistaril, will continue  #5 Constipation -Pt seen struggling to have BM this visit -Will continue with cathartics as tolerated -Will continue on scheduled stool softener with senna -Still no BM thus far. Have ordered enema as needed  DVT prophylaxis: SCD's Code Status: Full Family Communication: Pt in room, family at bedside  Status is: Inpatient  Remains inpatient appropriate because:Unsafe d/c plan and Inpatient level of care appropriate due to severity of illness   Dispo: The patient is from: Home              Anticipated d/c is to: CIR              Anticipated d/c date is: 1 day              Patient currently is medically stable to d/c. Awaiting CIR placement   Consultants:   Orthopedic Surgery  CIR  Procedures:   Open treatment of subtrochanteric fx with intramedullary implant 9/24  Antimicrobials: Anti-infectives (From admission, onward)   Start     Dose/Rate Route Frequency Ordered Stop   01/23/20 2115  ceFAZolin (ANCEF) IVPB 2g/100 mL premix  Status:  Discontinued        2 g 200 mL/hr over 30 Minutes Intravenous On call to O.R. 01/23/20 2023 01/23/20 2047   01/23/20 2115  ceFAZolin (ANCEF) IVPB 2g/100 mL premix  2 g 200 mL/hr over 30 Minutes Intravenous On call to O.R. 01/23/20 2046 01/23/20 2243      Subjective: Still no bowel movement since 9/23  Objective: Vitals:   01/27/20 1926 01/28/20 0335 01/28/20 0812 01/28/20 1404  BP: (!) 102/54 (!) 114/56 (!) 121/53 122/60  Pulse: 72 66 73 76  Resp: 16 16 16 17   Temp: 97.9 F (36.6 C) 97.9 F (36.6 C) 98.3 F (36.8 C) 98.2 F (36.8 C)  TempSrc: Oral Oral Oral Oral  SpO2: 96% 96% 96% 97%  Weight:      Height:        Intake/Output Summary (Last 24 hours) at 01/28/2020 1657 Last data filed at 01/28/2020  0900 Gross per 24 hour  Intake 360 ml  Output --  Net 360 ml   Filed Weights   01/23/20 1513  Weight: 82.6 kg    Examination: General exam: Conversant, in no acute distress Respiratory system: normal chest rise, clear, no audible wheezing Cardiovascular system: regular rhythm, s1-s2 Gastrointestinal system: Nondistended, nontender, pos BS Central nervous system: No seizures, no tremors Extremities: No cyanosis, no joint deformities Skin: No rashes, no pallor Psychiatry: Affect normal // no auditory hallucinations   Data Reviewed: I have personally reviewed following labs and imaging studies  CBC: Recent Labs  Lab 01/23/20 1600 01/23/20 2048 01/24/20 0459 01/27/20 1422  WBC 18.8* 13.4* 14.9* 10.1  NEUTROABS 17.2* 11.4*  --   --   HGB 13.3 12.6 11.4* 10.8*  HCT 38.7 35.9* 33.4* 32.3*  MCV 90.6 90.2 92.3 94.7  PLT 288 243 208 601   Basic Metabolic Panel: Recent Labs  Lab 01/23/20 1600 01/23/20 2048 01/24/20 0459 01/26/20 0232 01/27/20 1422  NA 133* 133* 134* 133*  --   K 3.0* 3.3* 3.2* 3.5  --   CL 96* 98 98 97*  --   CO2 22 24 25 28   --   GLUCOSE 157* 133* 149* 108*  --   BUN 14 13 9 11   --   CREATININE 0.80 0.71 0.75 0.75 0.74  CALCIUM 9.2 8.4* 8.1* 8.3*  --   MG  --   --  1.8  --   --    GFR: Estimated Creatinine Clearance: 72.3 mL/min (by C-G formula based on SCr of 0.74 mg/dL). Liver Function Tests: Recent Labs  Lab 01/24/20 0459  AST 41  ALT 38  ALKPHOS 52  BILITOT 0.7  PROT 5.9*  ALBUMIN 3.4*   No results for input(s): LIPASE, AMYLASE in the last 168 hours. No results for input(s): AMMONIA in the last 168 hours. Coagulation Profile: Recent Labs  Lab 01/23/20 1600 01/23/20 2048  INR 1.1 1.0   Cardiac Enzymes: Recent Labs  Lab 01/23/20 2048  CKTOTAL 127   BNP (last 3 results) No results for input(s): PROBNP in the last 8760 hours. HbA1C: No results for input(s): HGBA1C in the last 72 hours. CBG: No results for input(s): GLUCAP  in the last 168 hours. Lipid Profile: No results for input(s): CHOL, HDL, LDLCALC, TRIG, CHOLHDL, LDLDIRECT in the last 72 hours. Thyroid Function Tests: No results for input(s): TSH, T4TOTAL, FREET4, T3FREE, THYROIDAB in the last 72 hours. Anemia Panel: No results for input(s): VITAMINB12, FOLATE, FERRITIN, TIBC, IRON, RETICCTPCT in the last 72 hours. Sepsis Labs: No results for input(s): PROCALCITON, LATICACIDVEN in the last 168 hours.  Recent Results (from the past 240 hour(s))  Respiratory Panel by RT PCR (Flu A&B, Covid) - Nasopharyngeal Swab     Status: None  Collection Time: 01/23/20  3:51 PM   Specimen: Nasopharyngeal Swab  Result Value Ref Range Status   SARS Coronavirus 2 by RT PCR NEGATIVE NEGATIVE Final    Comment: (NOTE) SARS-CoV-2 target nucleic acids are NOT DETECTED.  The SARS-CoV-2 RNA is generally detectable in upper respiratoy specimens during the acute phase of infection. The lowest concentration of SARS-CoV-2 viral copies this assay can detect is 131 copies/mL. A negative result does not preclude SARS-Cov-2 infection and should not be used as the sole basis for treatment or other patient management decisions. A negative result may occur with  improper specimen collection/handling, submission of specimen other than nasopharyngeal swab, presence of viral mutation(s) within the areas targeted by this assay, and inadequate number of viral copies (<131 copies/mL). A negative result must be combined with clinical observations, patient history, and epidemiological information. The expected result is Negative.  Fact Sheet for Patients:  PinkCheek.be  Fact Sheet for Healthcare Providers:  GravelBags.it  This test is no t yet approved or cleared by the [place] FDA and  has been authorized for detection and/or diagnosis of SARS-CoV-2 by FDA under an Emergency Use Authorization (EUA). This EUA will remain   in effect (meaning this test can be used) for the duration of the COVID-19 declaration under Section 564(b)(1) of the Act, 21 U.S.C. section 360bbb-3(b)(1), unless the authorization is terminated or revoked sooner.     Influenza A by PCR NEGATIVE NEGATIVE Final   Influenza B by PCR NEGATIVE NEGATIVE Final    Comment: (NOTE) The Xpert Xpress SARS-CoV-2/FLU/RSV assay is intended as an aid in  the diagnosis of influenza from Nasopharyngeal swab specimens and  should not be used as a sole basis for treatment. Nasal washings and  aspirates are unacceptable for Xpert Xpress SARS-CoV-2/FLU/RSV  testing.  Fact Sheet for Patients: PinkCheek.be  Fact Sheet for Healthcare Providers: GravelBags.it  This test is not yet approved or cleared by the [place] FDA and  has been authorized for detection and/or diagnosis of SARS-CoV-2 by  FDA under an Emergency Use Authorization (EUA). This EUA will remain  in effect (meaning this test can be used) for the duration of the  Covid-19 declaration under Section 564(b)(1) of the Act, 21  U.S.C. section 360bbb-3(b)(1), unless the authorization is  terminated or revoked. Performed at [facility], [place] [address], [place], [place] [address]      Radiology Studies: No results found.  Scheduled Meds: . chlorthalidone  50 mg Oral Daily  . docusate sodium  100 mg Oral BID  . enoxaparin (LOVENOX) injection  40 mg Subcutaneous Q24H  . loratadine  10 mg Oral Daily  . losartan  100 mg Oral Daily  . metoprolol succinate  50 mg Oral QPM  . polyethylene glycol  17 g Oral Daily  . senna  1 tablet Oral Daily   Continuous Infusions: . sodium chloride 75 mL/hr at 01/24/20 1505     LOS: 5 days   Marylu Lund, MD [facility] Pager On Amion  If 7PM-7AM, please contact night-coverage 01/28/2020, 4:57 PM

## 2020-01-29 ENCOUNTER — Inpatient Hospital Stay (HOSPITAL_COMMUNITY)
Admission: RE | Admit: 2020-01-29 | Discharge: 2020-02-04 | DRG: 560 | Disposition: A | Discharge status: Home Health | Payer: BC Managed Care – PPO | Source: Intra-hospital | Attending: Physical Medicine & Rehabilitation | Admitting: Physical Medicine & Rehabilitation

## 2020-01-29 ENCOUNTER — Telehealth: Payer: Self-pay | Admitting: Orthopaedic Surgery

## 2020-01-29 ENCOUNTER — Other Ambulatory Visit: Payer: Self-pay

## 2020-01-29 ENCOUNTER — Encounter (HOSPITAL_COMMUNITY): Payer: Self-pay | Admitting: Physical Medicine & Rehabilitation

## 2020-01-29 DIAGNOSIS — E876 Hypokalemia: Secondary | ICD-10-CM | POA: Diagnosis not present

## 2020-01-29 DIAGNOSIS — S72145D Nondisplaced intertrochanteric fracture of left femur, subsequent encounter for closed fracture with routine healing: Secondary | ICD-10-CM

## 2020-01-29 DIAGNOSIS — R7401 Elevation of levels of liver transaminase levels: Secondary | ICD-10-CM | POA: Diagnosis present

## 2020-01-29 DIAGNOSIS — I10 Essential (primary) hypertension: Secondary | ICD-10-CM | POA: Diagnosis present

## 2020-01-29 DIAGNOSIS — R110 Nausea: Secondary | ICD-10-CM | POA: Diagnosis not present

## 2020-01-29 DIAGNOSIS — D62 Acute posthemorrhagic anemia: Secondary | ICD-10-CM | POA: Diagnosis present

## 2020-01-29 DIAGNOSIS — I952 Hypotension due to drugs: Secondary | ICD-10-CM

## 2020-01-29 DIAGNOSIS — S72145A Nondisplaced intertrochanteric fracture of left femur, initial encounter for closed fracture: Secondary | ICD-10-CM

## 2020-01-29 DIAGNOSIS — Z888 Allergy status to other drugs, medicaments and biological substances status: Secondary | ICD-10-CM

## 2020-01-29 DIAGNOSIS — Z9049 Acquired absence of other specified parts of digestive tract: Secondary | ICD-10-CM | POA: Diagnosis not present

## 2020-01-29 DIAGNOSIS — Z87891 Personal history of nicotine dependence: Secondary | ICD-10-CM

## 2020-01-29 DIAGNOSIS — G8918 Other acute postprocedural pain: Secondary | ICD-10-CM

## 2020-01-29 DIAGNOSIS — K5903 Drug induced constipation: Secondary | ICD-10-CM | POA: Diagnosis present

## 2020-01-29 DIAGNOSIS — Z79899 Other long term (current) drug therapy: Secondary | ICD-10-CM | POA: Diagnosis not present

## 2020-01-29 DIAGNOSIS — E871 Hypo-osmolality and hyponatremia: Secondary | ICD-10-CM

## 2020-01-29 DIAGNOSIS — F419 Anxiety disorder, unspecified: Secondary | ICD-10-CM | POA: Diagnosis present

## 2020-01-29 DIAGNOSIS — S72142A Displaced intertrochanteric fracture of left femur, initial encounter for closed fracture: Secondary | ICD-10-CM

## 2020-01-29 DIAGNOSIS — W108XXD Fall (on) (from) other stairs and steps, subsequent encounter: Secondary | ICD-10-CM | POA: Diagnosis present

## 2020-01-29 DIAGNOSIS — R42 Dizziness and giddiness: Secondary | ICD-10-CM | POA: Diagnosis not present

## 2020-01-29 DIAGNOSIS — M7989 Other specified soft tissue disorders: Secondary | ICD-10-CM | POA: Diagnosis not present

## 2020-01-29 DIAGNOSIS — S72142D Displaced intertrochanteric fracture of left femur, subsequent encounter for closed fracture with routine healing: Secondary | ICD-10-CM | POA: Diagnosis present

## 2020-01-29 DIAGNOSIS — T465X5A Adverse effect of other antihypertensive drugs, initial encounter: Secondary | ICD-10-CM | POA: Diagnosis not present

## 2020-01-29 DIAGNOSIS — M8580 Other specified disorders of bone density and structure, unspecified site: Secondary | ICD-10-CM | POA: Diagnosis present

## 2020-01-29 DIAGNOSIS — Y92239 Unspecified place in hospital as the place of occurrence of the external cause: Secondary | ICD-10-CM | POA: Diagnosis not present

## 2020-01-29 HISTORY — DX: Displaced intertrochanteric fracture of left femur, initial encounter for closed fracture: S72.142A

## 2020-01-29 MED ORDER — ACETAMINOPHEN 650 MG RE SUPP: 650.0000 mg | Freq: Four times a day (QID) | RECTAL | Status: DC | PRN

## 2020-01-29 MED ORDER — BISACODYL 10 MG RE SUPP: 10.0000 mg | Freq: Every day | RECTAL | Status: DC | PRN

## 2020-01-29 MED ORDER — ONDANSETRON HCL 4 MG/2ML IJ SOLN: 4.0000 mg | Freq: Four times a day (QID) | INTRAMUSCULAR | Status: DC | PRN

## 2020-01-29 MED ORDER — ENOXAPARIN SODIUM 40 MG/0.4ML ~~LOC~~ SOLN
40.0000 mg | SUBCUTANEOUS | Status: DC
  Administered 2020-01-30 – 2020-02-04 (×6): 40 mg via SUBCUTANEOUS
  Filled 2020-01-29 (×6): qty 0.4

## 2020-01-29 MED ORDER — ACETAMINOPHEN 325 MG PO TABS
650.0000 mg | ORAL_TABLET | Freq: Four times a day (QID) | ORAL | Status: DC | PRN
  Administered 2020-01-30: 650 mg via ORAL
  Filled 2020-01-29 (×2): qty 2

## 2020-01-29 MED ORDER — HYDROXYZINE HCL 10 MG PO TABS
10.0000 mg | ORAL_TABLET | ORAL | Status: DC | PRN
  Administered 2020-01-29 – 2020-02-02 (×5): 10 mg via ORAL
  Filled 2020-01-29 (×8): qty 1

## 2020-01-29 MED ORDER — OXYCODONE-ACETAMINOPHEN 5-325 MG PO TABS
1.0000 | ORAL_TABLET | ORAL | Status: DC | PRN
  Filled 2020-01-29 (×3): qty 1

## 2020-01-29 MED ORDER — CHLORTHALIDONE 25 MG PO TABS
50.0000 mg | ORAL_TABLET | Freq: Every day | ORAL | Status: DC
  Administered 2020-01-30 – 2020-02-04 (×6): 50 mg via ORAL
  Filled 2020-01-29 (×6): qty 2

## 2020-01-29 MED ORDER — SORBITOL 70 % SOLN: 30.0000 mL | Freq: Every day | Status: DC | PRN

## 2020-01-29 MED ORDER — SENNA 8.6 MG PO TABS
1.0000 | ORAL_TABLET | Freq: Every day | ORAL | Status: DC
  Administered 2020-01-30 – 2020-02-03 (×3): 8.6 mg via ORAL
  Filled 2020-01-29 (×6): qty 1

## 2020-01-29 MED ORDER — ENOXAPARIN SODIUM 40 MG/0.4ML ~~LOC~~ SOLN: 40.0000 mg | SUBCUTANEOUS | Status: DC

## 2020-01-29 MED ORDER — DOCUSATE SODIUM 100 MG PO CAPS
100.0000 mg | ORAL_CAPSULE | Freq: Two times a day (BID) | ORAL | Status: DC
  Administered 2020-01-29 – 2020-02-04 (×8): 100 mg via ORAL
  Filled 2020-01-29 (×10): qty 1

## 2020-01-29 MED ORDER — METOPROLOL SUCCINATE ER 50 MG PO TB24
50.0000 mg | ORAL_TABLET | Freq: Every evening | ORAL | Status: DC
  Administered 2020-01-30 – 2020-02-03 (×3): 50 mg via ORAL
  Filled 2020-01-29 (×4): qty 1

## 2020-01-29 MED ORDER — POLYETHYLENE GLYCOL 3350 17 G PO PACK
17.0000 g | PACK | Freq: Every day | ORAL | Status: DC
  Administered 2020-02-03: 17 g via ORAL
  Filled 2020-01-29 (×6): qty 1

## 2020-01-29 MED ORDER — LORATADINE 10 MG PO TABS
10.0000 mg | ORAL_TABLET | Freq: Every day | ORAL | Status: DC
  Administered 2020-01-30 – 2020-02-04 (×6): 10 mg via ORAL
  Filled 2020-01-29 (×6): qty 1

## 2020-01-29 MED ORDER — LOSARTAN POTASSIUM 50 MG PO TABS
100.0000 mg | ORAL_TABLET | Freq: Every day | ORAL | Status: DC
  Administered 2020-01-30 – 2020-02-02 (×4): 100 mg via ORAL
  Filled 2020-01-29 (×4): qty 2

## 2020-01-29 MED ORDER — CYCLOBENZAPRINE HCL 5 MG PO TABS
5.0000 mg | ORAL_TABLET | Freq: Three times a day (TID) | ORAL | Status: DC | PRN
  Administered 2020-01-29 – 2020-02-04 (×9): 5 mg via ORAL
  Filled 2020-01-29 (×10): qty 1

## 2020-01-29 MED ORDER — ONDANSETRON HCL 4 MG PO TABS
4.0000 mg | ORAL_TABLET | Freq: Four times a day (QID) | ORAL | Status: DC | PRN
  Administered 2020-01-30: 4 mg via ORAL
  Filled 2020-01-29: qty 1

## 2020-01-29 NOTE — Progress Notes (Addendum)
Inpatient Rehabilitation Admissions Coordinator  I have CIR bed and insurance approval to admit patient to Cir today. I met with patient and her spouse at bedside and they are in agreement. I contacted Dr. Jamse Arn, acute team and TOC. I will make the arrangements to admit today.  Danne Baxter, RN, MSN Rehab Admissions Coordinator [phone] 01/29/2020 10:05 AM

## 2020-01-29 NOTE — Progress Notes (Signed)
Jamse Arn, MD  Physician  Physical Medicine and Rehabilitation  PMR Pre-admission     Signed  Date of Service:  01/25/2020 11:39 AM      Related encounter: ED to Hosp-Admission (Current) from 01/23/2020 in [place]       Show:Clear all _0 Manual_1 Template_2 Copied  Added by: _3 Cristina Gong, RN_4 Lind Covert, Lauren Mamie Nick, CCC-SLP_5 Jamse Arn, MD  _6 Hover for details PMR Admission Coordinator Pre-Admission Assessment   Patient: Kimberly Whitney is an 69 y.o., female MRN: [id number] DOB: 02/01/1951 Height: 5' 5.5" (166.4 cm) Weight: 82.6 kg   Insurance Information HMO:     PPO: yes     PCP:      IPA:      80/20:      OTHER:  PRIMARYLorella [place] of [place]      Policy#: [id number]      Subscriber: patient CM Name:  Janett Billow     Phone#:   [phone] ext [phone]   Fax#: [phone] Pre-Cert#: [id number] approved for 7 days     Employer:  Benefits:  Phone #: [phone]     Name: Opened case with Oneita Kras and Emmit Pomfret. Date: 05/02/2018    Deduct: $5,500       Out of Pocket Max: $6,850       Life Max: NA CIR: 75% coverage; 25% co-insurance      SNF: 75% coverage; 25% co-insurance Outpatient: 75% coverage; 25% co-insurance; $75/day co-pay; limited to 20 visits each of PT/OT/ST     Co-Pay:  Home Health:75% coverage; 25% co-insurance; limited with medical necessity       Co-Pay:  DME: 75% coverage; 25% co-insurance     Co-Pay:  Providers: in-network SECONDARY: Medicare A      Policy#: [id number]     Phone#: active 06/02/2016   Financial Counselor:       Phone#:    The "Data Collection Information Summary" for patients in Inpatient Rehabilitation Facilities with attached "Privacy Act [place] Records" was provided and verbally reviewed with: patient and family   Emergency Contact Information         Contact Information     Name Relation Home Work [place] Spouse [phone]    [phone]    Tetter,Brooke Daughter     [phone]         Current Medical History  Patient Admitting Diagnosis: Left femoral fx s/p IM nail   History of Present Illness: 69 year old female with medical history significant for HTN. Presented on 01/23/2020 after falling in her kitchen from a stool ladder and missed her step. Sustained a left femoral fracture.    Orthopedic surgery was consulted and patient s/p IM nailing on 9/24. BP stable and to continue home regimen. Anxiety improved with trial of prn vistaril. Postoperative constipation to continue cathartics as tolerated and stool softener with senna. Maintained on Lovenox for DVT prophylaxis. Acute blood loss anemia 10/8 and monitored.    Patient's medical record from [facility] has been reviewed by the rehabilitation admission coordinator and physician.   Past Medical History      Past Medical History:  Diagnosis Date  . Hypertension    . Osteopenia        Family History   family history is not on file.   Prior Rehab/Hospitalizations Has the patient had prior rehab or hospitalizations prior to admission? No   Has the patient had major surgery during 100  days prior to admission? Yes              Current Medications   Current Facility-Administered Medications:  .  0.9 %  sodium chloride infusion, , Intravenous, Continuous, Garba, Mohammad L, MD, Last Rate: 75 mL/hr at 01/24/20 1505, Restarted at 01/24/20 1505 .  acetaminophen (TYLENOL) tablet 650 mg, 650 mg, Oral, Q6H PRN, 650 mg at 01/26/20 1059 **OR** acetaminophen (TYLENOL) suppository 650 mg, 650 mg, Rectal, Q6H PRN, Jonelle Sidle, Mohammad L, MD .  bisacodyl (DULCOLAX) suppository 10 mg, 10 mg, Rectal, Daily PRN, Donne Hazel, MD .  chlorthalidone (HYGROTON) tablet 50 mg, 50 mg, Oral, Daily, Gala Romney L, MD, 50 mg at 01/29/20 0934 .  cyclobenzaprine (FLEXERIL) tablet 5 mg, 5 mg, Oral, TID PRN, Donne Hazel, MD, 5 mg at 01/28/20 2117 .  docusate sodium  (COLACE) capsule 100 mg, 100 mg, Oral, BID, Donne Hazel, MD, 100 mg at 01/29/20 0935 .  enoxaparin (LOVENOX) injection 40 mg, 40 mg, Subcutaneous, Q24H, Donne Hazel, MD, 40 mg at 01/29/20 0939 .  HYDROmorphone (DILAUDID) injection 0.5 mg, 0.5 mg, Intravenous, Q3H PRN, Donne Hazel, MD .  hydrOXYzine (ATARAX/VISTARIL) tablet 10 mg, 10 mg, Oral, Q4H PRN, Donne Hazel, MD, 10 mg at 01/29/20 0935 .  loratadine (CLARITIN) tablet 10 mg, 10 mg, Oral, Daily, Gala Romney L, MD, 10 mg at 01/29/20 0934 .  losartan (COZAAR) tablet 100 mg, 100 mg, Oral, Daily, Jonelle Sidle, Mohammad L, MD, 100 mg at 01/29/20 0935 .  metoprolol succinate (TOPROL-XL) 24 hr tablet 50 mg, 50 mg, Oral, QPM, Garba, Mohammad L, MD, 50 mg at 01/28/20 1758 .  ondansetron (ZOFRAN) tablet 4 mg, 4 mg, Oral, Q6H PRN **OR** ondansetron (ZOFRAN) injection 4 mg, 4 mg, Intravenous, Q6H PRN, Jonelle Sidle, Mohammad L, MD .  oxyCODONE-acetaminophen (PERCOCET/ROXICET) 5-325 MG per tablet 1 tablet, 1 tablet, Oral, Q4H PRN, Donne Hazel, MD, 1 tablet at 01/29/20 [phone] .  polyethylene glycol (MIRALAX / GLYCOLAX) packet 17 g, 17 g, Oral, Daily, Donne Hazel, MD, 17 g at 01/29/20 0934 .  senna (SENOKOT) tablet 8.6 mg, 1 tablet, Oral, Daily, Donne Hazel, MD, 8.6 mg at 01/29/20 0947   Patients Current Diet:     Diet Order                      Diet Heart Room service appropriate? Yes; Fluid consistency: Thin  Diet effective now                      Precautions / Restrictions Precautions Precautions: Fall, Other (comment) Precaution Comments: Anxious with mobility Restrictions Weight Bearing Restrictions: Yes LLE Weight Bearing: Partial weight bearing LLE Partial Weight Bearing Percentage or Pounds: 25%    Has the patient had 2 or more falls or a fall with injury in the past year? Yes   Prior Activity Level Limited Community (1-2x/wk): drives, watches grandchildren   Prior Functional Level Self Care: Did the patient need  help bathing, dressing, using the toilet or eating? Independent   Indoor Mobility: Did the patient need assistance with walking from room to room (with or without device)? Independent   Stairs: Did the patient need assistance with internal or external stairs (with or without device)? Independent   Functional Cognition: Did the patient need help planning regular tasks such as shopping or remembering to take medications? Independent   Home Assistive Devices / Equipment Home Assistive Devices/Equipment: None Home Equipment:  Wheelchair - manual, Shower seat - built in   Prior Device Use: Indicate devices/aids used by the patient prior to current illness, exacerbation or injury? None of the above   Current Functional Level Cognition   Overall Cognitive Status: Within Functional Limits for tasks assessed Orientation Level: Oriented X4 General Comments: very anxious with movement in general, very particular    Extremity Assessment (includes Sensation/Coordination)   Upper Extremity Assessment: Overall WFL for tasks assessed  Lower Extremity Assessment: Defer to PT evaluation RLE Deficits / Details: WFL LLE Deficits / Details: Grossly 2/5, resistive of movement due to pain     ADLs   Overall ADL's : Needs assistance/impaired Eating/Feeding: Independent Grooming: Wash/dry hands, Wash/dry face, Sitting, Min guard Upper Body Bathing: Sitting, Minimal assistance Lower Body Bathing: Maximal assistance, Bed level Upper Body Dressing : Minimal assistance, Sitting Lower Body Dressing: Total assistance, Bed level Lower Body Dressing Details (indicate cue type and reason): to adjust socks Toilet Transfer: Moderate assistance, +2 for safety/equipment, +2 for physical assistance, BSC, Stand-pivot, RW Toilet Transfer Details (indicate cue type and reason): pt able to stand pivot from EOB>BSC>recliner with MOD- MAX A. MAX A +2 to power up into standing and MOD A +2 to pivot. pt requires step by step  cues in relation to body mechanics Toileting- Clothing Manipulation and Hygiene: Total assistance, +2 for physical assistance, Sit to/from stand Toileting - Clothing Manipulation Details (indicate cue type and reason): anterior pericare in standing Functional mobility during ADLs: Moderate assistance, Maximal assistance, +2 for physical assistance, +2 for safety/equipment, Rolling walker General ADL Comments: pt able to progress OOB to recliner with MOD- MAX A +2 to pivot with RW. pt limited by pain and anxiety with movement but overall progressing from previous session     Mobility   Overal bed mobility: Needs Assistance Bed Mobility: Sit to Supine Supine to sit: Mod assist, +2 for safety/equipment, HOB elevated Sit to supine: Mod assist, +2 for physical assistance General bed mobility comments: to manage BLEs and trunk     Transfers   Overall transfer level: Needs assistance Equipment used: Rolling walker (2 wheeled) Transfers: Sit to/from Stand, Stand Pivot Transfers Sit to Stand: Min assist, +2 physical assistance Stand pivot transfers: Min assist, +2 physical assistance General transfer comment: MinAx2 for all functional transfers today with heavy coaching and cues for sequencing; did much better with RW lowered and had more UE muscle activation     Ambulation / Gait / Stairs / Wheelchair Mobility   Ambulation/Gait General Gait Details: declned     Posture / Balance Dynamic Sitting Balance Sitting balance - Comments: reliant on BUE support Balance Overall balance assessment: Needs assistance Sitting-balance support: Feet supported, Bilateral upper extremity supported Sitting balance-Leahy Scale: Fair Sitting balance - Comments: reliant on BUE support Standing balance support: Bilateral upper extremity supported Standing balance-Leahy Scale: Poor Standing balance comment: reliant on BUE support     Special needs/care consideration  Visitors will be spouse, Kristin Bruins and  daughter, Jerene Pitch Anxiety noted since fall [place] arranged    Previous Home Environment  Living Arrangements: Spouse/significant other  Lives With: Spouse Available Help at Discharge: Family Type of Home: House Home Layout: One level Home Access: Stairs to enter Entrance Stairs-Rails: None Technical brewer of Steps: 1 Bathroom Shower/Tub: Multimedia programmer: Handicapped height Bathroom Accessibility: Yes How Accessible: Accessible via walker [place]: No   Discharge Living Setting Plans for Discharge Living Setting: Patient's home Type of Home at Discharge: [facility] Discharge  Home Layout: One level Discharge Home Access: Stairs to enter Entrance Stairs-Rails: None Entrance Stairs-Number of Steps: 1 Discharge Bathroom Shower/Tub: Walk-in shower Discharge Bathroom Toilet: Handicapped height Discharge Bathroom Accessibility: Yes How Accessible: Accessible via walker Does the patient have any problems obtaining your medications?: No   Social/Family/Support Systems Patient Roles: Spouse, Other (Comment) (helps take care of grandchildren) Anticipated Caregiver: Virna Livengood, husband; Moishe Spice, daughter Anticipated Ambulance person Information: [phone]; [phone] Discharge Plan Discussed with Primary Caregiver: Yes Is Caregiver In Agreement with Plan?: Yes Does Caregiver/Family have Issues with Lodging/Transportation while Pt is in Rehab?: No   Goals Pt/Family Agrees to Admission and willing to participate: Yes Program Orientation Provided & Reviewed with Pt/Caregiver Including Roles  & Responsibilities: Yes   Decrease burden of Care through IP rehab admission: NA   Possible need for SNF placement upon discharge: NA   Patient Condition: I have reviewed medical [facility], spoken with CM, and patient and spouse. I met with patient at the bedside for inpatient rehabilitation assessment.  Patient  will benefit from ongoing PT and OT, can actively participate in 3 hours of therapy a day 5 days of the week, and can make measurable gains during the admission.  Patient will also benefit from the coordinated team approach during an Inpatient Acute Rehabilitation admission.  The patient will receive intensive therapy as well as Rehabilitation physician, nursing, social worker, and care management interventions.  Due to bladder management, bowel management, safety, skin/wound care, disease management, medication administration, pain management and patient education the patient requires 24 hour a day rehabilitation nursing.  The patient is currently mod assist with mobility and basic ADLs.  Discharge setting and therapy post discharge at home with home health is anticipated.  Patient has agreed to participate in the Acute Inpatient Rehabilitation Program and will admit today.   Preadmission Screen Completed By:  Cleatrice Burke, 01/29/2020 9:48 AM ______________________________________________________________________   Discussed status with Dr. Posey Pronto  on  01/29/2020 at [phone] and received approval for admission today.   Admission Coordinator:  Cleatrice Burke, RN, time 2831 Date  01/29/2020    Assessment/Plan: Diagnosis: Left femoral fx s/p IM nail   1. Does the need for close, 24 hr/day Medical supervision in concert with the patient's rehab needs make it unreasonable for this patient to be served in a less intensive setting? Yes  2. Co-Morbidities requiring supervision/potential complications: HTN (monitor and provide prns in accordance with increased physical exertion and pain), hyponatremia, ABLA (repeat labs, consider transfusion if necessary to ensure appropriate perfusion for increased activity tolerance) 3. Due to bowel management, safety, skin/wound care, disease management, pain management and patient education, does the patient require 24 hr/day rehab nursing? Yes 4. Does the  patient require coordinated care of a physician, rehab nurse, PT, OT to address physical and functional deficits in the context of the above medical diagnosis(es)? Yes Addressing deficits in the following areas: balance, endurance, locomotion, strength, transferring, bathing, dressing, toileting and psychosocial support 5. Can the patient actively participate in an intensive therapy program of at least 3 hrs of therapy 5 days a week? Yes 6. The potential for patient to make measurable gains while on inpatient rehab is excellent 7. Anticipated functional outcomes upon discharge from inpatient rehab: modified independent and supervision PT, modified independent and supervision OT, n/a SLP 8. Estimated rehab length of stay to reach the above functional goals is: 7-10 days. 9. Anticipated discharge destination: Home 10. Overall Rehab/Functional Prognosis: excellent  MD Signature: Delice Lesch, MD, ABPMR        Revision History                                         Note Details  Author Jamse Arn, MD File Time 01/29/2020 10:11 AM  Author Type Physician Status Signed  Last Editor Jamse Arn, MD Service Physical Medicine and Rehabilitation

## 2020-01-29 NOTE — Progress Notes (Signed)
Physical Therapy Treatment Patient Details Name: Kimberly Whitney MRN: [id number] DOB: [birth year] Today's Date: 01/29/2020    History of Present Illness Pt is a 69 y.o. F with significant PMH of hypertension, osteopenia, and L hip fracture 30 years ago who presents after a fall wtih a left femoral fracture now s/p open treatment with intramedullary implant.      PT Comments    Patient received in bed, pleasant and cooperative with therapies. Confirmed that she is leaving for CIR today, so session focus on functional exercises including ankle pumps, quad sets, heel slides, supine hip ABD, horizontal ABD with light orange TB and scapular retraction, shoulder flexion with scapular retraction and light orange TB, and tricep curls with light orange TB. Education provided regarding evaluation process and general progression/activities in CIR. Left in bed with all needs met, bed alarm active and spouse present.    Follow Up Recommendations  CIR     Equipment Recommendations  Rolling walker with 5" wheels;3in1 (PT)    Recommendations for Other Services       Precautions / Restrictions Precautions Precautions: Fall;Other (comment) Precaution Comments: Anxious with mobility Restrictions Weight Bearing Restrictions: Yes LLE Weight Bearing: Partial weight bearing LLE Partial Weight Bearing Percentage or Pounds: 25%    Mobility  Bed Mobility               General bed mobility comments: session focus on exercise  Transfers                 General transfer comment: session focus on exercise  Ambulation/Gait             General Gait Details: session focus on exercise   Stairs             Wheelchair Mobility    Modified Rankin (Stroke Patients Only)       Balance Overall balance assessment: Needs assistance Sitting-balance support: Feet supported;Bilateral upper extremity supported Sitting balance-Leahy Scale: Fair Sitting balance - Comments: reliant on  BUE support   Standing balance support: Bilateral upper extremity supported Standing balance-Leahy Scale: Poor Standing balance comment: reliant on BUE support                            Cognition Arousal/Alertness: Awake/alert Behavior During Therapy: WFL for tasks assessed/performed;Anxious Overall Cognitive Status: Within Functional Limits for tasks assessed                                        Exercises      General Comments        Pertinent Vitals/Pain Pain Assessment: Faces Faces Pain Scale: Hurts a little bit Pain Location: L hip Pain Descriptors / Indicators: Aching;Sore Pain Intervention(s): Limited activity within patient's tolerance;Monitored during session    Home Living                      Prior Function            PT Goals (current goals can now be found in the care plan section) Acute Rehab PT Goals Patient Stated Goal: less pain, return home PT Goal Formulation: With patient Time For Goal Achievement: 02/07/20 Potential to Achieve Goals: Good Progress towards PT goals: Progressing toward goals    Frequency    Min 5X/week      PT Plan Current plan remains  appropriate    Co-evaluation              AM-PAC PT "6 Clicks" Mobility   Outcome Measure  Help needed turning from your back to your side while in a flat bed without using bedrails?: A Little Help needed moving from lying on your back to sitting on the side of a flat bed without using bedrails?: A Lot Help needed moving to and from a bed to a chair (including a wheelchair)?: A Lot Help needed standing up from a chair using your arms (e.g., wheelchair or bedside chair)?: A Lot Help needed to walk in hospital room?: Total Help needed climbing 3-5 steps with a railing? : Total 6 Click Score: 11    End of Session   Activity Tolerance: Patient tolerated treatment well Patient left: in bed;with call bell/phone within reach;with bed alarm  set;with family/visitor present Nurse Communication: Mobility status PT Visit Diagnosis: Pain;Other abnormalities of gait and mobility (R26.89);Difficulty in walking, not elsewhere classified (R26.2) Pain - Right/Left: Left Pain - part of body: Hip     Time: 6773-7366 PT Time Calculation (min) (ACUTE ONLY): 26 min  Charges:  $Therapeutic Exercise: 8-22 mins $Self Care/Home Management: 8-22                     Windell Norfolk, DPT, PN1   Supplemental Physical Therapist [place]    Pager [phone] Acute Rehab Office [phone]

## 2020-01-29 NOTE — Progress Notes (Signed)
npatient Rehabilitation Medication Review by a Pharmacist  A complete drug regimen review was completed for this patient to identify any potential clinically significant medication issues.  Clinically significant medication issues were identified:  no  Check AMION for pharmacist assigned to patient if future medication questions/issues arise during this admission.  Time spent performing this drug regimen review (minutes):  15 minutes   Tad Moore 01/29/2020 5:59 PM

## 2020-01-29 NOTE — IPOC Note (Signed)
Individualized overall Plan of Care [facility]) Patient Details Name: Jensine Luz MRN: [id number] DOB: June 19, 1950  Admitting Diagnosis: Intertrochanteric fracture of left hip [facility])  Hospital Problems: Principal Problem:   Intertrochanteric fracture of left hip ([place]) Active Problems:   Hyponatremia   Transaminitis     Functional Problem List: Nursing Pain, Safety  PT Balance, Edema, Endurance, Motor, Pain, Safety  OT Balance, Perception, Safety, Cognition, Endurance, Motor, Pain, Edema  SLP    TR         Basic ADL's: OT Bathing, Dressing, Toileting     Advanced  ADL's: OT       Transfers: PT Bed Mobility, Bed to Chair, Car  OT Toilet, Tub/Shower     Locomotion: PT Ambulation, Stairs     Additional Impairments: OT Fuctional Use of Upper Extremity  SLP        TR      Anticipated Outcomes Item Anticipated Outcome  Self Feeding    Swallowing      Basic self-care  CGA to supervision  Toileting  CGA   Bathroom Transfers CGA  Bowel/Bladder  Patient will continue to be continent of bowel and bladder  Transfers  supervision with LRAD  Locomotion  supervision with LRAD  Communication     Cognition     Pain  Patient will have pain level at a 3 out of 10  Safety/Judgment  Patient will remain free of falls   Therapy Plan: PT Intensity: Minimum of 1-2 x/day ,45 to 90 minutes PT Frequency: 5 out of 7 days PT Duration Estimated Length of Stay: 2weeks OT Intensity: Minimum of 1-2 x/day, 45 to 90 minutes OT Frequency: 5 out of 7 days OT Duration/Estimated Length of Stay: 2 weeks      Team Interventions: Nursing Interventions Patient/Family Education, Pain Management  PT interventions Ambulation/gait training, Discharge planning, Functional mobility training, Psychosocial support, Therapeutic Activities, Visual/perceptual remediation/compensation, Wheelchair propulsion/positioning, Therapeutic Exercise, Skin care/wound management, Neuromuscular re-education,  Disease management/prevention, Training and development officer, Cognitive remediation/compensation, DME/adaptive equipment instruction, Pain management, Splinting/orthotics, UE/LE Strength taining/ROM, UE/LE Coordination activities, Stair training, Functional electrical stimulation, Patient/family education, Community reintegration  OT Interventions Discharge planning, Training and development officer, Pain management, Self Care/advanced ADL retraining, Therapeutic Activities, UE/LE Coordination activities, Disease mangement/prevention, Functional mobility training, Patient/family education, Skin care/wound managment, Therapeutic Exercise, DME/adaptive equipment instruction, Neuromuscular re-education, Cognitive remediation/compensation, UE/LE Strength taining/ROM, Psychosocial support, Wheelchair propulsion/positioning  SLP Interventions    TR Interventions    SW/CM Interventions Discharge Planning, Psychosocial Support, Patient/Family Education   Barriers to Discharge MD  Medical stability  Nursing      PT Home environment Child psychotherapist, Insurance for SNF coverage, Weight bearing restrictions 25% WB LLE  OT      SLP      SW       Team Discharge Planning: Destination: PT-Home ,OT- Home , SLP-  Projected Follow-up: PT-24 hour supervision/assistance, Home health PT, OT-  24 hour supervision/assistance, SLP-  Projected Equipment Needs: PT-To be determined, OT- To be determined, SLP-  Equipment Details: PT-Pt has a cane and ?w/c, OT-Pt reports she has a w/c that was her mothers she may be able to use Patient/family involved in discharge planning: PT- Patient,  OT-Patient, SLP-   MD ELOS: 10-14 days. Medical Rehab Prognosis:  Good Assessment: Everlena Mackley is a 69 year old right-handed female with history of hypertension, left hip fracture 30 years ago as well as tobacco use.  She presented on 01/23/20 after mechanical fall when she was in her kitchen getting onto a stool ladder and  missed a step.  No  LOC. Admission chemistry sodium 133, potassium 3.0, glucose 157, WBC 18,800, CK 127.  Patient sustained left comminuted intertrochanteric hip fracture with varus angulation.  Underwent open treatment  with intramedullary implant on 01/24/20 per Dr.Xu.  Patient is 25% partial weightbearing.   Acute blood loss anemia monitored.  Patient with resulting functional deficits with mobility, transfers, self-care.  Will set goals for Supervision with PT/OT.  Due to the current state of emergency, patients may not be receiving their 3-hours of Medicare-mandated therapy.  See Team Conference Notes for weekly updates to the plan of care

## 2020-01-29 NOTE — H&P (Signed)
Physical Medicine and Rehabilitation Admission H&P    Chief Complaint  Patient presents with  . Hip Injury  . Fall   HPI: Kimberly Whitney is a 69 year old right-handed female with history of hypertension, left hip fracture 30 years ago as well as tobacco use.  History taken from chart review and patient.  Patient lives with spouse.  Independent prior to admission.  1 level home 2 steps to entry.  She presented on 01/23/20 after mechanical fall when she was in her kitchen getting onto a stool ladder and missed a step.  No LOC. Admission chemistry sodium 133, potassium 3.0, glucose 157, WBC 18,800, CK 127.  Patient sustained left comminuted intertrochanteric hip fracture with varus angulation.  Underwent open treatment  with intramedullary implant on 01/24/20 per Dr.Xu.  Patient is 25% partial weightbearing.  Maintained on Lovenox for DVT prophylaxis.  Acute blood loss anemia with hemoglobin of 10.8 and monitored.  Therapy evaluations completed and patient was admitted for a comprehensive rehab program. Please see preadmission assessment from earlier today as well.   Review of Systems  Constitutional: Negative for chills and fever.  HENT: Negative for hearing loss.   Eyes: Negative for blurred vision and double vision.  Respiratory: Negative for cough and shortness of breath.   Cardiovascular: Negative for chest pain, palpitations and leg swelling.  Gastrointestinal: Positive for constipation. Negative for heartburn, nausea and vomiting.  Genitourinary: Negative for dysuria, flank pain and hematuria.  Musculoskeletal: Positive for joint pain and myalgias.  Skin: Negative for rash.  Neurological: Positive for focal weakness. Negative for sensory change and speech change.  All other systems reviewed and are negative.  Past Medical History:  Diagnosis Date  . Hypertension   . Osteopenia    Past Surgical History:  Procedure Laterality Date  . CHOLECYSTECTOMY    . FRACTURE SURGERY     L  hip 30 years ago  . INTRAMEDULLARY (IM) NAIL INTERTROCHANTERIC Left 01/23/2020   Procedure: INTRAMEDULLARY (IM) NAIL INTERTROCHANTRIC;  Surgeon: Leandrew Koyanagi, MD;  Location: [facility];  Service: Orthopedics;  Laterality: Left;   History reviewed. No pertinent family history of traumatic hip fracture Social History:  reports that she has quit smoking. She has never used smokeless tobacco. She reports that she does not drink alcohol. No history on file for drug use. Allergies:  Allergies  Allergen Reactions  . Lisinopril Cough  . Lidocaine Rash    Lidocaine patch   Medications Prior to Admission  Medication Sig Dispense Refill  . cetirizine (ZYRTEC ALLERGY) 10 MG tablet Take 10 mg by mouth at bedtime.    . chlorthalidone (HYGROTON) 50 MG tablet Take 50 mg by mouth daily.    Marland Kitchen losartan (COZAAR) 100 MG tablet Take 100 mg by mouth daily.    . magnesium 30 MG tablet Take 30 mg by mouth daily.    . metoprolol succinate (TOPROL-XL) 50 MG 24 hr tablet Take 50 mg by mouth every evening.       Drug Regimen Review Drug regimen was reviewed and remains appropriate with no significant issues identified  Home: Home Living Family/patient expects to be discharged to:: Private residence Living Arrangements: Spouse/significant other Available Help at Discharge: Family Type of Home: House Home Access: Stairs to enter Technical brewer of Steps: 1 Entrance Stairs-Rails: None Home Layout: One level Bathroom Shower/Tub: Multimedia programmer: Handicapped height Bathroom Accessibility: Yes Home Equipment: Wheelchair - manual, Shower seat - built in  Lives With: Spouse   Functional History:  Prior Function Level of Independence: Independent  Functional Status:  Mobility: Bed Mobility Overal bed mobility: Needs Assistance Bed Mobility: Sit to Supine Supine to sit: Mod assist, +2 for safety/equipment, HOB elevated Sit to supine: Mod assist, +2 for physical assistance General bed  mobility comments: to manage BLEs and trunk Transfers Overall transfer level: Needs assistance Equipment used: Rolling walker (2 wheeled) Transfers: Sit to/from Stand, Stand Pivot Transfers Sit to Stand: Min assist, +2 physical assistance Stand pivot transfers: Min assist, +2 physical assistance General transfer comment: MinAx2 for all functional transfers today with heavy coaching and cues for sequencing; did much better with RW lowered and had more UE muscle activation Ambulation/Gait General Gait Details: declned    ADL: ADL Overall ADL's : Needs assistance/impaired Eating/Feeding: Independent Grooming: Wash/dry hands, Wash/dry face, Sitting, Min guard Upper Body Bathing: Sitting, Minimal assistance Lower Body Bathing: Maximal assistance, Bed level Upper Body Dressing : Minimal assistance, Sitting Lower Body Dressing: Total assistance, Bed level Lower Body Dressing Details (indicate cue type and reason): to adjust socks Toilet Transfer: Moderate assistance, +2 for safety/equipment, +2 for physical assistance, BSC, Stand-pivot, RW Toilet Transfer Details (indicate cue type and reason): pt able to stand pivot from EOB>BSC>recliner with MOD- MAX A. MAX A +2 to power up into standing and MOD A +2 to pivot. pt requires step by step cues in relation to body mechanics Toileting- Clothing Manipulation and Hygiene: Total assistance, +2 for physical assistance, Sit to/from stand Toileting - Clothing Manipulation Details (indicate cue type and reason): anterior pericare in standing Functional mobility during ADLs: Moderate assistance, Maximal assistance, +2 for physical assistance, +2 for safety/equipment, Rolling walker General ADL Comments: pt able to progress OOB to recliner with MOD- MAX A +2 to pivot with RW. pt limited by pain and anxiety with movement but overall progressing from previous session  Cognition: Cognition Overall Cognitive Status: Within Functional Limits for tasks  assessed Orientation Level: Oriented X4 Cognition Arousal/Alertness: Awake/alert Behavior During Therapy: WFL for tasks assessed/performed, Anxious Overall Cognitive Status: Within Functional Limits for tasks assessed General Comments: very anxious with movement in general, very particular  Physical Exam: Blood pressure (!) 136/53, pulse 78, temperature 98.3 F (36.8 C), temperature source Oral, resp. rate 17, height 5' 5.5" (1.664 m), weight 82.6 kg, SpO2 98 %. Physical Exam Vitals reviewed.  Constitutional:      Appearance: Normal appearance.  HENT:     Head: Normocephalic and atraumatic.     Right Ear: External ear normal.     Left Ear: External ear normal.     Nose: Nose normal.  Eyes:     General:        Right eye: No discharge.        Left eye: No discharge.     Extraocular Movements: Extraocular movements intact.  Cardiovascular:     Rate and Rhythm: Normal rate and regular rhythm.  Pulmonary:     Effort: Pulmonary effort is normal.     Breath sounds: Normal breath sounds. No stridor.  Abdominal:     General: Abdomen is flat. Bowel sounds are normal. There is no distension.  Musculoskeletal:     Cervical back: Normal range of motion.     Comments: Left hip with edema and tenderness  Skin:    Comments: Incision with dressing CDI  Neurological:     Mental Status: She is alert.     Comments: Alert and oriented Motor: Bilateral upper extremities: 4+/5 proximal distal Right lower extremity: 4+/5 proximal distal Left  lower extremity: Hip flexion 2/5, knee extension 3/5, ankle dorsiflexion 4+/5 Sensation intact light touch  Psychiatric:        Mood and Affect: Mood normal.        Behavior: Behavior normal.        Thought Content: Thought content normal.     Results for orders placed or performed during the hospital encounter of 01/23/20 (from the past 48 hour(s))  CBC     Status: Abnormal   Collection Time: 01/27/20  2:22 PM  Result Value Ref Range   WBC 10.1  4.0 - 10.5 K/uL   RBC 3.41 (L) 3.87 - 5.11 MIL/uL   Hemoglobin 10.8 (L) 12.0 - 15.0 g/dL   HCT 32.3 (L) 36 - 46 %   MCV 94.7 80.0 - 100.0 fL   MCH 31.7 26.0 - 34.0 pg   MCHC 33.4 30.0 - 36.0 g/dL   RDW 12.7 11.5 - 15.5 %   Platelets 262 150 - 400 K/uL   nRBC 0.0 0.0 - 0.2 %    Comment: Performed at [facility], [place] [address] [place], [place] [address]  Creatinine, serum     Status: None   Collection Time: 01/27/20  2:22 PM  Result Value Ref Range   Creatinine, Ser 0.74 0.44 - 1.00 mg/dL   GFR calc non Af Amer >60 >60 mL/min   GFR calc Af Amer >60 >60 mL/min    Comment: Performed at [facility] [address] [place], [place] [address]   No results found.   Medical Problem List and Plan: 1.  Decreased functional mobility secondary to comminuted left intertrochanteric hip fracture with varus angulation.  Status post open treatment with intramedullary implant 01/24/2020.  25% partial weightbearing.  -patient may shower  -ELOS/Goals: 8-12 days/Supervision/Mod I  Admit to CIR 2.  Antithrombotics: -DVT/anticoagulation: Lovenox.    Vascular study ordered  -antiplatelet therapy: N/A 3. Pain Management: Oxycodone and Flexeril as needed  Monitor with increased exertion 4. Mood: Provide emotional support  -antipsychotic agents: N/A 5. Neuropsych: This patient is capable of making decisions on her own behalf. 6. Skin/Wound Care: Routine skin checks 7. Fluids/Electrolytes/Nutrition: Routine in and outs. CMP ordered for tomorrow. 8.  Acute blood loss anemia.    CBC ordered for tomorrow 9.  Hypertension.  Chlorthalidone 50 mg daily, Cozaar 100 mg daily, Toprol-XL 50 mg daily.    Monitor with increased mobility 11. Drug induced constipation.  Senokot S daily and MiraLAX daily  Adjust bowel meds as necessary  Cathlyn Parsons, PA-C 01/29/2020  I have personally performed a face to face diagnostic evaluation, including, but not limited to relevant history and physical  exam findings, of this patient and developed relevant assessment and plan.  Additionally, I have reviewed and concur with the physician assistant's documentation above.  Delice Lesch, MD, ABPMR

## 2020-01-29 NOTE — Discharge Summary (Signed)
Kimberly Whitney UDJ:[id number] DOB: [birth year] DOA: 01/23/2020   PCP: Pcp, No  Admit date: 01/23/2020  Discharge date: 01/29/2020  Admitted From: Home   disposition: SNF   Recommendations for Outpatient Follow-up:   Follow-up with orthopedics in 2 weeks for wound recheck and suture removal.  Home Health: N/A Equipment/Devices: Per SNF rehab Consultations: Orthopedics Discharge Condition: Improved CODE STATUS: Full Diet Recommendation: Heart Healthy   Diet Order            Diet - low sodium heart healthy           Diet Heart Room service appropriate? Yes; Fluid consistency: Thin  Diet effective now                  Chief Complaint  Patient presents with  . Hip Injury  . Fall     Brief history of present illness from the day of admission and additional interim summary    69 year old female with history of HTN was admitted with left hip fracture after mechanical fall. Patient was noted to have displaced intertrochanteric fracture of the left femur which was closed. Patient was seen by orthopedics who recommended admission to hospital service pending fixation in OR.                                                                  Hospital Course   Patient underwent IM intertrochanteric nail placement on 01/24/2020. She tolerated this well without any adverse effects. Patient has been seen and treated by PT/OT with good effect. She is now being discharged to SNF for ongoing rehab. Patient was noted to have leukocytosis without other evidence of infection which was thought to be stress margination. This resolved without any intervention. Patient's blood pressure has been well controlled in house and she has been maintained on her home medications which she is being discharged on without any change.  Patient is  being discharged on Lovenox 48 mg daily for 14 days for DVT prophylaxis and as needed oxycodone for pain management, both of which have been prescribed by and are being followed by orthopedics service.  Discharge diagnosis     Principal Problem:   Displaced intertrochanteric fracture of left femur, initial encounter for closed fracture [facility]) Active Problems:   Essential hypertension    Discharge instructions    Discharge Instructions    Call MD for:  extreme fatigue   Complete by: As directed    Call MD for:  redness, tenderness, or signs of infection (pain, swelling, redness, odor or green/yellow discharge around incision site)   Complete by: As directed    Call MD for:  severe uncontrolled pain   Complete by: As directed    Diet - low sodium heart healthy   Complete by: As directed  Discharge wound care:   Complete by: As directed    Per orthopedics instruction   Increase activity slowly   Complete by: As directed    Partial weight bearing   Complete by: As directed       Discharge Medications   Allergies as of 01/29/2020      Reactions   Lisinopril Cough   Lidocaine Rash   Lidocaine patch      Medication List    TAKE these medications   chlorthalidone 50 MG tablet Commonly known as: HYGROTON Take 50 mg by mouth daily.   enoxaparin 40 MG/0.4ML injection Commonly known as: LOVENOX Inject 0.4 mLs (40 mg total) into the skin daily for 14 days.   losartan 100 MG tablet Commonly known as: COZAAR Take 100 mg by mouth daily.   magnesium 30 MG tablet Take 30 mg by mouth daily.   metoprolol succinate 50 MG 24 hr tablet Commonly known as: TOPROL-XL Take 50 mg by mouth every evening.   oxyCODONE-acetaminophen 5-325 MG tablet Commonly known as: Percocet Take 1-2 tablets by mouth every 8 (eight) hours as needed for severe pain.   ZyrTEC Allergy 10 MG tablet Generic drug: cetirizine Take 10 mg by mouth at bedtime.            Discharge Care  Instructions  (From admission, onward)         Start     Ordered   01/29/20 0000  Discharge wound care:       Comments: Per orthopedics instruction   01/29/20 1150   01/24/20 0000  Partial weight bearing        01/24/20 0008           Follow-up Information    Leandrew Koyanagi, MD In 2 weeks.   Specialty: Orthopedic Surgery Why: For wound re-check, For suture removal Contact information: [place] [address] [phone]               Major procedures and Radiology Reports - PLEASE review detailed and final reports thoroughly  -      DG Chest 1 View  Result Date: 01/23/2020 CLINICAL DATA:  [place], hip fracture, preoperative evaluation EXAM: CHEST  1 VIEW COMPARISON:  08/29/2015 FINDINGS: The heart size and mediastinal contours are within normal limits. Both lungs are clear. The visualized skeletal structures are unremarkable. IMPRESSION: No active disease. Electronically Signed   By: Randa Ngo M.D.   On: 01/23/2020 16:56   DG C-Arm 1-60 Min  Result Date: 01/24/2020 CLINICAL DATA:  Known left hip fracture EXAM: DG HIP (WITH OR WITHOUT PELVIS) 2-3V LEFT; DG C-ARM 1-60 MIN COMPARISON:  Film from earlier in the same day. FLUOROSCOPY TIME:  Fluoroscopy Time:  1 minutes 50 seconds Radiation Exposure Index (if provided by the fluoroscopic device): 8.55 mGy Number of Acquired Spot Images: 8 FINDINGS: Initial images again demonstrate the proximal left femoral fracture. Fracture fragments have been nearly completely reduced. Medullary rod was then placed with 2 fixation screws proximally and 2 fixation screws distally. Fracture fragments are in near anatomic alignment. IMPRESSION: Status post ORIF of proximal left femoral fracture. Electronically Signed   By: Inez Catalina M.D.   On: 01/24/2020 00:55   DG HIP UNILAT WITH PELVIS 2-3 VIEWS LEFT  Result Date: 01/24/2020 CLINICAL DATA:  Known left hip fracture EXAM: DG HIP (WITH OR WITHOUT PELVIS) 2-3V LEFT; DG  C-ARM 1-60 MIN COMPARISON:  Film from earlier in the same day. FLUOROSCOPY TIME:  Fluoroscopy Time:  1 minutes 50 seconds Radiation Exposure Index (if provided by the fluoroscopic device): 8.55 mGy Number of Acquired Spot Images: 8 FINDINGS: Initial images again demonstrate the proximal left femoral fracture. Fracture fragments have been nearly completely reduced. Medullary rod was then placed with 2 fixation screws proximally and 2 fixation screws distally. Fracture fragments are in near anatomic alignment. IMPRESSION: Status post ORIF of proximal left femoral fracture. Electronically Signed   By: Inez Catalina M.D.   On: 01/24/2020 00:55   DG Hip Unilat With Pelvis 2-3 Views Left  Result Date: 01/23/2020 CLINICAL DATA:  [place] off stool EXAM: DG HIP (WITH OR WITHOUT PELVIS) 2-3V LEFT COMPARISON:  None. FINDINGS: Frontal view of the pelvis as well as frontal and frogleg lateral views of the left hip are obtained. There is a comminuted intertrochanteric left hip fracture with varus angulation at the fracture site. No dislocation. Remainder of the bony pelvis is unremarkable. The right hip is well aligned. IMPRESSION: 1. Comminuted inter trochanteric left hip fracture with varus angulation. Electronically Signed   By: Randa Ngo M.D.   On: 01/23/2020 16:51    Micro Results   Recent Results (from the past 240 hour(s))  Respiratory Panel by RT PCR (Flu A&B, Covid) - Nasopharyngeal Swab     Status: None   Collection Time: 01/23/20  3:51 PM   Specimen: Nasopharyngeal Swab  Result Value Ref Range Status   SARS Coronavirus 2 by RT PCR NEGATIVE NEGATIVE Final    Comment: (NOTE) SARS-CoV-2 target nucleic acids are NOT DETECTED.  The SARS-CoV-2 RNA is generally detectable in upper respiratoy specimens during the acute phase of infection. The lowest concentration of SARS-CoV-2 viral copies this assay can detect is 131 copies/mL. A negative result does not preclude SARS-Cov-2 infection and should not be  used as the sole basis for treatment or other patient management decisions. A negative result may occur with  improper specimen collection/handling, submission of specimen other than nasopharyngeal swab, presence of viral mutation(s) within the areas targeted by this assay, and inadequate number of viral copies (<131 copies/mL). A negative result must be combined with clinical observations, patient history, and epidemiological information. The expected result is Negative.  Fact Sheet for Patients:  PinkCheek.be  Fact Sheet for Healthcare Providers:  GravelBags.it  This test is no t yet approved or cleared by the [place] FDA and  has been authorized for detection and/or diagnosis of SARS-CoV-2 by FDA under an Emergency Use Authorization (EUA). This EUA will remain  in effect (meaning this test can be used) for the duration of the COVID-19 declaration under Section 564(b)(1) of the Act, 21 U.S.C. section 360bbb-3(b)(1), unless the authorization is terminated or revoked sooner.     Influenza A by PCR NEGATIVE NEGATIVE Final   Influenza B by PCR NEGATIVE NEGATIVE Final    Comment: (NOTE) The Xpert Xpress SARS-CoV-2/FLU/RSV assay is intended as an aid in  the diagnosis of influenza from Nasopharyngeal swab specimens and  should not be used as a sole basis for treatment. Nasal washings and  aspirates are unacceptable for Xpert Xpress SARS-CoV-2/FLU/RSV  testing.  Fact Sheet for Patients: PinkCheek.be  Fact Sheet for Healthcare Providers: GravelBags.it  This test is not yet approved or cleared by the [place] FDA and  has been authorized for detection and/or diagnosis of SARS-CoV-2 by  FDA under an Emergency Use Authorization (EUA). This EUA will remain  in effect (meaning this test can be used) for the duration of the  Covid-19 declaration  under Section  564(b)(1) of the Act, 21  U.S.C. section 360bbb-3(b)(1), unless the authorization is  terminated or revoked. Performed at [facility], [place] [address] [place], [place] [address]     Today   Subjective    Karlen Barbar feels much improved since admission. Is happy to go to rehab. Denies chest pain, shortness of breath or abdominal pain.     Objective   Blood pressure (!) 123/54, pulse 74, temperature 97.8 F (36.6 C), temperature source Oral, resp. rate 17, height 5' 5.5" (1.664 m), weight 82.6 kg, SpO2 97 %.   Intake/Output Summary (Last 24 hours) at 01/29/2020 1151 Last data filed at 01/28/2020 1330 Gross per 24 hour  Intake 120 ml  Output --  Net 120 ml    Exam General: Patient appears well and in good spirits sitting up in bed in no acute distress.  Eyes: sclera anicteric, conjuctiva mild injection bilaterally CVS: S1-S2, regular  Respiratory:  decreased air entry bilaterally secondary to decreased inspiratory effort, rales at bases  GI: NABS, soft, NT  LE: No edema. CDI Neuro: A/O x 3, grossly nonfocal.  Psych: patient is logical and coherent, judgement and insight appear normal, mood and affect appropriate to situation.    Data Review   CBC w Diff:  Lab Results  Component Value Date   WBC 10.1 01/27/2020   HGB 10.8 (L) 01/27/2020   HCT 32.3 (L) 01/27/2020   PLT 262 01/27/2020   LYMPHOPCT 8 01/23/2020   MONOPCT 6 01/23/2020   EOSPCT 0 01/23/2020   BASOPCT 0 01/23/2020    CMP:  Lab Results  Component Value Date   NA 133 (L) 01/26/2020   K 3.5 01/26/2020   CL 97 (L) 01/26/2020   CO2 28 01/26/2020   BUN 11 01/26/2020   CREATININE 0.74 01/27/2020   PROT 5.9 (L) 01/24/2020   ALBUMIN 3.4 (L) 01/24/2020   BILITOT 0.7 01/24/2020   ALKPHOS 52 01/24/2020   AST 41 01/24/2020   ALT 38 01/24/2020  .   Total Time in preparing paper work, data evaluation and todays exam - 35 minutes  Vashti Hey M.D on 01/29/2020 at 11:51 AM  [facility]   Office  [phone]

## 2020-01-29 NOTE — Plan of Care (Signed)
?  Problem: Education: ?Goal: Knowledge of General Education information will improve ?Description: Including pain rating scale, medication(s)/side effects and non-pharmacologic comfort measures ?Outcome: Progressing ?  ?Problem: Health Behavior/Discharge Planning: ?Goal: Ability to manage health-related needs will improve ?Outcome: Progressing ?  ?Problem: Coping: ?Goal: Level of anxiety will decrease ?Outcome: Progressing ?  ?

## 2020-01-29 NOTE — Telephone Encounter (Signed)
Forms received [employer] for pts husband. Forms sent to [employer]. He is aware of $25.00 and that patient needs to sign [place]

## 2020-01-29 NOTE — H&P (Signed)
Physical Medicine and Rehabilitation Admission H&P    Chief Complaint  Patient presents with  . Hip Injury  . Fall   HPI: Kimberly Whitney is a 69 year old right-handed female with history of hypertension, left hip fracture 30 years ago as well as tobacco use.  History taken from chart review and patient.  Patient lives with spouse.  Independent prior to admission.  1 level home 2 steps to entry.  She presented on 01/23/20 after mechanical fall when she was in her kitchen getting onto a stool ladder and missed a step.  No LOC. Admission chemistry sodium 133, potassium 3.0, glucose 157, WBC 18,800, CK 127.  Patient sustained left comminuted intertrochanteric hip fracture with varus angulation.  Underwent open treatment  with intramedullary implant on 01/24/20 per Dr.Xu.  Patient is 25% partial weightbearing.  Maintained on Lovenox for DVT prophylaxis.  Acute blood loss anemia with hemoglobin of 10.8 and monitored.  Therapy evaluations completed and patient was admitted for a comprehensive rehab program. Please see preadmission assessment from earlier today as well.   Review of Systems  Constitutional: Negative for chills and fever.  HENT: Negative for hearing loss.   Eyes: Negative for blurred vision and double vision.  Respiratory: Negative for cough and shortness of breath.   Cardiovascular: Negative for chest pain, palpitations and leg swelling.  Gastrointestinal: Positive for constipation. Negative for heartburn, nausea and vomiting.  Genitourinary: Negative for dysuria, flank pain and hematuria.  Musculoskeletal: Positive for joint pain and myalgias.  Skin: Negative for rash.  Neurological: Positive for focal weakness. Negative for sensory change and speech change.  All other systems reviewed and are negative.  Past Medical History:  Diagnosis Date  . Hypertension   . Osteopenia    Past Surgical History:  Procedure Laterality Date  . CHOLECYSTECTOMY    . FRACTURE SURGERY     L  hip 30 years ago  . INTRAMEDULLARY (IM) NAIL INTERTROCHANTERIC Left 01/23/2020   Procedure: INTRAMEDULLARY (IM) NAIL INTERTROCHANTRIC;  Surgeon: Leandrew Koyanagi, MD;  Location: [place];  Service: Orthopedics;  Laterality: Left;   History reviewed. No pertinent family history of traumatic hip fracture Social History:  reports that she has quit smoking. She has never used smokeless tobacco. She reports that she does not drink alcohol. No history on file for drug use. Allergies:  Allergies  Allergen Reactions  . Lisinopril Cough  . Lidocaine Rash    Lidocaine patch   Medications Prior to Admission  Medication Sig Dispense Refill  . cetirizine (ZYRTEC ALLERGY) 10 MG tablet Take 10 mg by mouth at bedtime.    . chlorthalidone (HYGROTON) 50 MG tablet Take 50 mg by mouth daily.    Marland Kitchen losartan (COZAAR) 100 MG tablet Take 100 mg by mouth daily.    . magnesium 30 MG tablet Take 30 mg by mouth daily.    . metoprolol succinate (TOPROL-XL) 50 MG 24 hr tablet Take 50 mg by mouth every evening.       Drug Regimen Review Drug regimen was reviewed and remains appropriate with no significant issues identified  Home: Home Living Family/patient expects to be discharged to:: Private residence Living Arrangements: Spouse/significant other Available Help at Discharge: Family Type of Home: House Home Access: Stairs to enter Technical brewer of Steps: 1 Entrance Stairs-Rails: None Home Layout: One level Bathroom Shower/Tub: Multimedia programmer: Handicapped height Bathroom Accessibility: Yes Home Equipment: Wheelchair - manual, Shower seat - built in  Lives With: Spouse   Functional History:  Prior Function Level of Independence: Independent  Functional Status:  Mobility: Bed Mobility Overal bed mobility: Needs Assistance Bed Mobility: Sit to Supine Supine to sit: Mod assist, +2 for safety/equipment, HOB elevated Sit to supine: Mod assist, +2 for physical assistance General bed  mobility comments: to manage BLEs and trunk Transfers Overall transfer level: Needs assistance Equipment used: Rolling walker (2 wheeled) Transfers: Sit to/from Stand, Stand Pivot Transfers Sit to Stand: Min assist, +2 physical assistance Stand pivot transfers: Min assist, +2 physical assistance General transfer comment: MinAx2 for all functional transfers today with heavy coaching and cues for sequencing; did much better with RW lowered and had more UE muscle activation Ambulation/Gait General Gait Details: declned    ADL: ADL Overall ADL's : Needs assistance/impaired Eating/Feeding: Independent Grooming: Wash/dry hands, Wash/dry face, Sitting, Min guard Upper Body Bathing: Sitting, Minimal assistance Lower Body Bathing: Maximal assistance, Bed level Upper Body Dressing : Minimal assistance, Sitting Lower Body Dressing: Total assistance, Bed level Lower Body Dressing Details (indicate cue type and reason): to adjust socks Toilet Transfer: Moderate assistance, +2 for safety/equipment, +2 for physical assistance, BSC, Stand-pivot, RW Toilet Transfer Details (indicate cue type and reason): pt able to stand pivot from EOB>BSC>recliner with MOD- MAX A. MAX A +2 to power up into standing and MOD A +2 to pivot. pt requires step by step cues in relation to body mechanics Toileting- Clothing Manipulation and Hygiene: Total assistance, +2 for physical assistance, Sit to/from stand Toileting - Clothing Manipulation Details (indicate cue type and reason): anterior pericare in standing Functional mobility during ADLs: Moderate assistance, Maximal assistance, +2 for physical assistance, +2 for safety/equipment, Rolling walker General ADL Comments: pt able to progress OOB to recliner with MOD- MAX A +2 to pivot with RW. pt limited by pain and anxiety with movement but overall progressing from previous session  Cognition: Cognition Overall Cognitive Status: Within Functional Limits for tasks  assessed Orientation Level: Oriented X4 Cognition Arousal/Alertness: Awake/alert Behavior During Therapy: WFL for tasks assessed/performed, Anxious Overall Cognitive Status: Within Functional Limits for tasks assessed General Comments: very anxious with movement in general, very particular  Physical Exam: Blood pressure (!) 136/53, pulse 78, temperature 98.3 F (36.8 C), temperature source Oral, resp. rate 17, height 5' 5.5" (1.664 m), weight 82.6 kg, SpO2 98 %. Physical Exam Vitals reviewed.  Constitutional:      Appearance: Normal appearance.  HENT:     Head: Normocephalic and atraumatic.     Right Ear: External ear normal.     Left Ear: External ear normal.     Nose: Nose normal.  Eyes:     General:        Right eye: No discharge.        Left eye: No discharge.     Extraocular Movements: Extraocular movements intact.  Cardiovascular:     Rate and Rhythm: Normal rate and regular rhythm.  Pulmonary:     Effort: Pulmonary effort is normal.     Breath sounds: Normal breath sounds. No stridor.  Abdominal:     General: Abdomen is flat. Bowel sounds are normal. There is no distension.  Musculoskeletal:     Cervical back: Normal range of motion.     Comments: Left hip with edema and tenderness  Skin:    Comments: Incision with dressing CDI  Neurological:     Mental Status: She is alert.     Comments: Alert and oriented Motor: Bilateral upper extremities: 4+/5 proximal distal Right lower extremity: 4+/5 proximal distal Left  lower extremity: Hip flexion 2/5, knee extension 3/5, ankle dorsiflexion 4+/5 Sensation intact light touch  Psychiatric:        Mood and Affect: Mood normal.        Behavior: Behavior normal.        Thought Content: Thought content normal.     Results for orders placed or performed during the hospital encounter of 01/23/20 (from the past 48 hour(s))  CBC     Status: Abnormal   Collection Time: 01/27/20  2:22 PM  Result Value Ref Range   WBC 10.1  4.0 - 10.5 K/uL   RBC 3.41 (L) 3.87 - 5.11 MIL/uL   Hemoglobin 10.8 (L) 12.0 - 15.0 g/dL   HCT 32.3 (L) 36 - 46 %   MCV 94.7 80.0 - 100.0 fL   MCH 31.7 26.0 - 34.0 pg   MCHC 33.4 30.0 - 36.0 g/dL   RDW 12.7 11.5 - 15.5 %   Platelets 262 150 - 400 K/uL   nRBC 0.0 0.0 - 0.2 %    Comment: Performed at [facility], [place] [address], [place], [place] [address]  Creatinine, serum     Status: None   Collection Time: 01/27/20  2:22 PM  Result Value Ref Range   Creatinine, Ser 0.74 0.44 - 1.00 mg/dL   GFR calc non Af Amer >60 >60 mL/min   GFR calc Af Amer >60 >60 mL/min    Comment: Performed at [facility] [address] [place], [place] [address]   No results found.   Medical Problem List and Plan: 1.  Decreased functional mobility secondary to comminuted left intertrochanteric hip fracture with varus angulation.  Status post open treatment with intramedullary implant 01/24/2020.  25% partial weightbearing.  -patient may shower  -ELOS/Goals: 8-12 days/Supervision/Mod I  Admit to CIR 2.  Antithrombotics: -DVT/anticoagulation: Lovenox.    Vascular study ordered  -antiplatelet therapy: N/A 3. Pain Management: Oxycodone and Flexeril as needed  Monitor with increased exertion 4. Mood: Provide emotional support  -antipsychotic agents: N/A 5. Neuropsych: This patient is capable of making decisions on her own behalf. 6. Skin/Wound Care: Routine skin checks 7. Fluids/Electrolytes/Nutrition: Routine in and outs. CMP ordered for tomorrow. 8.  Acute blood loss anemia.    CBC ordered for tomorrow 9.  Hypertension.  Chlorthalidone 50 mg daily, Cozaar 100 mg daily, Toprol-XL 50 mg daily.    Monitor with increased mobility 11. Drug induced constipation.  Senokot S daily and MiraLAX daily  Adjust bowel meds as necessary  Cathlyn Parsons, PA-C 01/29/2020  I have personally performed a face to face diagnostic evaluation, including, but not limited to relevant history and physical  exam findings, of this patient and developed relevant assessment and plan.  Additionally, I have reviewed and concur with the physician assistant's documentation above.  Delice Lesch, MD, ABPMR  The patient's status has not changed. The original post admission physician evaluation remains appropriate, and any changes from the pre-admission screening or documentation from the acute chart are noted above.   Delice Lesch, MD, ABPMR

## 2020-01-29 NOTE — Progress Notes (Signed)
Report given to CIR nurse, all questions answered.

## 2020-01-29 NOTE — Progress Notes (Signed)
Patient arrived to the unit with spouse. Oriented to unit routine, verbalized understanding of need to call for assistance. 3 incision sites noted, not other skin issues. No complaints of pain at this time, resting comfortably with call bell in place.

## 2020-01-30 ENCOUNTER — Inpatient Hospital Stay (HOSPITAL_COMMUNITY): Payer: BC Managed Care – PPO | Admitting: Occupational Therapy

## 2020-01-30 ENCOUNTER — Inpatient Hospital Stay (HOSPITAL_COMMUNITY): Payer: BC Managed Care – PPO

## 2020-01-30 DIAGNOSIS — I10 Essential (primary) hypertension: Secondary | ICD-10-CM

## 2020-01-30 DIAGNOSIS — R7401 Elevation of levels of liver transaminase levels: Secondary | ICD-10-CM

## 2020-01-30 DIAGNOSIS — D62 Acute posthemorrhagic anemia: Secondary | ICD-10-CM

## 2020-01-30 DIAGNOSIS — K5903 Drug induced constipation: Secondary | ICD-10-CM

## 2020-01-30 DIAGNOSIS — M7989 Other specified soft tissue disorders: Secondary | ICD-10-CM

## 2020-01-30 DIAGNOSIS — G8918 Other acute postprocedural pain: Secondary | ICD-10-CM

## 2020-01-30 DIAGNOSIS — E871 Hypo-osmolality and hyponatremia: Secondary | ICD-10-CM

## 2020-01-30 DIAGNOSIS — S72145D Nondisplaced intertrochanteric fracture of left femur, subsequent encounter for closed fracture with routine healing: Secondary | ICD-10-CM

## 2020-01-30 LAB — CBC WITH DIFFERENTIAL/PLATELET
Abs Immature Granulocytes: 0.07 10*3/uL (ref 0.00–0.07)
Basophils Absolute: 0.1 10*3/uL (ref 0.0–0.1)
Basophils Relative: 1 %
Eosinophils Absolute: 0.7 10*3/uL — ABNORMAL HIGH (ref 0.0–0.5)
Eosinophils Relative: 9 %
HCT: 32.9 % — ABNORMAL LOW (ref 36.0–46.0)
Hemoglobin: 10.7 g/dL — ABNORMAL LOW (ref 12.0–15.0)
Immature Granulocytes: 1 %
Lymphocytes Relative: 14 %
Lymphs Abs: 1.1 10*3/uL (ref 0.7–4.0)
MCH: 30.8 pg (ref 26.0–34.0)
MCHC: 32.5 g/dL (ref 30.0–36.0)
MCV: 94.8 fL (ref 80.0–100.0)
Monocytes Absolute: 0.8 10*3/uL (ref 0.1–1.0)
Monocytes Relative: 10 %
Neutro Abs: 5.3 10*3/uL (ref 1.7–7.7)
Neutrophils Relative %: 65 %
Platelets: 346 10*3/uL (ref 150–400)
RBC: 3.47 MIL/uL — ABNORMAL LOW (ref 3.87–5.11)
RDW: 13.1 % (ref 11.5–15.5)
WBC: 8 10*3/uL (ref 4.0–10.5)
nRBC: 0 % (ref 0.0–0.2)

## 2020-01-30 LAB — COMPREHENSIVE METABOLIC PANEL
ALT: 49 U/L — ABNORMAL HIGH (ref 0–44)
AST: 34 U/L (ref 15–41)
Albumin: 3.3 g/dL — ABNORMAL LOW (ref 3.5–5.0)
Alkaline Phosphatase: 110 U/L (ref 38–126)
Anion gap: 8 (ref 5–15)
BUN: 16 mg/dL (ref 8–23)
CO2: 27 mmol/L (ref 22–32)
Calcium: 9.3 mg/dL (ref 8.9–10.3)
Chloride: 99 mmol/L (ref 98–111)
Creatinine, Ser: 0.79 mg/dL (ref 0.44–1.00)
GFR calc Af Amer: >60 mL/min (ref >60)
GFR calc non Af Amer: >60 mL/min (ref >60)
Glucose, Bld: 117 mg/dL — ABNORMAL HIGH (ref 70–99)
Potassium: 3.7 mmol/L (ref 3.5–5.1)
Sodium: 134 mmol/L — ABNORMAL LOW (ref 135–145)
Total Bilirubin: 1.4 mg/dL — ABNORMAL HIGH (ref 0.3–1.2)
Total Protein: 6.2 g/dL — ABNORMAL LOW (ref 6.5–8.1)

## 2020-01-30 NOTE — Progress Notes (Signed)
Inpatient Rehabilitation  Patient information reviewed and entered into eRehab system by Rodger Giangregorio M. Ikran Patman, M.A., CCC/SLP, PPS Coordinator.  Information including medical coding, functional ability and quality indicators will be reviewed and updated through discharge.    

## 2020-01-30 NOTE — Progress Notes (Signed)
Patient Details  Name: Kimberly Whitney MRN: [id number] Date of Birth: [birth year]  Today's Date: 01/30/2020  Hospital Problems: Principal Problem:   Intertrochanteric fracture of left hip [facility])  Past Medical History:  Past Medical History:  Diagnosis Date  . Hypertension   . Osteopenia    Past Surgical History:  Past Surgical History:  Procedure Laterality Date  . CHOLECYSTECTOMY    . FRACTURE SURGERY     L hip 30 years ago  . INTRAMEDULLARY (IM) NAIL INTERTROCHANTERIC Left 01/23/2020   Procedure: INTRAMEDULLARY (IM) NAIL INTERTROCHANTRIC;  Surgeon: Leandrew Koyanagi, MD;  Location: [place];  Service: Orthopedics;  Laterality: Left;   Social History:  reports that she has quit smoking. She has never used smokeless tobacco. She reports that she does not drink alcohol. No history on file for drug use.  Family / Support Systems Marital Status: Married Patient Roles: Spouse, Parent, Caregiver (was helping care for grandchildren 47 & 63 yo) Spouse/Significant Other: Yvone Neu [phone] Children: Brooke-daughter [phone] RN here Other Supports: Friends Anticipated Caregiver: Husband and daughter Ability/Limitations of Caregiver: Husband to take time off from work to be there with her Caregiver Availability: 24/7 Family Dynamics: Close knit family who husband plans to take time off work and retire 05/2020. Daughter is a PACU RN here and is involved. Pt was assisting with the care of her grandchildren prior to this fall  Social History Preferred language: English Religion: Christian Cultural Background: No issues Education: HS Read: Yes Write: Yes Employment Status: Retired Public relations account executive Issues: No issues Guardian/Conservator: None-according to MD pt is capable of making her own decisions while here   Abuse/Neglect Abuse/Neglect Assessment Can Be Completed: Yes Physical Abuse: Denies Verbal Abuse: Denies Sexual Abuse: Denies Exploitation of patient/patient's  resources: Denies Self-Neglect: Denies  Emotional Status Pt's affect, behavior and adjustment status: Pt is motivated to do well but is havng nausea with pain meds. She has always been independent and taken care of the home and others. She fractured her hip 30 years ago and now this Recent Psychosocial Issues: healthy prior to admission-fractured hip 30 years ago and this is the same hip Psychiatric History: No history deferred depression screen due to coping appropriately and able to verbalize concerns Substance Abuse History: Tobacco unsure if plans on quitting. She is aware of the resources and health issues  Patient / Family Perceptions, Expectations & Goals Pt/Family understanding of illness & functional limitations: Pt and husband can explain her fracture and treatment plan going forward. She talks with the MD daily and feels she has a good understanding of her medical issues Premorbid pt/family roles/activities: Wife, Mother, gradnmother, church Designer, television/film set, friend, etc Anticipated changes in roles/activities/participation: resume Pt/family expectations/goals: Pt states: " I want to be able to do as much as I can for myself."  Husband states: " I wil help she will do well here."  [employer]: None Premorbid Home Care/DME Agencies: Other (Comment) (has Mom's wheelchair, tub seat and elevated toliets at home) Transportation available at discharge: Husband or daughter will provide this  Discharge Planning Living Arrangements: Spouse/significant other Support Systems: Spouse/significant other, Children, Friends/neighbors, Church/faith community Type of Residence: Private residence Insurance Resources: [employer], Multimedia programmer (specify) Nurse, mental health) Financial Resources: Fish farm manager, Family Support Financial Screen Referred: No Living Expenses: Own Money Management: Patient, Spouse Does the patient have any problems obtaining your medications?: No Home Management:  Self Patient/Family Preliminary Plans: Return home with husband taking time off of work to assist her. Their daughter will assist  when not working. Will await team evaluations and work on discharge needs. Care Coordinator Anticipated Follow Up Needs: HH/OP  Clinical Impression Pleasant but nauseous but trying to participate in therapies this am. Her husband and daughter are involved and supportive. Will await therapy eval's and work on plan. Pt appears to be coping appropriately. No need for neuro-psych services.  Elease Hashimoto 01/30/2020, 9:27 AM

## 2020-01-30 NOTE — Evaluation (Signed)
Occupational Therapy Assessment and Plan  Patient Details  Name: Blaine Guiffre MRN: [id number] Date of Birth: 18-Nov-1950  OT Diagnosis: acute pain, muscle weakness (generalized) and pain in joint Rehab Potential: Rehab Potential (ACUTE ONLY): Good ELOS: 2 weeks   Today's Date: 01/30/2020 OT Individual Time: first session: [phone];second session: 1515-1530 OT Individual Time Calculation (min): First session: 70 min; second session: 15 min     Hospital Problem: Principal Problem:   Intertrochanteric fracture of left hip [facility])   Past Medical History:  Past Medical History:  Diagnosis Date  . Hypertension   . Osteopenia    Past Surgical History:  Past Surgical History:  Procedure Laterality Date  . CHOLECYSTECTOMY    . FRACTURE SURGERY     L hip 30 years ago  . INTRAMEDULLARY (IM) NAIL INTERTROCHANTERIC Left 01/23/2020   Procedure: INTRAMEDULLARY (IM) NAIL INTERTROCHANTRIC;  Surgeon: Leandrew Koyanagi, MD;  Location: [facility];  Service: Orthopedics;  Laterality: Left;    Assessment & Plan Clinical Impression: Zamiah Tollett is a 69 year old right-handed female with history of hypertension, left hip fracture 30 years ago as well as tobacco use.  History taken from chart review and patient.  Patient lives with spouse.  Independent prior to admission.  1 level home 2 steps to entry.  She presented on 01/23/20 after mechanical fall when she was in her kitchen getting onto a stool ladder and missed a step.  No LOC. Admission chemistry sodium 133, potassium 3.0, glucose 157, WBC 18,800, CK 127.  Patient sustained left comminuted intertrochanteric hip fracture with varus angulation.  Underwent open treatment  with intramedullary implant on 01/24/20 per Dr.Xu.  Patient is 25% partial weightbearing.  Maintained on Lovenox for DVT prophylaxis.  Acute blood loss anemia with hemoglobin of 10.8 and monitored  Patient transferred to CIR on 01/29/2020 .    Patient currently requires mod with basic self-care  skills secondary to muscle weakness, decreased cardiorespiratoy endurance, decreased coordination and decreased sitting balance, decreased standing balance and decreased balance strategies.  Prior to hospitalization, patient could complete ADLs and IADLs with independent .  Patient will benefit from skilled intervention to increase independence with basic self-care skills prior to discharge home with care partner.  Anticipate patient will require 24 hour supervision and minimal physical assistance and follow up home health.  OT - End of Session Activity Tolerance: Tolerates 30+ min activity with multiple rests Endurance Deficit: Yes Endurance Deficit Description: Pain contributing. Required rest breaks intermittently throughout self care due to dizziness and nausea OT Assessment Rehab Potential (ACUTE ONLY): Good OT Patient demonstrates impairments in the following area(s): Balance;Perception;Safety;Cognition;Endurance;Motor;Pain;Edema OT Basic ADL's Functional Problem(s): Bathing;Dressing;Toileting OT Transfers Functional Problem(s): Toilet;Tub/Shower OT Additional Impairment(s): Fuctional Use of Upper Extremity OT Plan OT Intensity: Minimum of 1-2 x/day, 45 to 90 minutes OT Frequency: 5 out of 7 days OT Duration/Estimated Length of Stay: 2 weeks OT Treatment/Interventions: Discharge planning;Balance/vestibular training;Pain management;Self Care/advanced ADL retraining;Therapeutic Activities;UE/LE Coordination activities;Disease mangement/prevention;Functional mobility training;Patient/family education;Skin care/wound managment;Therapeutic Exercise;DME/adaptive equipment instruction;Neuromuscular re-education;Cognitive remediation/compensation;UE/LE Strength taining/ROM;Psychosocial support;Wheelchair propulsion/positioning OT Basic Self-Care Anticipated Outcome(s): CGA to supervision OT Toileting Anticipated Outcome(s): CGA OT Bathroom Transfers Anticipated Outcome(s): CGA OT  Recommendation Recommendations for Other Services: Therapeutic Recreation consult Therapeutic Recreation Interventions: Other (comment);Stress management (leisure) Patient destination: Home Follow Up Recommendations: 24 hour supervision/assistance Equipment Recommended: To be determined Equipment Details: Pt reports she has a w/c that was her mothers she may be able to use   OT Evaluation Precautions/Restrictions  Precautions Precautions: Fall Precaution Comments: Anxious with  mobility Restrictions Weight Bearing Restrictions: Yes LLE Weight Bearing: Partial weight bearing LLE Partial Weight Bearing Percentage or Pounds: 25% LLE General Chart Reviewed: Yes Pain Pain Assessment Pain Scale: 0-10 Pain Score: 3  Pain Type: Surgical pain Pain Location: Hip Pain Orientation: Left Pain Descriptors / Indicators: Aching Pain Onset: With Activity Pain Intervention(s): Repositioned;Rest Multiple Pain Sites: No Home Living/Prior Functioning Home Living Family/patient expects to be discharged to:: Private residence Living Arrangements: Spouse/significant other Available Help at Discharge: Family, Available 24 hours/day, Other (Comment) Type of Home: House Home Access: Stairs to enter [employer] of Steps: Front: 2 with no rail. Garage: 2 with no rail. Back: 12-15 steps Entrance Stairs-Rails: None Home Layout: One level Bathroom Shower/Tub: Multimedia programmer: Handicapped height Bathroom Accessibility: Yes  Lives With: Spouse Prior Function Level of Independence: Independent with gait, Other (comment), Independent with homemaking with ambulation  Able to Take Stairs?: Yes Driving: Yes Vocation: Retired Leisure: Hobbies-yes (Comment) Comments: Power walking on treadmill for 20-25 minutes, daily; yardwork Vision Baseline Vision/History: Wears glasses Wears Glasses: At all times Patient Visual Report: No change from baseline Vision Assessment?: No  apparent visual deficits Perception  Perception: Within Functional Limits Praxis Praxis: Intact Cognition Overall Cognitive Status: Within Functional Limits for tasks assessed Arousal/Alertness: Awake/alert Orientation Level: Person;Place;Situation Person: Oriented Place: Oriented Situation: Oriented Year: 2021 Month: September Day of Week: Correct Memory: Appears intact Immediate Memory Recall: Sock;Blue;Bed Memory Recall Sock: Without Cue Memory Recall Blue: Without Cue Memory Recall Bed: Without Cue Attention: Focused;Sustained Focused Attention: Appears intact Sustained Attention: Appears intact Awareness: Appears intact Problem Solving: Impaired Problem Solving Impairment: Functional basic Safety/Judgment: Impaired Sensation Sensation Light Touch: Appears Intact Hot/Cold: Appears Intact Proprioception: Appears Intact Coordination Gross Motor Movements are Fluid and Coordinated: No Fine Motor Movements are Fluid and Coordinated: Yes Coordination and Movement Description: LLE pain limiting, anxious with mobility Motor  Motor Motor: Abnormal postural alignment and control;Other (comment) Motor - Skilled Clinical Observations: pain limiting with L hip fx and s/p LMN. Effortful movement  Trunk/Postural Assessment  Cervical Assessment Cervical Assessment: Exceptions to [facility] (mild forward head posture) Thoracic Assessment Thoracic Assessment: Exceptions to [facility] (mild kyphosis) Lumbar Assessment Lumbar Assessment: Exceptions to [facility] (mild posterior pelvic tilt) Postural Control Postural Control: Within Functional Limits  Balance Balance Balance Assessed: Yes Static Sitting Balance Static Sitting - Balance Support: Feet supported;Left upper extremity supported Static Sitting - Level of Assistance: 5: Stand by assistance Dynamic Sitting Balance Sitting balance - Comments: Reliant on LUE support to offload L hip for pain management Static Standing Balance Static  Standing - Balance Support: Bilateral upper extremity supported Static Standing - Level of Assistance: 4: Min assist Dynamic Standing Balance Dynamic Standing - Balance Support: During functional activity;Bilateral upper extremity supported Dynamic Standing - Level of Assistance: 3: Mod assist Extremity/Trunk Assessment RUE Assessment RUE Assessment: Within Functional Limits General Strength Comments: Grossly 5/5 LUE Assessment LUE Assessment: Within Functional Limits General Strength Comments: Grossly 5/5  Care Tool Care Tool Self Care Eating   Eating Assist Level: Independent    Oral Care    Oral Care Assist Level: Set up assist    Bathing   Body parts bathed by patient: Right arm;Left arm;Chest;Abdomen;Front perineal area;Left upper leg;Right upper leg;Face     Assist Level: Moderate Assistance - Patient 50 - 74%    Upper Body Dressing(including orthotics)   What is the patient wearing?: Pull over shirt   Assist Level: Minimal Assistance - Patient > 75%    Lower Body  Dressing (excluding footwear)   What is the patient wearing?: Pants Assist for lower body dressing: Maximal Assistance - Patient 25 - 49%    Putting on/Taking off footwear   What is the patient wearing?: [place] for footwear: Dependent - Patient 0%       Care Tool Toileting Toileting activity   Assist for toileting: Moderate Assistance - Patient 50 - 74%     Care Tool Bed Mobility Roll left and right activity   Roll left and right assist level: Moderate Assistance - Patient 50 - 74%    Sit to lying activity   Sit to lying assist level: Moderate Assistance - Patient 50 - 74%    Lying to sitting edge of bed activity   Lying to sitting edge of bed assist level: Moderate Assistance - Patient 50 - 74%     Care Tool Transfers Sit to stand transfer   Sit to stand assist level: Moderate Assistance - Patient 50 - 74%    Chair/bed transfer   Chair/bed transfer assist level: Moderate Assistance  - Patient 50 - 74%     Toilet transfer   Assist Level: Moderate Assistance - Patient 50 - 74%     Care Tool Cognition Expression of Ideas and Wants Expression of Ideas and Wants: Without difficulty (complex and basic) - expresses complex messages without difficulty and with speech that is clear and easy to understand   Understanding Verbal and Non-Verbal Content Understanding Verbal and Non-Verbal Content: Understands (complex and basic) - clear comprehension without cues or repetitions   Memory/Recall Ability *first 3 days only Memory/Recall Ability *first 3 days only: Current season;Staff names and faces;That he or she is in a hospital/hospital unit    Refer to Care Plan for [facility] OT Short Term Goal 1 (Week 1): Pt will complete LB dressing with min assist using AE as needed. OT Short Term Goal 2 (Week 1): Pt will complete BSC transfer with CGA using LRAD. OT Short Term Goal 3 (Week 1): Pt will complete bathing using AE as needed with min assist. OT Short Term Goal 4 (Week 1): Pt will complete oral hygiene standing sinkside with CGA.  Recommendations for other services: Therapeutic Recreation  Stress management   Skilled Therapeutic Intervention ADL ADL Eating: Independent Where Assessed-Eating: Chair Grooming: Setup Where Assessed-Grooming: Sitting at sink Upper Body Bathing: Minimal assistance Where Assessed-Upper Body Bathing: Sitting at sink Lower Body Bathing: Maximal assistance Where Assessed-Lower Body Bathing: Sitting at sink;Standing at sink Upper Body Dressing: Minimal assistance Where Assessed-Upper Body Dressing: Sitting at sink Lower Body Dressing: Maximal assistance Where Assessed-Lower Body Dressing: Sitting at sink;Standing at sink Toileting: Minimal assistance Mobility  Bed Mobility Bed Mobility: Sit to Supine Rolling Right: Moderate Assistance - Patient 50-74% Rolling Left: Moderate Assistance - Patient 50-74% Supine to  Sit: Moderate Assistance - Patient 50-74% Sit to Supine: Moderate Assistance - Patient 50-74% Transfers Sit to Stand: Moderate Assistance - Patient 50-74% Stand to Sit: Moderate Assistance - Patient 50-74%   Skilled Intervention:   First session: Pt sitting up in recliner, agreeable to OT session. Reports feeling nauseous with episodes of gagging but little pain at rest. Pt also reports feeling periods of sadness due to recent fall occurrence and injury, and having some fear of falling again.  OT provided emotional support through therapeutic use of self to reduce pt anxiety.  Initial evaluation completed, education provided regarding OT scope of practice, and collaboration completed with pt  regarding POC.  Pt completed sinkside self care per above levels of assist.  Pt requesting back to bed until next therapy session.  Functional mobility completed per above levels of assist.  Call bell in reach, bed alarm on.   Second session:  Pt sitting in recliner, reporting 5/10 pain in LLE with movement.  Nurse made aware and prescribed medication administered by nurse during OT session per pt request.  Pt with scheduled doppler ultrasound for BLE therefore assisted pt to bed level with min assist for SPT using RW and min assist to support LLE sit to supine.  Call bell in reach, vascular tech present in room upon OT departure.  Discharge Criteria: Patient will be discharged from OT if patient refuses treatment 3 consecutive times without medical reason, if treatment goals not met, if there is a change in medical status, if patient makes no progress towards goals or if patient is discharged from hospital.  The above assessment, treatment plan, treatment alternatives and goals were discussed and mutually agreed upon: by patient  Ezekiel Slocumb 01/30/2020, 1:01 PM

## 2020-01-30 NOTE — Evaluation (Addendum)
Physical Therapy Assessment and Plan  Patient Details  Name: Kimberly Whitney MRN: [id number] Date of Birth: Sep 23, 1950  PT Diagnosis: Abnormality of gait, Difficulty walking, Dizziness and giddiness, Edema, Muscle weakness and Pain in L hip s/p IM Rehab Potential: Good ELOS: 2weeks   Today's Date: 01/30/2020 PT Individual Time: 0800-0900 + 1300-1400 PT Individual Time Calculation (min): 60 min  + 60 min  Hospital Problem: Principal Problem:   Intertrochanteric fracture of left hip ([place])   Past Medical History:  Past Medical History:  Diagnosis Date  . Hypertension   . Osteopenia    Past Surgical History:  Past Surgical History:  Procedure Laterality Date  . CHOLECYSTECTOMY    . FRACTURE SURGERY     L hip 30 years ago  . INTRAMEDULLARY (IM) NAIL INTERTROCHANTERIC Left 01/23/2020   Procedure: INTRAMEDULLARY (IM) NAIL INTERTROCHANTRIC;  Surgeon: Leandrew Koyanagi, MD;  Location: [facility];  Service: Orthopedics;  Laterality: Left;    Assessment & Plan Clinical Impression: Patient is a 69 year old right-handed female with history of hypertension, left hip fracture 30 years ago as well as tobacco use.  History taken from chart review and patient.  Patient lives with spouse.  Independent prior to admission.  1 level home 2 steps to entry.  She presented on 01/23/20 after mechanical fall when she was in her kitchen getting onto a stool ladder and missed a step.  No LOC. Admission chemistry sodium 133, potassium 3.0, glucose 157, WBC 18,800, CK 127.  Patient sustained left comminuted intertrochanteric hip fracture with varus angulation.  Underwent open treatment  with intramedullary implant on 01/24/20 per Dr.Xu.  Patient is 25% partial weightbearing.  Maintained on Lovenox for DVT prophylaxis.  Acute blood loss anemia with hemoglobin of 10.8 and monitored.  Therapy evaluations completed and patient was admitted for a comprehensive rehab program.  Patient currently requires mod with mobility secondary  to muscle weakness and unbalanced muscle activation.  Prior to hospitalization, patient was independent  with mobility and lived with Spouse in a House home.  Home access is Front: 2 with no rail. Garage: 2 with no rail. Back: 12-15 stepsStairs to enter.  Patient will benefit from skilled PT intervention to maximize safe functional mobility, minimize fall risk and decrease caregiver burden for planned discharge home with 24 hour assist.  Anticipate patient will benefit from follow up [facility] vs OP at discharge pending her progress and transportation.  PT - End of Session Activity Tolerance: Tolerates 10 - 20 min activity with multiple rests Endurance Deficit: Yes Endurance Deficit Description: Pain contributing. Required extra time for upright acclimation PT Assessment Rehab Potential (ACUTE/IP ONLY): Good PT Barriers to Discharge: Home environment access/layout;Insurance for SNF coverage;Weight bearing restrictions PT Barriers to Discharge Comments: 25% WB LLE PT Patient demonstrates impairments in the following area(s): Balance;Edema;Endurance;Motor;Pain;Safety PT Transfers Functional Problem(s): Bed Mobility;Bed to Chair;Car PT Locomotion Functional Problem(s): Ambulation;Stairs PT Plan PT Intensity: Minimum of 1-2 x/day ,45 to 90 minutes PT Frequency: 5 out of 7 days PT Duration Estimated Length of Stay: 2weeks PT Treatment/Interventions: Ambulation/gait training;Discharge planning;Functional mobility training;Psychosocial support;Therapeutic Activities;Visual/perceptual remediation/compensation;Wheelchair propulsion/positioning;Therapeutic Exercise;Skin care/wound management;Neuromuscular re-education;Disease management/prevention;Balance/vestibular training;Cognitive remediation/compensation;DME/adaptive equipment instruction;Pain management;Splinting/orthotics;UE/LE Strength taining/ROM;UE/LE Coordination activities;Stair training;Functional electrical stimulation;Patient/family  education;Community reintegration PT Transfers Anticipated Outcome(s): supervision with LRAD PT Locomotion Anticipated Outcome(s): supervision with LRAD PT Recommendation Recommendations for Other Services: Therapeutic Recreation consult Therapeutic Recreation Interventions: Stress management Follow Up Recommendations: 24 hour supervision/assistance;Home health PT Patient destination: Home Equipment Recommended: To be determined Equipment Details: Pt has a cane  and ?w/c   PT Evaluation Precautions/Restrictions Precautions Precautions: Fall Precaution Comments: Anxious with mobility Restrictions Weight Bearing Restrictions: Yes LLE Weight Bearing: Partial weight bearing LLE Partial Weight Bearing Percentage or Pounds: 25% LLE General Chart Reviewed: Yes Additional Pertinent History: HTN, osteopenia, L Hip fx ~30years ago Family/Caregiver Present: No  Pain Pain Assessment Pain Scale: 0-10 Pain Score: 2  Pain Type: Surgical pain Pain Location: Hip Pain Orientation: Left Pain Onset: With Activity Pain Intervention(s): RN made aware;Repositioned;Ambulation/increased activity;Relaxation;Elevated extremity;Emotional support Multiple Pain Sites: No Home Living/Prior Functioning Home Living Living Arrangements: Spouse/significant other Available Help at Discharge: Family;Available 24 hours/day;Other (Comment) (Husband and Daughter Investment banker, corporate)) Type of Home: House Home Access: Stairs to enter [employer] of Steps: Front: 2 with no rail. Garage: 2 with no rail. Back: 12-15 steps Entrance Stairs-Rails: None (Husband is working on either building rails or a temporary ramp while pt is in rehab) Home Layout: One level Bathroom Shower/Tub: Holiday representative Accessibility: Yes  Lives With: Spouse Prior Function Level of Independence: Independent with gait;Other (comment) (Fully indep PTA without DME needs)  Able to Take Stairs?: Yes Driving: Yes Vocation: Retired Leisure:  Hobbies-yes (Comment) Comments: Power walking on treadmill for 20-25 minutes, daily Vision/Perception  Perception Perception: Within Functional Limits Praxis Praxis: Intact  Cognition Overall Cognitive Status: Within Functional Limits for tasks assessed Arousal/Alertness: Awake/alert Orientation Level: Oriented X4 Attention: Focused;Sustained Focused Attention: Appears intact Sustained Attention: Appears intact Awareness: Appears intact Problem Solving: Impaired Problem Solving Impairment: Functional basic Safety/Judgment: Impaired Sensation Sensation Light Touch: Appears Intact Coordination Gross Motor Movements are Fluid and Coordinated: No Fine Motor Movements are Fluid and Coordinated: Yes Coordination and Movement Description: LLE pain limiting, anxious with mobility Motor  Motor Motor: Abnormal postural alignment and control;Other (comment) Motor - Skilled Clinical Observations: pain limiting with L hip fx and s/p LMN. Effortful movement  Trunk/Postural Assessment  Cervical Assessment Cervical Assessment: Exceptions to [facility] (forward head) Thoracic Assessment Thoracic Assessment: Exceptions to [facility] (mild kyphosis) Lumbar Assessment Lumbar Assessment: Exceptions to [facility] (sacral sitting with posterior pelvis) Postural Control Postural Control: Deficits on evaluation (R lateral lean in sitting to offload L hip pain)  Balance Balance Balance Assessed: Yes Static Sitting Balance Static Sitting - Balance Support: Feet supported;Left upper extremity supported Static Sitting - Level of Assistance: 5: Stand by assistance Dynamic Sitting Balance Sitting balance - Comments: Reliant on LUE support to offload L hip for pain management Static Standing Balance Static Standing - Balance Support: Bilateral upper extremity supported Static Standing - Level of Assistance: 4: Min assist Dynamic Standing Balance Dynamic Standing - Balance Support: During functional activity;Bilateral  upper extremity supported Dynamic Standing - Level of Assistance: 3: Mod assist Extremity Assessment  RUE Assessment RUE Assessment: Within Functional Limits General Strength Comments: Grossly 5/5 LUE Assessment LUE Assessment: Within Functional Limits General Strength Comments: Grossly 5/5 RLE Assessment RLE Assessment: Exceptions to [facility] General Strength Comments: Grossly 4/5 LLE Assessment LLE Assessment: Exceptions to [facility] General Strength Comments: Pain limiting. Ankle 4/5, knee ext 4-/5, hip flex 2/5  Care Tool Care Tool Bed Mobility Roll left and right activity   Roll left and right assist level: Moderate Assistance - Patient 50 - 74%    Sit to lying activity   Sit to lying assist level: Moderate Assistance - Patient 50 - 74%    Lying to sitting edge of bed activity   Lying to sitting edge of bed assist level: Moderate Assistance - Patient 50 - 74%     Care Tool  Transfers Sit to stand transfer   Sit to stand assist level: Moderate Assistance - Patient 50 - 74%    Chair/bed transfer   Chair/bed transfer assist level: Moderate Assistance - Patient 50 - 74%     Toilet transfer   Assist Level: Moderate Assistance - Patient 50 - 74%    Car transfer Car transfer activity did not occur: Safety/medical concerns        Care Tool Locomotion Ambulation   Assist level: Minimal Assistance - Patient > 75%      Walk 10 feet activity   Assist level: Minimal Assistance - Patient > 75%     Walk 50 feet with 2 turns activity Walk 50 feet with 2 turns activity did not occur: Safety/medical concerns      Walk 150 feet activity Walk 150 feet activity did not occur: Safety/medical concerns      Walk 10 feet on uneven surfaces activity Walk 10 feet on uneven surfaces activity did not occur: Safety/medical concerns      Stairs Stair activity did not occur: Safety/medical concerns        Walk up/down 1 step activity Walk up/down 1 step or curb (drop down) activity did not  occur: Safety/medical concerns        Walk up/down 4 steps activity   Walk up/down 4 steps activity did not occur: Safety/medical concerns    Walk up/down 12 steps activity Walk up/down 12 steps activity did not occur: Safety/medical concerns      Pick up small objects from floor Pick up small object from the floor (from standing position) activity did not occur: Safety/medical concerns      Wheelchair Will patient use wheelchair at discharge?: No          Wheel 50 feet with 2 turns activity      Wheel 150 feet activity        Refer to Care Plan for Long Term Goals  SHORT TERM GOAL WEEK 1 PT Short Term Goal 1 (Week 1): Pt will perform bed mobility with minA PT Short Term Goal 2 (Week 1): Pt will perform bed<>chair transfers with minA and LRAD PT Short Term Goal 3 (Week 1): Pt will ambulate at least 33ft with minA and LRAD PT Short Term Goal 4 (Week 1): Pt will negotiate up/down at least 2 steps with modA and LRAD  Recommendations for other services: Therapeutic Recreation  Stress management  Skilled Therapeutic Intervention Mobility Bed Mobility Bed Mobility: Rolling Right;Rolling Left;Supine to Sit Rolling Right: Moderate Assistance - Patient 50-74% Rolling Left: Moderate Assistance - Patient 50-74% Supine to Sit: Moderate Assistance - Patient 50-74% Transfers Transfers: Sit to Stand;Stand to Sit;Stand Pivot Transfers Sit to Stand: Moderate Assistance - Patient 50-74% Stand to Sit: Moderate Assistance - Patient 50-74% Stand Pivot Transfers: Moderate Assistance - Patient 50 - 74% Stand Pivot Transfer Details: Verbal cues for gait pattern;Visual cues/gestures for sequencing;Verbal cues for sequencing;Verbal cues for safe use of DME/AE;Verbal cues for precautions/safety;Visual cues/gestures for precautions/safety;Visual cues for safe use of DME/AE;Tactile cues for sequencing;Tactile cues for posture Stand Pivot Transfer Details (indicate cue type and reason): required  elevated EOB to complete 2/2 weakness and LLE pain Transfer (Assistive device): Rolling walker Locomotion    Skilled Intervention: 1st session: Pt received supine in bed, awake and agreeable to PT session. Pt reports resting 2/10 L hip pain, progressing to 6/10 with mobility during our session. Provided frequent rest breaks, emotional support, and repositioning for pain management. Donned scrub pants with  maxA while supine, rolling L<>R with modA (increased difficulty rolling L 2/2 L hip pain). Supine<>sit with modA with assist for both trunk elevation and LLE management. Required extra time sitting EOB for upright acclimation for both pain and slight dizziness. Unable to stand from lowered EOB height with RW and required boost with elevated bed with minA and RW, cues for WB status on LLE. Stand<>pivot with min/modA and RW from EOB to recliner, towards her weaker L side. Able to scoot herself back with minA in recliner for repositioning. Ended session seated in recliner with BLE elevated and pillow supporting LLE, needs in reach. Informed her of PT POC, role of PT/OT services, treatment goals, barriers to home, and role of f/u therapy services. Pt verbalized understanding and in agreement.  2nd session: Pt received supine in bed, husband at bedside, pt agreeable to PT session. Pt reports resting 3/10 L hip pain, progressing to 5/10 with mobility during the session and PT provided frequent rest breaks, guiding breathing, repositioning, and emotional support for pain management. Donned pants with maxA while supine in bed. Supine<>sit with modA towards L EOB with assist for LLE management and trunk to upright. Sit<>stand with minA and RW from raised EOB height and stand<>pivot with min/modA and RW to w/c. WC transport for time management during our session and stopped at w/c closet to provide seat cushion for comfort. Attempted to perform car transfer, requiring modA for transfer but she was unable to bring  her LLE inside the car due to limited knee>hip flexion pain, thus deferred full transfer for safety. She ambulated ~39f with minA and RW in dayroom gym, demo's NWB LLE and hop-to gait pattern with fair technique. After seated rest, she performed another trial of gait, ambulated ~229fwith minA and RW with similar gait pattern. Reinforced she is able to perform 25% WBing but she continued to defer 2/2 fear of pain. Will continue to progress WB as tolerated to 25% restriction. WC transport back to her room, stand<>pivot with minA from w/c to recliner, ended session sitting in recliner with BLE elevated and pillow supporting LLE. Husband at bedside and needs in reach. Pt voiced eagerness to improve mobility to go home.  Instructed pt in results of PT evaluation as detailed above, PT POC, rehab potential, rehab goals, and discharge recommendations. Additionally discussed CIR's policies regarding fall safety and use of chair alarm and/or quick release belt. Pt verbalized understanding and in agreement. Will update pt's family members as they become available.   Discharge Criteria: Patient will be discharged from PT if patient refuses treatment 3 consecutive times without medical reason, if treatment goals not met, if there is a change in medical status, if patient makes no progress towards goals or if patient is discharged from hospital.  The above assessment, treatment plan, treatment alternatives and goals were discussed and mutually agreed upon: by patient  ChAlger SimonsT, DPT 01/30/2020, 10:15 AM

## 2020-01-30 NOTE — Progress Notes (Signed)
[facility] PHYSICAL MEDICINE & REHABILITATION PROGRESS NOTE   Subjective/Complaints: Patient seen lying in bed this morning.  She states she slept well overnight.  She states she has a nausea this morning, but thinks it is because of her positioning while eating breakfast.  She is ready to begin therapies.  ROS: Denies CP, SOB, N/V/D  Objective:   No results found. Recent Labs    01/30/20 0914  WBC 8.0  HGB 10.7*  HCT 32.9*  PLT 346   Recent Labs    01/30/20 0914  NA 134*  K 3.7  CL 99  CO2 27  GLUCOSE 117*  BUN 16  CREATININE 0.79  CALCIUM 9.3    Intake/Output Summary (Last 24 hours) at 01/30/2020 1507 Last data filed at 01/30/2020 1300 Gross per 24 hour  Intake 600 ml  Output --  Net 600 ml        Physical Exam: Vital Signs Blood pressure (!) 109/52, pulse 74, temperature 98.7 F (37.1 C), resp. rate 19, height 5\' 5"  (1.651 m), weight 82.6 kg, SpO2 94 %. Constitutional: No distress . Vital signs reviewed. HENT: Normocephalic.  Atraumatic. Eyes: EOMI. No discharge. Cardiovascular: No JVD.  RRR. Respiratory: Normal effort.  No stridor.  Bilateral clear to auscultation. GI: Non-distended.  BS +. Skin: Warm and dry.  Incision with dressing CDI Psych: Normal mood.  Normal behavior. Musc: Left hip with edema and tenderness Neuro: Alert Motor: Left lower extremity: Hip flexion 2/5, knee extension 3/5, ankle dorsiflexion 4+/5   Assessment/Plan: 1. Functional deficits secondary to comminuted left intertrochanteric hip fracture status post intramedullary implant which require 3+ hours per day of interdisciplinary therapy in a comprehensive inpatient rehab setting.  Physiatrist is providing close team supervision and 24 hour management of active medical problems listed below.  Physiatrist and rehab team continue to assess barriers to discharge/monitor patient progress toward functional and medical goals  Care Tool:  Bathing    Body parts bathed by patient:  Right arm, Left arm, Chest, Abdomen, Front perineal area, Left upper leg, Right upper leg, Face         Bathing assist Assist Level: Moderate Assistance - Patient 50 - 74%     Upper Body Dressing/Undressing Upper body dressing   What is the patient wearing?: Pull over shirt    Upper body assist Assist Level: Minimal Assistance - Patient > 75%    Lower Body Dressing/Undressing Lower body dressing      What is the patient wearing?: Pants     Lower body assist Assist for lower body dressing: Maximal Assistance - Patient 25 - 49%     Toileting Toileting    Toileting assist Assist for toileting: Moderate Assistance - Patient 50 - 74%     Transfers Chair/bed transfer  Transfers assist     Chair/bed transfer assist level: Moderate Assistance - Patient 50 - 74%     Locomotion Ambulation   Ambulation assist      Assist level: Minimal Assistance - Patient > 75% Assistive device: Walker-rolling Max distance: 53ft   Walk 10 feet activity   Assist     Assist level: Minimal Assistance - Patient > 75% Assistive device: Walker-rolling   Walk 50 feet activity   Assist Walk 50 feet with 2 turns activity did not occur: Safety/medical concerns         Walk 150 feet activity   Assist Walk 150 feet activity did not occur: Safety/medical concerns         Walk  10 feet on uneven surface  activity   Assist Walk 10 feet on uneven surfaces activity did not occur: Safety/medical concerns         Wheelchair     Assist Will patient use wheelchair at discharge?: No             Wheelchair 50 feet with 2 turns activity    Assist            Wheelchair 150 feet activity     Assist          Medical Problem List and Plan: 1.  Decreased functional mobility secondary to comminuted left intertrochanteric hip fracture with varus angulation.  Status post open treatment with intramedullary implant 01/24/2020.  25% partial  weightbearing.  Begin CIR evaluations  2.  Antithrombotics: -DVT/anticoagulation: Lovenox.               Vascular study pending             -antiplatelet therapy: N/A 3. Pain Management: Oxycodone and Flexeril as needed  Monitor with increased exertion 4. Mood: Provide emotional support             -antipsychotic agents: N/A 5. Neuropsych: This patient is capable of making decisions on her own behalf. 6. Skin/Wound Care: Routine skin checks 7. Fluids/Electrolytes/Nutrition: Routine in and outs.  8.  Acute blood loss anemia.               Hemoglobin 10.7 on 9/30  Continue to monitor 9.  Hypertension.  Chlorthalidone 50 mg daily, Cozaar 100 mg daily, Toprol-XL 50 mg daily.               Monitor with increased mobility 11. Drug induced constipation.  Senokot S daily and MiraLAX daily             Improving  Adjust bowel meds as necessary 12.  Hyponatremia  Sodium 134 on 9/30  Continue to monitor  13.  Transaminitis  ALT mildly elevated on 9/30, continue to monitor   LOS: 1 days A FACE TO FACE EVALUATION WAS PERFORMED  Rox Mcgriff Lorie Phenix 01/30/2020, 3:07 PM

## 2020-01-30 NOTE — Progress Notes (Signed)
[facility] Individual Statement of Services  Patient Name:  Kimberly Whitney  Date:  01/30/2020  Welcome to the [facility].  Our goal is to provide you with an individualized program based on your diagnosis and situation, designed to meet your specific needs.  With this comprehensive rehabilitation program, you will be expected to participate in at least 3 hours of rehabilitation therapies Monday-Friday, with modified therapy programming on the weekends.  Your rehabilitation program will include the following services:  Physical Therapy (PT), Occupational Therapy (OT), 24 hour per day rehabilitation nursing, Care Coordinator, Rehabilitation Medicine, Nutrition Services and Pharmacy Services  Weekly team conferences will be held on Wednesday to discuss your progress.  Your Inpatient Rehabilitation Care Coordinator will talk with you frequently to get your input and to update you on team discussions.  Team conferences with you and your family in attendance may also be held.  Expected length of stay: 12-14 days  Overall anticipated outcome: supervision-CGA level  Depending on your progress and recovery, your program may change. Your Inpatient Rehabilitation Care Coordinator will coordinate services and will keep you informed of any changes. Your Inpatient Rehabilitation Care Coordinator's name and contact numbers are listed  below.  The following services may also be recommended but are not provided by the [facility] will be made to provide these services after discharge if needed.  Arrangements include referral to agencies that provide these services.  Your insurance has been verified to be:  [place] Your primary doctor is:  None  Pertinent information will be shared with your doctor and your insurance  company.  Inpatient Rehabilitation Care Coordinator:  Ovidio Kin, [place] or ([phone]  Information discussed with and copy given to patient by: Elease Hashimoto, 01/30/2020, 9:30 AM

## 2020-01-30 NOTE — Progress Notes (Signed)
Lower extremity venous bilateral study completed.   Please see CV Proc for preliminary results.   Desteni Piscopo, RDMS  

## 2020-01-31 ENCOUNTER — Inpatient Hospital Stay (HOSPITAL_COMMUNITY): Payer: BC Managed Care – PPO

## 2020-01-31 ENCOUNTER — Inpatient Hospital Stay (HOSPITAL_COMMUNITY): Payer: BC Managed Care – PPO | Admitting: Occupational Therapy

## 2020-01-31 NOTE — Progress Notes (Addendum)
Physical Therapy Session Note  Patient Details  Name: Kimberly Whitney MRN: [id number] Date of Birth: 04-19-1951  Today's Date: 01/31/2020 PT Individual Time: 0800-0900 PT Individual Time Calculation (min): 60 min   Short Term Goals: Week 1:  PT Short Term Goal 1 (Week 1): Pt will perform bed mobility with minA PT Short Term Goal 2 (Week 1): Pt will perform bed<>chair transfers with minA and LRAD PT Short Term Goal 3 (Week 1): Pt will ambulate at least 16ft with minA and LRAD PT Short Term Goal 4 (Week 1): Pt will negotiate up/down at least 2 steps with modA and LRAD  Skilled Therapeutic Interventions/Progress Updates:    PAIN reports pain has decreased since yesterday, did not rate but care taken w/therex/mobility and rest and repositioning provided as needed.  Pt initially supine and requesting to get dressed.  Supine to sit w/min assist to manage LLE and use of rails.  Doffs nightshirt and dons clean shirt w/set up.  Raises feet for therapist to thread pants, pt able to raise to upper thighs in sitting w/min assist.  STS w/RW PWBing LLE and mod assist to complete raising pants. Returns to sitting w/cga.  Shoes/socks donned by therapist for time management.  Pt then transported to sink where she brushes teeth, hair, and washes face w/set up only.  wc propulsion 41ft x 3 w/cues for efficient technique and for straight path.   Pt educated re: step to gait pattern leading w/LLE to facilitate PWBing  STS and gait x 29ft w/RW, step to, PWBing w/cues for sequencing and maintaining safe distance within walker.  Seated therex: Knee flexion x 15 w/foot on wascloth/sliding board to decrease resistance, encouraged to maximize flexion AROM/to tolerance. Quad sets x 15 LAQs x 12 Hip abd/add x 8  Pt educated re wc parts management, therapist demonstrated operation of brakes and leg rests and pt then practiced operation of parts.  Gait 64ft as described above. Pt transported back to room at end of  session.  Encouraged to remain oob in wc x 1 hr until next PT session.  Pt agreed.  Pt left oob in wc w/alarm belt set and needs in reach  Reports decreased pain today, describes "stiffness" in LLE.  Gait distance limited by fatigue.  Therapy Documentation Precautions:  Precautions Precautions: Fall Precaution Comments: Anxious with mobility Restrictions Weight Bearing Restrictions: Yes LLE Weight Bearing: Partial weight bearing LLE Partial Weight Bearing Percentage or Pounds: 25%LLE    Therapy/Group: Individual Therapy  Callie Fielding, PT   Jerrilyn Cairo 01/31/2020, 12:47 PM

## 2020-01-31 NOTE — Progress Notes (Signed)
BP 96/56-111/56; HR 65-78; MD notified new orders noted to hold metoprolol scheduled at 6pm.

## 2020-01-31 NOTE — Progress Notes (Signed)
[facility] PHYSICAL MEDICINE & REHABILITATION PROGRESS NOTE   Subjective/Complaints: Patient seen sitting up working with therapy this morning.  She states she slept much better overnight.  She states she had a good first day of therapies yesterday and believes she is doing even better this morning with therapies.  Confirmed with therapies patient progress.  ROS: Denies CP, SOB, N/V/D  Objective:   VAS [place] LOWER EXTREMITY VENOUS (DVT)  Result Date: 01/30/2020  Lower Venous DVTStudy Indications: Swelling.  Risk Factors: Immobility Surgery 01-23-2020 Intramedullary nail intertrochantric (LT hip). Limitations: Poor ultrasound/tissue interface. Comparison Study: No prior studies. Performing Technologist: Darlin Coco  Examination Guidelines: A complete evaluation includes B-mode imaging, spectral Doppler, color Doppler, and power Doppler as needed of all accessible portions of each vessel. Bilateral testing is considered an integral part of a complete examination. Limited examinations for reoccurring indications may be performed as noted. The reflux portion of the exam is performed with the patient in reverse Trendelenburg.  +---------+---------------+---------+-----------+----------+--------------+ RIGHT    CompressibilityPhasicitySpontaneityPropertiesThrombus Aging +---------+---------------+---------+-----------+----------+--------------+ CFV      Full           Yes      Yes                                 +---------+---------------+---------+-----------+----------+--------------+ SFJ      Full                                                        +---------+---------------+---------+-----------+----------+--------------+ FV Prox  Full                                                        +---------+---------------+---------+-----------+----------+--------------+ FV Mid   Full                                                         +---------+---------------+---------+-----------+----------+--------------+ FV DistalFull                                                        +---------+---------------+---------+-----------+----------+--------------+ PFV      Full                                                        +---------+---------------+---------+-----------+----------+--------------+ POP      Full           Yes      Yes                                 +---------+---------------+---------+-----------+----------+--------------+ PTV  Full                                                        +---------+---------------+---------+-----------+----------+--------------+ PERO     Full                                                        +---------+---------------+---------+-----------+----------+--------------+   +---------+---------------+---------+-----------+----------+--------------+ LEFT     CompressibilityPhasicitySpontaneityPropertiesThrombus Aging +---------+---------------+---------+-----------+----------+--------------+ CFV      Full           Yes      Yes                                 +---------+---------------+---------+-----------+----------+--------------+ SFJ      Full                                                        +---------+---------------+---------+-----------+----------+--------------+ FV Prox  Full                                                        +---------+---------------+---------+-----------+----------+--------------+ FV Mid   Full                                                        +---------+---------------+---------+-----------+----------+--------------+ FV Distal               Yes      Yes                                 +---------+---------------+---------+-----------+----------+--------------+ PFV      Full                                                         +---------+---------------+---------+-----------+----------+--------------+ POP      Full           Yes      Yes                                 +---------+---------------+---------+-----------+----------+--------------+ PTV      Full                                                        +---------+---------------+---------+-----------+----------+--------------+  PERO     Full                                                        +---------+---------------+---------+-----------+----------+--------------+     Summary: RIGHT: - There is no evidence of deep vein thrombosis in the lower extremity.  - No cystic structure found in the popliteal fossa.  LEFT: - There is no evidence of deep vein thrombosis in the lower extremity. However, portions of this examination were limited- see technologist comments above.  - No cystic structure found in the popliteal fossa.  *See table(s) above for measurements and observations. Electronically signed by Monica Martinez MD on 01/30/2020 at 5:06:16 PM.    Final    Recent Labs    01/30/20 0914  WBC 8.0  HGB 10.7*  HCT 32.9*  PLT 346   Recent Labs    01/30/20 0914  NA 134*  K 3.7  CL 99  CO2 27  GLUCOSE 117*  BUN 16  CREATININE 0.79  CALCIUM 9.3    Intake/Output Summary (Last 24 hours) at 01/31/2020 1231 Last data filed at 01/31/2020 0730 Gross per 24 hour  Intake 720 ml  Output --  Net 720 ml        Physical Exam: Vital Signs Blood pressure (!) 120/52, pulse 71, temperature 98.5 F (36.9 C), resp. rate 14, height 5\' 5"  (1.651 m), weight 82.6 kg, SpO2 97 %.  Constitutional: No distress . Vital signs reviewed. HENT: Normocephalic.  Atraumatic. Eyes: EOMI. No discharge. Cardiovascular: No JVD.  RRR. Respiratory: Normal effort.  No stridor.  Bilateral clear to auscultation. GI: Non-distended.  BS +. Skin: Warm and dry.  Incision with dressing CDI Psych: Normal mood.  Normal behavior. Musc: Left hip with edema and tenderness,  improving Neuro: Alert Motor: Left lower extremity: Hip flexion 2+-3 -/5, knee extension 4 -/5, ankle dorsiflexion 4+/5   Assessment/Plan: 1. Functional deficits secondary to comminuted left intertrochanteric hip fracture status post intramedullary implant which require 3+ hours per day of interdisciplinary therapy in a comprehensive inpatient rehab setting.  Physiatrist is providing close team supervision and 24 hour management of active medical problems listed below.  Physiatrist and rehab team continue to assess barriers to discharge/monitor patient progress toward functional and medical goals  Care Tool:  Bathing    Body parts bathed by patient: Right arm, Left arm, Chest, Abdomen, Front perineal area, Left upper leg, Right upper leg, Face         Bathing assist Assist Level: Moderate Assistance - Patient 50 - 74%     Upper Body Dressing/Undressing Upper body dressing   What is the patient wearing?: Pull over shirt    Upper body assist Assist Level: Minimal Assistance - Patient > 75%    Lower Body Dressing/Undressing Lower body dressing      What is the patient wearing?: Pants, Underwear/pull up     Lower body assist Assist for lower body dressing: Moderate Assistance - Patient 50 - 74%     Toileting Toileting    Toileting assist Assist for toileting: Moderate Assistance - Patient 50 - 74%     Transfers Chair/bed transfer  Transfers assist     Chair/bed transfer assist level: Minimal Assistance - Patient > 75%     Locomotion Ambulation   Ambulation assist  Assist level: Minimal Assistance - Patient > 75% Assistive device: Walker-rolling Max distance: 59ft   Walk 10 feet activity   Assist     Assist level: Minimal Assistance - Patient > 75% Assistive device: Walker-rolling   Walk 50 feet activity   Assist Walk 50 feet with 2 turns activity did not occur: Safety/medical concerns         Walk 150 feet activity   Assist Walk  150 feet activity did not occur: Safety/medical concerns         Walk 10 feet on uneven surface  activity   Assist Walk 10 feet on uneven surfaces activity did not occur: Safety/medical concerns         Wheelchair     Assist Will patient use wheelchair at discharge?: No             Wheelchair 50 feet with 2 turns activity    Assist            Wheelchair 150 feet activity     Assist          Medical Problem List and Plan: 1.  Decreased functional mobility secondary to comminuted left intertrochanteric hip fracture with varus angulation.  Status post open treatment with intramedullary implant 01/24/2020.  25% partial weightbearing.  Continue CIR 2.  Antithrombotics: -DVT/anticoagulation: Lovenox.               Vascular study limited, but negative             -antiplatelet therapy: N/A 3. Pain Management: Oxycodone and Flexeril as needed  Controlled with meds on 10/1  Monitor with increased exertion 4. Mood: Provide emotional support             -antipsychotic agents: N/A 5. Neuropsych: This patient is capable of making decisions on her own behalf. 6. Skin/Wound Care: Routine skin checks 7. Fluids/Electrolytes/Nutrition: Routine in and outs.  8.  Acute blood loss anemia.               Hemoglobin 10.7 on 9/30  Continue to monitor 9.  Hypertension.  Chlorthalidone 50 mg daily, Cozaar 100 mg daily, Toprol-XL 50 mg daily.    Controlled with meds on 10/1             Monitor with increased mobility 11. Drug induced constipation.  Senokot S daily and MiraLAX daily             Improving  Adjust bowel meds as necessary 12.  Hyponatremia  Sodium 134 on 9/30, labs ordered for Monday  Continue to monitor  13.  Transaminitis  ALT mildly elevated on 9/30, labs ordered for Monday   LOS: 2 days A FACE TO FACE EVALUATION WAS PERFORMED  Caedon Bond Lorie Phenix 01/31/2020, 12:31 PM

## 2020-01-31 NOTE — Progress Notes (Signed)
Occupational Therapy Session Note  Patient Details  Name: Kimberly Whitney MRN: [id number] Date of Birth: Jan 04, 1951  Today's Date: 01/31/2020 OT Individual Time: 1415-1500 OT Individual Time Calculation (min): 45 min    Short Term Goals: Week 1:  OT Short Term Goal 1 (Week 1): Pt will complete LB dressing with min assist using AE as needed. OT Short Term Goal 2 (Week 1): Pt will complete BSC transfer with CGA using LRAD. OT Short Term Goal 3 (Week 1): Pt will complete bathing using AE as needed with min assist. OT Short Term Goal 4 (Week 1): Pt will complete oral hygiene standing sinkside with CGA.  Skilled Therapeutic Interventions/Progress Updates:  Patient met seated in recliner in agreement with OT treatment session with focus on BADL transfers, wc mobility, and bed mobility as detailed below. Patient completes stand-pivot transfers with use of RW and occasional cues for hand placement and adherence to RLE PWB precautions. Patient with request to work on [facility] transfers and bed mobility. Wc mobility to ADL apartment without need for rest break. Patient demonstrates stand-pivot transfer x2 from wc to [facility] and EOB <> supine transfer with Min A to guide LLE from EOB <> bed surface. Wc mobility back to room in same manner as stated above. Session concluded with patient seated in recliner with all needs in reach. Husband present at bedside.   Therapy Documentation Precautions:  Precautions Precautions: Fall Precaution Comments: Anxious with mobility Restrictions Weight Bearing Restrictions: Yes LLE Weight Bearing: Partial weight bearing LLE Partial Weight Bearing Percentage or Pounds: 25%LLE General:    Therapy/Group: Individual Therapy  Kimberly Whitney 01/31/2020, 12:40 PM

## 2020-01-31 NOTE — Progress Notes (Signed)
Physical Therapy Session Note  Patient Details  Name: Kimberly Whitney MRN: [id number] Date of Birth: 22-Dec-1950  Today's Date: 01/31/2020 PT Individual Time: 1000-1100 PT Individual Time Calculation (min): 60 min   Short Term Goals: Week 1:  PT Short Term Goal 1 (Week 1): Pt will perform bed mobility with minA PT Short Term Goal 2 (Week 1): Pt will perform bed<>chair transfers with minA and LRAD PT Short Term Goal 3 (Week 1): Pt will ambulate at least 43ft with minA and LRAD PT Short Term Goal 4 (Week 1): Pt will negotiate up/down at least 2 steps with modA and LRAD  Skilled Therapeutic Interventions/Progress Updates:    Pt received sitting in w/c, husband at bedside, pt agreeable to PT session. She reports mild unrated L hip/knee pain while resting, providing mobility and gentle stretching for pain management during our session. WC transport for time management from her room to dayroom gym. She then ambulated 18ft + 34ft (seated rest break) with CGA/minA and RW, encouraging her to perform PWB (25%) through LLE during gait which she was able to do today! She continues to be moderately anxious regarding mobility but is redirectable with emotional support and extra time. Performed seated heel slides with LLE using pillowcase on floor to reduce friction, focusing on improving L knee flexion. Measuring L knee flexion using goniometer, reading 105deg knee flexion while seated. Performed standing foot-taps with RW and minA on 3inch platform with LLE, focusing again on L knee flexion and L hip flexion, able to performed 3x8 reps (seated rest). Noted circumduction with efforts that was able to be improved to more sagital plane flexion with cues. WC transport back to her room with totalA for time management, stand<>pivot with minA and RW from w/c to recliner. Provided ice pack for L hip/knee pain at end of session. Ended session seated in recliner with BLE elevated, needs in reach, husband at bedside.  Therapy  Documentation Precautions:  Precautions Precautions: Fall Precaution Comments: Anxious with mobility Restrictions Weight Bearing Restrictions: Yes LLE Weight Bearing: Partial weight bearing LLE Partial Weight Bearing Percentage or Pounds: 25% LLE  Therapy/Group: Individual Therapy  Medrith Veillon P Meric Joye PT 01/31/2020, 10:16 AM

## 2020-01-31 NOTE — Progress Notes (Signed)
Occupational Therapy Session Note  Patient Details  Name: Kimberly Whitney MRN: [id number] Date of Birth: Jan 16, 1951  Today's Date: 01/31/2020 OT Individual Time: 3559-7416 OT Individual Time Calculation (min): 32 min   Short Term Goals: Week 1:  OT Short Term Goal 1 (Week 1): Pt will complete LB dressing with min assist using AE as needed. OT Short Term Goal 2 (Week 1): Pt will complete BSC transfer with CGA using LRAD. OT Short Term Goal 3 (Week 1): Pt will complete bathing using AE as needed with min assist. OT Short Term Goal 4 (Week 1): Pt will complete oral hygiene standing sinkside with CGA.  Skilled Therapeutic Interventions/Progress Updates:    Pt greeted in the recliner, just finished lunch. She reported pain was manageable for tx, already had an ice pack on the Lt hip. Discussed AE options for donning/doffing footwear with pt stating that she has a reacher + shoe horn at home currently. Had her practice removing footwear using the reacher and donning gripper socks then donning regular socks/sneakers combination. She practiced using the shoe horn, sock aide, and also the shoe funnel. Pt reports she may independently purchase the sock aide and shoe funnel for home use. Spouse present during session and actively observing AE education, collaborating with therapist and pt regarding d/c. Pt was able to lift her Rt foot closer and body/obtain figure 4 to doff/don regular socks only, AE needed for all other aspects of donning/doffing footwear for pain mgt. At end of session pt remained in the recliner with all needs within reach and spouse present.   Therapy Documentation Precautions:  Precautions Precautions: Fall Precaution Comments: Anxious with mobility Restrictions Weight Bearing Restrictions: Yes LLE Weight Bearing: Partial weight bearing LLE Partial Weight Bearing Percentage or Pounds: 25%LLE Vital Signs: Therapy Vitals Temp: 98.4 F (36.9 C) Pulse Rate: 79 Resp: 18 BP: (!)  96/50 Patient Position (if appropriate): Sitting Oxygen Therapy SpO2: 97 % O2 Device: Room Air ADL: ADL Eating: Independent Where Assessed-Eating: Chair Grooming: Setup Where Assessed-Grooming: Sitting at sink Upper Body Bathing: Minimal assistance Where Assessed-Upper Body Bathing: Sitting at sink Lower Body Bathing: Maximal assistance Where Assessed-Lower Body Bathing: Sitting at sink, Standing at sink Upper Body Dressing: Minimal assistance Where Assessed-Upper Body Dressing: Sitting at sink Lower Body Dressing: Maximal assistance Where Assessed-Lower Body Dressing: Sitting at sink, Standing at sink Toileting: Minimal assistance      Therapy/Group: Individual Therapy  Porter Moes A Suleiman Finigan 01/31/2020, 4:01 PM

## 2020-02-01 NOTE — Progress Notes (Signed)
PHYSICAL MEDICINE & REHABILITATION PROGRESS NOTE   Subjective/Complaints: Disappointed she did not have therapy today. She would really like to go home. Has 24/7 supervision from her husband. Feels she did well with therapy yesterday.   ROS: Denies CP, SOB, N/V/D  Objective:   VAS [place] LOWER EXTREMITY VENOUS (DVT)  Result Date: 01/30/2020  Lower Venous DVTStudy Indications: Swelling.  Risk Factors: Immobility Surgery 01-23-2020 Intramedullary nail intertrochantric (LT hip). Limitations: Poor ultrasound/tissue interface. Comparison Study: No prior studies. Performing Technologist: Darlin Coco  Examination Guidelines: A complete evaluation includes B-mode imaging, spectral Doppler, color Doppler, and power Doppler as needed of all accessible portions of each vessel. Bilateral testing is considered an integral part of a complete examination. Limited examinations for reoccurring indications may be performed as noted. The reflux portion of the exam is performed with the patient in reverse Trendelenburg.  +---------+---------------+---------+-----------+----------+--------------+ RIGHT    CompressibilityPhasicitySpontaneityPropertiesThrombus Aging +---------+---------------+---------+-----------+----------+--------------+ CFV      Full           Yes      Yes                                 +---------+---------------+---------+-----------+----------+--------------+ SFJ      Full                                                        +---------+---------------+---------+-----------+----------+--------------+ FV Prox  Full                                                        +---------+---------------+---------+-----------+----------+--------------+ FV Mid   Full                                                        +---------+---------------+---------+-----------+----------+--------------+ FV DistalFull                                                         +---------+---------------+---------+-----------+----------+--------------+ PFV      Full                                                        +---------+---------------+---------+-----------+----------+--------------+ POP      Full           Yes      Yes                                 +---------+---------------+---------+-----------+----------+--------------+ PTV      Full                                                        +---------+---------------+---------+-----------+----------+--------------+  PERO     Full                                                        +---------+---------------+---------+-----------+----------+--------------+   +---------+---------------+---------+-----------+----------+--------------+ LEFT     CompressibilityPhasicitySpontaneityPropertiesThrombus Aging +---------+---------------+---------+-----------+----------+--------------+ CFV      Full           Yes      Yes                                 +---------+---------------+---------+-----------+----------+--------------+ SFJ      Full                                                        +---------+---------------+---------+-----------+----------+--------------+ FV Prox  Full                                                        +---------+---------------+---------+-----------+----------+--------------+ FV Mid   Full                                                        +---------+---------------+---------+-----------+----------+--------------+ FV Distal               Yes      Yes                                 +---------+---------------+---------+-----------+----------+--------------+ PFV      Full                                                        +---------+---------------+---------+-----------+----------+--------------+ POP      Full           Yes      Yes                                  +---------+---------------+---------+-----------+----------+--------------+ PTV      Full                                                        +---------+---------------+---------+-----------+----------+--------------+ PERO     Full                                                        +---------+---------------+---------+-----------+----------+--------------+  Summary: RIGHT: - There is no evidence of deep vein thrombosis in the lower extremity.  - No cystic structure found in the popliteal fossa.  LEFT: - There is no evidence of deep vein thrombosis in the lower extremity. However, portions of this examination were limited- see technologist comments above.  - No cystic structure found in the popliteal fossa.  *See table(s) above for measurements and observations. Electronically signed by Monica Martinez MD on 01/30/2020 at 5:06:16 PM.    Final    Recent Labs    01/30/20 0914  WBC 8.0  HGB 10.7*  HCT 32.9*  PLT 346   Recent Labs    01/30/20 0914  NA 134*  K 3.7  CL 99  CO2 27  GLUCOSE 117*  BUN 16  CREATININE 0.79  CALCIUM 9.3    Intake/Output Summary (Last 24 hours) at 02/01/2020 1437 Last data filed at 02/01/2020 1230 Gross per 24 hour  Intake 420 ml  Output --  Net 420 ml        Physical Exam: Vital Signs Blood pressure (!) 111/49, pulse 76, temperature 98.3 F (36.8 C), resp. rate 18, height 5\' 5"  (1.651 m), weight 82.6 kg, SpO2 95 %.  General: Alert and oriented x 3, No apparent distress HEENT: Head is normocephalic, atraumatic, PERRLA, EOMI, sclera anicteric, oral mucosa pink and moist, dentition intact, ext ear canals clear,  Neck: Supple without JVD or lymphadenopathy Heart: Reg rate and rhythm. No murmurs rubs or gallops Chest: CTA bilaterally without wheezes, rales, or rhonchi; no distress GI: Non-distended.  BS +. Skin: Warm and dry.  Incision with dressing CDI Psych: Normal mood.  Normal behavior. Musc: Left hip with edema and tenderness,  improving Neuro: Alert Motor: Left lower extremity: Hip flexion 2+-3 -/5, knee extension 4 -/5, ankle dorsiflexion 4+/5    Assessment/Plan: 1. Functional deficits secondary to comminuted left intertrochanteric hip fracture status post intramedullary implant which require 3+ hours per day of interdisciplinary therapy in a comprehensive inpatient rehab setting.  Physiatrist is providing close team supervision and 24 hour management of active medical problems listed below.  Physiatrist and rehab team continue to assess barriers to discharge/monitor patient progress toward functional and medical goals  Care Tool:  Bathing    Body parts bathed by patient: Right arm, Left arm, Chest, Abdomen, Front perineal area, Left upper leg, Right upper leg, Face         Bathing assist Assist Level: Moderate Assistance - Patient 50 - 74%     Upper Body Dressing/Undressing Upper body dressing   What is the patient wearing?: Pull over shirt    Upper body assist Assist Level: Minimal Assistance - Patient > 75%    Lower Body Dressing/Undressing Lower body dressing      What is the patient wearing?: Pants, Underwear/pull up     Lower body assist Assist for lower body dressing: Moderate Assistance - Patient 50 - 74%     Toileting Toileting    Toileting assist Assist for toileting: Moderate Assistance - Patient 50 - 74%     Transfers Chair/bed transfer  Transfers assist     Chair/bed transfer assist level: Contact Guard/Touching assist     Locomotion Ambulation   Ambulation assist      Assist level: Contact Guard/Touching assist Assistive device: Walker-rolling Max distance: 25   Walk 10 feet activity   Assist     Assist level: Contact Guard/Touching assist Assistive device: Walker-rolling   Walk 50 feet activity   Assist Walk  50 feet with 2 turns activity did not occur: Safety/medical concerns         Walk 150 feet activity   Assist Walk 150 feet  activity did not occur: Safety/medical concerns         Walk 10 feet on uneven surface  activity   Assist Walk 10 feet on uneven surfaces activity did not occur: Safety/medical concerns         Wheelchair     Assist Will patient use wheelchair at discharge?: No             Wheelchair 50 feet with 2 turns activity    Assist            Wheelchair 150 feet activity     Assist          Medical Problem List and Plan: 1.  Decreased functional mobility secondary to comminuted left intertrochanteric hip fracture with varus angulation.  Status post open treatment with intramedullary implant 01/24/2020.  25% partial weightbearing.  Continue CIR 2.  Antithrombotics: -DVT/anticoagulation: Lovenox.   Vascular study limited, but negative             -antiplatelet therapy: N/A 3. Pain Management: Oxycodone and Flexeril as needed  Controlled 10/2- not requiring oxy  Monitor with increased exertion 4. Mood: Provide emotional support             -antipsychotic agents: N/A 5. Neuropsych: This patient is capable of making decisions on her own behalf. 6. Skin/Wound Care: Routine skin checks 7. Fluids/Electrolytes/Nutrition: Routine in and outs.  8.  Acute blood loss anemia.               Hemoglobin 10.7 on 9/30  Continue to monitor 9.  Hypertension.  Chlorthalidone 50 mg daily, Cozaar 100 mg daily, Toprol-XL 50 mg daily.    Controlled 10/2             Monitor with increased mobility 11. Drug induced constipation.  Senokot S daily and MiraLAX daily             Improving  Adjust bowel meds as necessary 12.  Hyponatremia  Sodium 134 on 9/30, labs ordered for Monday  Continue to monitor  13.  Transaminitis  ALT mildly elevated on 9/30, labs ordered for Monday 14. Disposition: Patient would like to d/c home with husband who will provide 24/7 supervision on Tuesday morning   LOS: 3 days A FACE TO [place] 02/01/2020,  2:37 PM

## 2020-02-02 ENCOUNTER — Inpatient Hospital Stay (HOSPITAL_COMMUNITY): Payer: BC Managed Care – PPO | Admitting: Physical Therapy

## 2020-02-02 ENCOUNTER — Inpatient Hospital Stay (HOSPITAL_COMMUNITY): Payer: BC Managed Care – PPO

## 2020-02-02 MED ORDER — TRAMADOL HCL 50 MG PO TABS
50.0000 mg | ORAL_TABLET | Freq: Four times a day (QID) | ORAL | Status: DC | PRN
  Administered 2020-02-02 – 2020-02-04 (×4): 50 mg via ORAL
  Filled 2020-02-02 (×4): qty 1

## 2020-02-02 MED ORDER — TRAMADOL HCL 50 MG PO TABS
50.0000 mg | ORAL_TABLET | Freq: Four times a day (QID) | ORAL | Status: DC
Start: 2020-02-02 — End: 2020-02-02

## 2020-02-02 MED ORDER — LOSARTAN POTASSIUM 50 MG PO TABS
50.0000 mg | ORAL_TABLET | Freq: Every day | ORAL | Status: DC
  Administered 2020-02-03 – 2020-02-04 (×2): 50 mg via ORAL
  Filled 2020-02-02 (×2): qty 1

## 2020-02-02 NOTE — Progress Notes (Signed)
Occupational Therapy Session Note  Patient Details  Name: Kimberly Whitney MRN: [id number] Date of Birth: [birth year]  Today's Date: 02/02/2020 OT Individual Time: 0959-1100 OT Individual Time Calculation (min): 61 min    Short Term Goals: Week 1:  OT Short Term Goal 1 (Week 1): Pt will complete LB dressing with min assist using AE as needed. OT Short Term Goal 2 (Week 1): Pt will complete BSC transfer with CGA using LRAD. OT Short Term Goal 3 (Week 1): Pt will complete bathing using AE as needed with min assist. OT Short Term Goal 4 (Week 1): Pt will complete oral hygiene standing sinkside with CGA.  Skilled Therapeutic Interventions/Progress Updates:    Pt received sitting in recliner with no c/o pain but relaying hypotensive events of the morning and slight nausea. Provided general education re sx and often resultant hypotension, as well as encouragement/emotional support as pt was tearful. Pt provided with sprite to settle her stomach. BP 121/59 seated with knee high ted hose on. Pt stood from recliner with CGA and her BP was 92/53 with slight dizziness/nausea. Pt agreeable to try ADLs at sit <> stand from recliner with close monitoring of hypotension. Pt completed UB bathing and dressing with (S). Pt completed sit > stand with CGA and washed peri areas with min A. Min A to don pants and underwear. Following bathing BP 110/64. Pt completed ambulatory transfer to the sink with RW with CGA. Min cueing for PWB versus NWB to encourage step through pattern of gait. Pt completed oral care and hair care in standing with close (S). Pt returned to the recliner and was left sitting up with all needs met, chair alarm set.   Therapy Documentation Precautions:  Precautions Precautions: Fall Precaution Comments: Anxious with mobility Restrictions Weight Bearing Restrictions: Yes LLE Weight Bearing: Partial weight bearing LLE Partial Weight Bearing Percentage or Pounds:  (25%)   Therapy/Group:  Individual Therapy  Curtis Sites 02/02/2020, 7:26 AM

## 2020-02-02 NOTE — Progress Notes (Signed)
Physical Therapy Session Note  Patient Details  Name: Kimberly Whitney MRN: [id number] Date of Birth: 02-Feb-1951  Today's Date: 02/02/2020 PT Individual Time: 2505-3976 and 1413-1510 PT Individual Time Calculation (min): 71 min and 57 min  Short Term Goals: Week 1:  PT Short Term Goal 1 (Week 1): Pt will perform bed mobility with minA PT Short Term Goal 2 (Week 1): Pt will perform bed<>chair transfers with minA and LRAD PT Short Term Goal 3 (Week 1): Pt will ambulate at least 42ft with minA and LRAD PT Short Term Goal 4 (Week 1): Pt will negotiate up/down at least 2 steps with modA and LRAD  Skilled Therapeutic Interventions/Progress Updates:    Session 1: Pt received supine in bed, tearful, and reporting feeling "down in the dumps" about not having therapy yesterday due to feeling that her leg has gotten really "tight" - pt reports she was feeling very "encouraged" when she could see her improvement from this past Thursday to Friday. Despite this, pt agreeable to therapy session and eager to start showing more improvement. Due to reports of feeling "tight"/"stiff" started with supine L LE heel slides 2x15 reps with active assist to increase ROM and supine hip abduction/adduction x15 reps with pt demonstrating increased ease with this movement compared to flexion. Supine>sitting R EOB, HOB partially elevated and using bedrails, with close supervision. Sitting EOB donned shoes max assist. Pt reports need to use bathroom. Sit>stand EOB>RW with min assist. Gait training ~16ft into bathroom using RW with min assist for balance and cuing for AD management over bathroom door threshold to maintain L LE PWB. Standing using UE support on RW or grab bar with min assist for balance performed LB clothing management without assist - pt reports this is the 1st time she was able to do this. Continent of bladder and performed standing peri-care with same assist. Pt reports she is starting to feel lightheaded, returned to  sitting on BSC over toilet - therapist provided water and selected to perform stand pivot to w/c for safety with min assist. Pt agreeable to continue with therapy session with decrease in her symptoms. Transported to/from gym in w/c for time management and energy conservation. Gait training ~68ft using RW with min assist for balance - step-to gait pattern leading with L LE and cuing for increased hip/knee flexion during swing and heel strike on initial contact to progress towards more normalized gait mechanics. Pt again reports onset of feeling lightheaded. Vitals assessed: BP 117/63 (MAP 80), HR 91bpm attempted to get standing vitals to assess orthostatic hypotension but pt had to return to sitting prior to dynamap reading complete due to worsening lightheadedness: BP 104/61 (MAP 73), HR 92bpm Stand pivot to w/c using RW with min assist for balance - during stand pivot transfers pt often shuffles foot on ground as opposed to using UEs to lift foot and turn, educated pt on risk of skin breakdown if shuffling a lot due to shearing forces. Transported back to room and stand pivot w/c>recliner with min assist. Therapist donned B LE TED hose. Pt left seated in recliner with needs in reach, B LEs elevated, and chair alarm on. Notified Angelina, RN about pt's decreased BP and symptoms.  Session 2: Pt received sitting in recliner and reports feeling overall better compared to during AM session - pt appears to feel better. Therapist and pt discussed that per MD note pt is requesting to discharge home on Tuesday 10/5 with therapist educating pt on goals of therapy to increase  ambulation distance and perform stair navigation to be able to safely navigate in her home using RW and to perform home entry - pt verbalizes understanding but states that she feels she will do better once she is at home. Therapy session focused on these 2 goals. Sit<>stands using RW with CGA for steadying progressing towards supervision during  session. Stand pivot to w/c using RW with CGA for steadying.  Transported to/from gym in w/c for time management and energy conservation. Gait training ~79ft x2 using RW with CGA for steadying - continues to demo step-to gait pattern in order to more easily manage L LE PWB - cuing for increase L hip/knee flexion during swing phase with heel strike on initial contact to progress towards more normal gait mechanics. BP after 1st walk: BP 124/59 (MAP 79), HR 90bpm with pt denying any lightheadedness symptoms. Seated L LE heel slides x15reps with focus on increased hip/knee flexion ROM. Therapist demonstrated 2stair navigation technique using RW via ascending backwards and descending forwards - pt reports she feels that it looks unsteady and therapist educated on need for +2 assist to stabilize AD. In preparation for stairs performed backwards hop up/down on/off 4" progressed to 6" height step in // bars with CGA for steadying and cuing to maintain L LE PWB with improving compliance - after ~5 reps total pt reports UEs fatiguing. Transported back to room where her husband was present. Patient, pt's husband, and therapist discussed discharge planning with them both stating they feel strongly about patient discharging on Tuesday - educated pt's husband on her CLOF with limited gait distance and need to progress to 2 step navigation for home entry. Pt has pictures of home entrance with husband reporting there are 2 steps for either entry - garage steps are 6" high each and front door steps are 6" and 7" high. Therapist educated pt's husband on pt's ability to hop up/down on/off 6" step in // bars today. Therapist also discussed option of having pt's husband bump pt up/down steps in w/c and option of having ramp installed but that may be difficult due to time limitations - plan as of now is to continue working with pt on performing stair navigation using RW and pt/husband in agreement. Pt/husband report they have a transport  chair at home if needed and that the door widths would fit wheelchair if needed. Stand pivot w/c>recliner using RW with CGA for steadying/safety. Pt left seated in recliner with needs in reach and chair alarm on.  Therapy Documentation Precautions:  Precautions Precautions: Fall Precaution Comments: Anxious with mobility Restrictions Weight Bearing Restrictions: Yes LLE Weight Bearing: Partial weight bearing LLE Partial Weight Bearing Percentage or Pounds:  (25%)  Pain:   Session 1: Primarily complains of "tightness" in her L LE - performed AROM exercises for increased mobility.  Session 2: Grimaces with discomfort during ROM exercise but no significant verbal complaints of pain during session.   Therapy/Group: Individual Therapy  Tawana Scale , PT, DPT, CSRS  02/02/2020, 7:53 AM

## 2020-02-02 NOTE — Progress Notes (Addendum)
Per therapy pt was lightheaded during therapy bp meds given this morning; morning bp 128/51; per therapy ted hose was placed. bp was taken during therapy sitting 117/63 standing 104/61 Will continue to  monitor and will let MD know

## 2020-02-02 NOTE — Progress Notes (Signed)
[place] PHYSICAL MEDICINE & REHABILITATION PROGRESS NOTE   Subjective/Complaints: Felt lightheaded today and nauseous- cut cozaar dose in half. She still would like to get home Tuesday if she can.  Incision healing well- discussed that staples can be removed in 14 days  ROS: Denies CP, SOB, N/V/D  Objective:   No results found. No results for input(s): WBC, HGB, HCT, PLT in the last 72 hours. No results for input(s): NA, K, CL, CO2, GLUCOSE, BUN, CREATININE, CALCIUM in the last 72 hours.  Intake/Output Summary (Last 24 hours) at 02/02/2020 1318 Last data filed at 02/02/2020 0900 Gross per 24 hour  Intake 560 ml  Output 1 ml  Net 559 ml        Physical Exam: Vital Signs Blood pressure (!) 128/51, pulse 77, temperature 98.1 F (36.7 C), resp. rate 16, height 5\' 5"  (1.651 m), weight 82.6 kg, SpO2 99 %.  General: Alert and oriented x 3, No apparent distress HEENT: Head is normocephalic, atraumatic, PERRLA, EOMI, sclera anicteric, oral mucosa pink and moist, dentition intact, ext ear canals clear,  Neck: Supple without JVD or lymphadenopathy Heart: Reg rate and rhythm. No murmurs rubs or gallops Chest: CTA bilaterally without wheezes, rales, or rhonchi; no distress Abdomen: Soft, non-tender, non-distended, bowel sounds positive. Extremities: No clubbing, cyanosis, or edema. Pulses are 2+ Skin: Warm and dry.  Incision with dressing CDI Psych: Normal mood.  Normal behavior. Musc: Left hip with edema and tenderness, improving Neuro: Alert Motor: Left lower extremity: Hip flexion 2+-3 -/5, knee extension 4 -/5, ankle dorsiflexion 4+/5    Assessment/Plan: 1. Functional deficits secondary to comminuted left intertrochanteric hip fracture status post intramedullary implant which require 3+ hours per day of interdisciplinary therapy in a comprehensive inpatient rehab setting.  Physiatrist is providing close team supervision and 24 hour management of active medical problems listed  below.  Physiatrist and rehab team continue to assess barriers to discharge/monitor patient progress toward functional and medical goals  Care Tool:  Bathing    Body parts bathed by patient: Right arm, Left arm, Chest, Abdomen, Front perineal area, Left upper leg, Right upper leg, Face, Buttocks   Body parts bathed by helper: Chest, Front perineal area, Buttocks     Bathing assist Assist Level: Minimal Assistance - Patient > 75%     Upper Body Dressing/Undressing Upper body dressing   What is the patient wearing?: Pull over shirt    Upper body assist Assist Level: Supervision/Verbal cueing    Lower Body Dressing/Undressing Lower body dressing      What is the patient wearing?: Pants, Underwear/pull up     Lower body assist Assist for lower body dressing: Minimal Assistance - Patient > 75%     Toileting Toileting    Toileting assist Assist for toileting: Moderate Assistance - Patient 50 - 74%     Transfers Chair/bed transfer  Transfers assist     Chair/bed transfer assist level: Contact Guard/Touching assist     Locomotion Ambulation   Ambulation assist      Assist level: Contact Guard/Touching assist Assistive device: Walker-rolling Max distance: 25   Walk 10 feet activity   Assist     Assist level: Contact Guard/Touching assist Assistive device: Walker-rolling   Walk 50 feet activity   Assist Walk 50 feet with 2 turns activity did not occur: Safety/medical concerns         Walk 150 feet activity   Assist Walk 150 feet activity did not occur: Safety/medical concerns  Walk 10 feet on uneven surface  activity   Assist Walk 10 feet on uneven surfaces activity did not occur: Safety/medical concerns         Wheelchair     Assist Will patient use wheelchair at discharge?: No             Wheelchair 50 feet with 2 turns activity    Assist            Wheelchair 150 feet activity     Assist           Medical Problem List and Plan: 1.  Decreased functional mobility secondary to comminuted left intertrochanteric hip fracture with varus angulation.  Status post open treatment with intramedullary implant 01/24/2020.  25% partial weightbearing.  Continue CIR 2.  Antithrombotics: -DVT/anticoagulation: Lovenox.   Vascular study limited, but negative             -antiplatelet therapy: N/A 3. Pain Management: Oxycodone and Flexeril as needed  10/3: oxy makes her nausea- ordered tramadol instead  Monitor with increased exertion 4. Mood: Provide emotional support             -antipsychotic agents: N/A 5. Neuropsych: This patient is capable of making decisions on her own behalf. 6. Skin/Wound Care: Routine skin checks 7. Fluids/Electrolytes/Nutrition: Routine in and outs.  8.  Acute blood loss anemia.               Hemoglobin 10.7 on 9/30  Continue to monitor 9.  Hypertension.  Chlorthalidone 50 mg daily, Cozaar 100 mg daily, Toprol-XL 50 mg daily.    Hypotensive: decrease Cozaar to 50mg .              Monitor with increased mobility 11. Drug induced constipation.  Senokot S daily and MiraLAX daily             Improving  Adjust bowel meds as necessary 12.  Hyponatremia  Sodium 134 on 9/30, labs ordered for Monday  Continue to monitor  13.  Transaminitis  ALT mildly elevated on 9/30, labs ordered for Monday 14. Disposition: Patient would like to d/c home with husband who will provide 24/7 supervision on Tuesday morning   LOS: 4 days A FACE TO [place] 02/02/2020, 1:18 PM

## 2020-02-03 ENCOUNTER — Inpatient Hospital Stay (HOSPITAL_COMMUNITY): Payer: BC Managed Care – PPO

## 2020-02-03 ENCOUNTER — Inpatient Hospital Stay (HOSPITAL_COMMUNITY): Payer: BC Managed Care – PPO | Admitting: Occupational Therapy

## 2020-02-03 DIAGNOSIS — E876 Hypokalemia: Secondary | ICD-10-CM

## 2020-02-03 DIAGNOSIS — I952 Hypotension due to drugs: Secondary | ICD-10-CM

## 2020-02-03 LAB — COMPREHENSIVE METABOLIC PANEL
ALT: 41 U/L (ref 0–44)
AST: 30 U/L (ref 15–41)
Albumin: 3 g/dL — ABNORMAL LOW (ref 3.5–5.0)
Alkaline Phosphatase: 96 U/L (ref 38–126)
Anion gap: 7 (ref 5–15)
BUN: 9 mg/dL (ref 8–23)
CO2: 26 mmol/L (ref 22–32)
Calcium: 9 mg/dL (ref 8.9–10.3)
Chloride: 96 mmol/L — ABNORMAL LOW (ref 98–111)
Creatinine, Ser: 0.69 mg/dL (ref 0.44–1.00)
GFR calc Af Amer: >60 mL/min (ref >60)
GFR calc non Af Amer: >60 mL/min (ref >60)
Glucose, Bld: 102 mg/dL — ABNORMAL HIGH (ref 70–99)
Potassium: 3.3 mmol/L — ABNORMAL LOW (ref 3.5–5.1)
Sodium: 129 mmol/L — ABNORMAL LOW (ref 135–145)
Total Bilirubin: 0.9 mg/dL (ref 0.3–1.2)
Total Protein: 5.6 g/dL — ABNORMAL LOW (ref 6.5–8.1)

## 2020-02-03 MED ORDER — TRAMADOL HCL 50 MG PO TABS
50.0000 mg | ORAL_TABLET | Freq: Four times a day (QID) | ORAL | 0 refills | Status: DC | PRN
Start: 2020-02-03 — End: 2020-02-04

## 2020-02-03 MED ORDER — POTASSIUM CHLORIDE CRYS ER 20 MEQ PO TBCR
40.0000 meq | EXTENDED_RELEASE_TABLET | Freq: Three times a day (TID) | ORAL | Status: AC
  Administered 2020-02-03 (×2): 40 meq via ORAL
  Filled 2020-02-03 (×2): qty 2

## 2020-02-03 MED ORDER — CYCLOBENZAPRINE HCL 5 MG PO TABS: 5.0000 mg | ORAL_TABLET | Freq: Three times a day (TID) | ORAL | 0 refills | Status: DC | PRN

## 2020-02-03 MED ORDER — ACETAMINOPHEN 325 MG PO TABS: 650.0000 mg | ORAL_TABLET | Freq: Four times a day (QID) | ORAL | Status: DC | PRN

## 2020-02-03 MED ORDER — POLYETHYLENE GLYCOL 3350 17 G PO PACK: 17.0000 g | PACK | Freq: Every day | ORAL | 0 refills | Status: DC

## 2020-02-03 MED ORDER — METOPROLOL SUCCINATE ER 50 MG PO TB24: 50.0000 mg | ORAL_TABLET | Freq: Every evening | ORAL | 0 refills | Status: DC

## 2020-02-03 MED ORDER — DOCUSATE SODIUM 100 MG PO CAPS: 100.0000 mg | ORAL_CAPSULE | Freq: Two times a day (BID) | ORAL | 0 refills | Status: DC

## 2020-02-03 MED ORDER — CHLORTHALIDONE 50 MG PO TABS: 50.0000 mg | ORAL_TABLET | Freq: Every day | ORAL | 0 refills | Status: DC

## 2020-02-03 NOTE — Progress Notes (Signed)
Patient ID: Kimberly Whitney, female   DOB: Sep 11, 1950, 69 y.o.   MRN: [id number]  Therapy team feels pt ready for discharge tomrorow and MD feels medically stable plan for discharge tomorrow. DME should be delivered today to room.

## 2020-02-03 NOTE — Progress Notes (Signed)
Occupational Therapy Session Note  Patient Details  Name: Kimberly Whitney MRN: [id number] Date of Birth: [birth year]  Today's Date: 02/03/2020 OT Individual Time: 1031-5945 OT Individual Time Calculation (min): 62 min    Short Term Goals: Week 1:  OT Short Term Goal 1 (Week 1): Pt will complete LB dressing with min assist using AE as needed. OT Short Term Goal 2 (Week 1): Pt will complete BSC transfer with CGA using LRAD. OT Short Term Goal 3 (Week 1): Pt will complete bathing using AE as needed with min assist. OT Short Term Goal 4 (Week 1): Pt will complete oral hygiene standing sinkside with CGA.  Skilled Therapeutic Interventions/Progress Updates:    Patient in bed, alert and ready for therapy session.   She notes that pain is under control at this time.  Husband present for therapy session.  Supine from flat bed surface without rails to sitting edge of bed with CS, min cues.  Sit to stand and ambulation with RW to/from bed, toilet, shower bench, arm chair with CGA.  She is able to complete toileting with CGA.  Shower completed with min A for bilateral lower legs (no long handled sponge available).  Dressing:  UB set up, LB CS using reacher, CM in stance with CGA.  teds dependent, socks mod I with sock aide, shoes max A.  Grooming tasks set up seated at sink.  She remained in the wc at close of session with nursing providing medications.    Therapy Documentation Precautions:  Precautions Precautions: Fall Precaution Comments: Anxious with mobility Restrictions Weight Bearing Restrictions: Yes LLE Weight Bearing: Partial weight bearing LLE Partial Weight Bearing Percentage or Pounds:  (25%)   Therapy/Group: Individual Therapy  Carlos Levering 02/03/2020, 7:34 AM

## 2020-02-03 NOTE — Progress Notes (Signed)
Occupational Therapy Discharge Summary  Patient Details  Name: Kimberly Whitney MRN: [id number] Date of Birth: April 10, 1951  Today's Date: 02/03/2020 OT Individual Time: 9147-8295 OT Individual Time Calculation (min): 58 min    Patient has met 7 of 10 long term goals due to improved activity tolerance, improved balance, ability to compensate for deficits and improved coordination.  Patient to discharge at overall min Assist to Supervision level.  Patient's care partner is independent to provide the necessary physical and cognitive assistance at discharge.    Reasons goals not met: Patient limited by pain in LLE.  Recommendation:  Patient will benefit from ongoing skilled OT services in home health setting to continue to advance functional skills in the area of BADL and IADL.  Equipment: tub transfer bench and BSC  Reasons for discharge: treatment goals met and discharge from hospital  Patient/family agrees with progress made and goals achieved: Yes   Skilled Intervention:  Pt sitting up in w/c, c/o 7/10 pain with movement of LLE, pts husband present throughout session.  Nurse made aware of pts pain level and location and prescribed medication administered mid session.  Discussed discharge planning with pt and husband including pts current functional transfer and self care status as well as safety during toileting at night.  Pts husband reports he will be available to provide needed assist to ensure safe toileting at night.  Educated pt and husband on TTB placement and transfer technique.  Pt return demonstrated transfer with min assist for LLE support due to pain level.  Pts husband reported good understanding of assistance technique. Reviewed recommendations for AE to increase independence and safety during LB bathing and dressing and pt reports good understanding of how to purchase.  Pt requesting cold pack, OT placed on left knee and educated pt on precautions with use of ice and duration of no  more than 20 minutes per hour.  Pt reports good understanding.  Call bell in reach.    OT Discharge Precautions/Restrictions  Precautions Precautions: Fall Precaution Comments: Anxious with mobility Restrictions Weight Bearing Restrictions: Yes LLE Weight Bearing: Partial weight bearing LLE Partial Weight Bearing Percentage or Pounds: 25% Pain Pain Assessment Pain Scale: 0-10 Pain Score: 7  Pain Type: Surgical pain Pain Location: Knee Pain Orientation: Left Pain Descriptors / Indicators: Tightness Pain Frequency: Intermittent Pain Onset: With Activity Patients Stated Pain Goal: 2 Pain Intervention(s): Repositioned;Rest;RN made aware Multiple Pain Sites: No ADL ADL Eating: Independent Where Assessed-Eating: Chair Grooming: Setup Where Assessed-Grooming: Sitting at sink Upper Body Bathing: Minimal assistance Where Assessed-Upper Body Bathing: Sitting at sink Lower Body Bathing: Maximal assistance Where Assessed-Lower Body Bathing: Sitting at sink, Standing at sink Upper Body Dressing: Minimal assistance Where Assessed-Upper Body Dressing: Sitting at sink Lower Body Dressing: Maximal assistance Where Assessed-Lower Body Dressing: Sitting at sink, Standing at sink Toileting: Minimal assistance Tub/Shower Transfer: Minimal assistance Tub/Shower Transfer Method: Ambulating Tub/Shower Equipment: Transfer tub bench Vision Baseline Vision/History: Wears glasses Wears Glasses: At all times Patient Visual Report: No change from baseline Vision Assessment?: No apparent visual deficits Perception  Perception: Within Functional Limits Praxis Praxis: Intact Cognition Overall Cognitive Status: Within Functional Limits for tasks assessed Arousal/Alertness: Awake/alert Orientation Level: Oriented X4 Attention: Focused;Sustained Focused Attention: Appears intact Sustained Attention: Appears intact Memory: Appears intact Awareness: Appears intact Problem Solving: Appears  intact Problem Solving Impairment: Functional basic;Verbal basic Safety/Judgment: Appears intact Sensation Sensation Light Touch: Appears Intact Hot/Cold: Appears Intact Proprioception: Appears Intact Coordination Gross Motor Movements are Fluid and Coordinated: No Fine Motor Movements  are Fluid and Coordinated: Yes Coordination and Movement Description: LLE pain limiting, anxious with mobility Motor  Motor Motor: Within Functional Limits Motor - Skilled Clinical Observations: pain limiting with L hip fx and s/p LMN. Effortful movement however much improved since evaluation Mobility  Bed Mobility Bed Mobility: Rolling Right;Rolling Left;Supine to Sit;Sit to Supine Rolling Right: Supervision/verbal cueing Rolling Left: Supervision/Verbal cueing Supine to Sit: Supervision/Verbal cueing Sit to Supine: Supervision/Verbal cueing Transfers Sit to Stand: Supervision/Verbal cueing Stand to Sit: Supervision/Verbal cueing  Trunk/Postural Assessment  Cervical Assessment Cervical Assessment: Within Functional Limits Thoracic Assessment Thoracic Assessment: Within Functional Limits Lumbar Assessment Lumbar Assessment: Within Functional Limits Postural Control Postural Control: Within Functional Limits  Balance Balance Balance Assessed: Yes Static Sitting Balance Static Sitting - Balance Support: Feet supported;No upper extremity supported Static Sitting - Level of Assistance: 7: Independent Dynamic Sitting Balance Sitting balance - Comments: Reliant on LUE support to offload L hip for pain management Static Standing Balance Static Standing - Balance Support: During functional activity;Bilateral upper extremity supported Static Standing - Level of Assistance: 5: Stand by assistance Dynamic Standing Balance Dynamic Standing - Balance Support: During functional activity;Bilateral upper extremity supported Dynamic Standing - Level of Assistance: 5: Stand by assistance Extremity/Trunk  Assessment RUE Assessment RUE Assessment: Within Functional Limits General Strength Comments: Grossly 5/5 LUE Assessment LUE Assessment: Within Functional Limits General Strength Comments: Grossly 5/5   Ammie Warrick L Lene Mckay 02/03/2020, 4:16 PM

## 2020-02-03 NOTE — Plan of Care (Signed)
  Problem: Consults Goal: RH GENERAL PATIENT EDUCATION Description: See Patient Education module for education specifics. Outcome: Progressing   Problem: RH SKIN INTEGRITY Goal: RH STG ABLE TO PERFORM INCISION/WOUND CARE W/ASSISTANCE Description: STG Able To Perform Incision/Wound Care With  Mod Assistance. Outcome: Progressing   Problem: RH SAFETY Goal: RH STG ADHERE TO SAFETY PRECAUTIONS W/ASSISTANCE/DEVICE Description: STG Adhere to Safety Precautions With mod I Assistance/Device. Outcome: Progressing   Problem: RH PAIN MANAGEMENT Goal: RH STG PAIN MANAGED AT OR BELOW PT'S PAIN GOAL Description: Pain less than 3 Outcome: Progressing   Problem: RH KNOWLEDGE DEFICIT GENERAL Goal: RH STG INCREASE KNOWLEDGE OF SELF CARE AFTER HOSPITALIZATION Description: Patient or family will be able to verbalize safety plan and incision care with cues or handout Outcome: Progressing

## 2020-02-03 NOTE — Progress Notes (Signed)
Patient ID: Kimberly Whitney, female   DOB: 1950-05-21, 69 y.o.   MRN: [id number]  Met withy husband and pt who expressed she wants to discharge tomorrow or Wed at the latest. She feels she can do well at home. Her husband is here and have asked PT to begin family education. Will work on DME needs. Made both aware her insurance will need an update and will not necessarily cut off tomorrow. Pt has had BP issues over the weekend and feels better today. Will work on plans and see how education goes with husband.

## 2020-02-03 NOTE — Progress Notes (Signed)
Patient ID: Kimberly Whitney, female   DOB: [birth year], 69 y.o.   MRN: [id number]     Diagnosis codes: S72.142A & E87.1  Height:  5'5              Weight:   182 lbs         Patient suffers from Left Femur fracture   which impairs their ability to perform daily activities like ambulation and ADl's  in the home.  A walker  will not resolve issue with performing activities of daily living.  A wheelchair will allow patient to safely perform daily activities.  Patient is not able to propel themselves in the home using a standard weight wheelchair due to endurance and fatigue .  Patient can self propel in the lightweight wheelchair. Length of need 6-9 months

## 2020-02-03 NOTE — Discharge Summary (Addendum)
Physician Discharge Summary  Patient ID: Kimberly Whitney MRN: [id number] DOB/AGE: [birth year] 69 y.o.  Admit date: 01/29/2020 Discharge date: 02/04/2020  Discharge Diagnoses:  Principal Problem:   Intertrochanteric fracture of left hip (HCC) Active Problems:   Hyponatremia   Transaminitis   Hypokalemia   Drug-induced hypotension DVT prophylaxis Acute blood loss anemia Hypertension Drug-induced constipation Tobacco use  Discharged Condition: Stable  Significant Diagnostic Studies: DG Chest 1 View  Result Date: 01/23/2020 CLINICAL DATA:  [place], hip fracture, preoperative evaluation EXAM: CHEST  1 VIEW COMPARISON:  08/29/2015 FINDINGS: The heart size and mediastinal contours are within normal limits. Both lungs are clear. The visualized skeletal structures are unremarkable. IMPRESSION: No active disease. Electronically Signed   By: Randa Ngo M.D.   On: 01/23/2020 16:56   DG C-Arm 1-60 Min  Result Date: 01/24/2020 CLINICAL DATA:  Known left hip fracture EXAM: DG HIP (WITH OR WITHOUT PELVIS) 2-3V LEFT; DG C-ARM 1-60 MIN COMPARISON:  Film from earlier in the same day. FLUOROSCOPY TIME:  Fluoroscopy Time:  1 minutes 50 seconds Radiation Exposure Index (if provided by the fluoroscopic device): 8.55 mGy Number of Acquired Spot Images: 8 FINDINGS: Initial images again demonstrate the proximal left femoral fracture. Fracture fragments have been nearly completely reduced. Medullary rod was then placed with 2 fixation screws proximally and 2 fixation screws distally. Fracture fragments are in near anatomic alignment. IMPRESSION: Status post ORIF of proximal left femoral fracture. Electronically Signed   By: Inez Catalina M.D.   On: 01/24/2020 00:55   DG HIP UNILAT WITH PELVIS 2-3 VIEWS LEFT  Result Date: 01/24/2020 CLINICAL DATA:  Known left hip fracture EXAM: DG HIP (WITH OR WITHOUT PELVIS) 2-3V LEFT; DG C-ARM 1-60 MIN COMPARISON:  Film from earlier in the same day. FLUOROSCOPY TIME:  Fluoroscopy  Time:  1 minutes 50 seconds Radiation Exposure Index (if provided by the fluoroscopic device): 8.55 mGy Number of Acquired Spot Images: 8 FINDINGS: Initial images again demonstrate the proximal left femoral fracture. Fracture fragments have been nearly completely reduced. Medullary rod was then placed with 2 fixation screws proximally and 2 fixation screws distally. Fracture fragments are in near anatomic alignment. IMPRESSION: Status post ORIF of proximal left femoral fracture. Electronically Signed   By: Inez Catalina M.D.   On: 01/24/2020 00:55   DG Hip Unilat With Pelvis 2-3 Views Left  Result Date: 01/23/2020 CLINICAL DATA:  [place] off stool EXAM: DG HIP (WITH OR WITHOUT PELVIS) 2-3V LEFT COMPARISON:  None. FINDINGS: Frontal view of the pelvis as well as frontal and frogleg lateral views of the left hip are obtained. There is a comminuted intertrochanteric left hip fracture with varus angulation at the fracture site. No dislocation. Remainder of the bony pelvis is unremarkable. The right hip is well aligned. IMPRESSION: 1. Comminuted inter trochanteric left hip fracture with varus angulation. Electronically Signed   By: Randa Ngo M.D.   On: 01/23/2020 16:51   VAS [place] LOWER EXTREMITY VENOUS (DVT)  Result Date: 01/30/2020  Lower Venous DVTStudy Indications: Swelling.  Risk Factors: Immobility Surgery 01-23-2020 Intramedullary nail intertrochantric (LT hip). Limitations: Poor ultrasound/tissue interface. Comparison Study: No prior studies. Performing Technologist: Darlin Coco  Examination Guidelines: A complete evaluation includes B-mode imaging, spectral Doppler, color Doppler, and power Doppler as needed of all accessible portions of each vessel. Bilateral testing is considered an integral part of a complete examination. Limited examinations for reoccurring indications may be performed as noted. The reflux portion of the exam is performed with the patient in  reverse Trendelenburg.   +---------+---------------+---------+-----------+----------+--------------+ RIGHT    CompressibilityPhasicitySpontaneityPropertiesThrombus Aging +---------+---------------+---------+-----------+----------+--------------+ CFV      Full           Yes      Yes                                 +---------+---------------+---------+-----------+----------+--------------+ SFJ      Full                                                        +---------+---------------+---------+-----------+----------+--------------+ FV Prox  Full                                                        +---------+---------------+---------+-----------+----------+--------------+ FV Mid   Full                                                        +---------+---------------+---------+-----------+----------+--------------+ FV DistalFull                                                        +---------+---------------+---------+-----------+----------+--------------+ PFV      Full                                                        +---------+---------------+---------+-----------+----------+--------------+ POP      Full           Yes      Yes                                 +---------+---------------+---------+-----------+----------+--------------+ PTV      Full                                                        +---------+---------------+---------+-----------+----------+--------------+ PERO     Full                                                        +---------+---------------+---------+-----------+----------+--------------+   +---------+---------------+---------+-----------+----------+--------------+ LEFT     CompressibilityPhasicitySpontaneityPropertiesThrombus Aging +---------+---------------+---------+-----------+----------+--------------+ CFV      Full           Yes      Yes                                  +---------+---------------+---------+-----------+----------+--------------+  SFJ      Full                                                        +---------+---------------+---------+-----------+----------+--------------+ FV Prox  Full                                                        +---------+---------------+---------+-----------+----------+--------------+ FV Mid   Full                                                        +---------+---------------+---------+-----------+----------+--------------+ FV Distal               Yes      Yes                                 +---------+---------------+---------+-----------+----------+--------------+ PFV      Full                                                        +---------+---------------+---------+-----------+----------+--------------+ POP      Full           Yes      Yes                                 +---------+---------------+---------+-----------+----------+--------------+ PTV      Full                                                        +---------+---------------+---------+-----------+----------+--------------+ PERO     Full                                                        +---------+---------------+---------+-----------+----------+--------------+     Summary: RIGHT: - There is no evidence of deep vein thrombosis in the lower extremity.  - No cystic structure found in the popliteal fossa.  LEFT: - There is no evidence of deep vein thrombosis in the lower extremity. However, portions of this examination were limited- see technologist comments above.  - No cystic structure found in the popliteal fossa.  *See table(s) above for measurements and observations. Electronically signed by Monica Martinez MD on 01/30/2020 at 5:06:16 PM.    Final     Labs:  Basic Metabolic Panel: Recent Labs  Lab 01/30/20 0914 02/03/20 0614  NA 134* 129*  K 3.7 3.3*  CL  99 96*  CO2 27 26  GLUCOSE 117* 102*   BUN 16 9  CREATININE 0.79 0.69  CALCIUM 9.3 9.0    CBC: Recent Labs  Lab 01/30/20 0914  WBC 8.0  NEUTROABS 5.3  HGB 10.7*  HCT 32.9*  MCV 94.8  PLT 346    CBG: No results for input(s): GLUCAP in the last 168 hours.  Family history.  Positive hypertension hyperlipidemia.  Denies any diabetes mellitus colon cancer or esophageal cancer  Brief HPI:   Kimberly Whitney is a 69 y.o. right-handed female with history of hypertension, left hip fracture 30 years ago as well as tobacco use.  Patient lives with spouse independent prior to admission.  1 level home 2 steps to entry.  Presented 01/23/2020 after mechanical fall when she was in the kitchen getting on a stool ladder and missed a step.  No loss of conscious.  Admission chemistry sodium 133 potassium 3.0 glucose 157 WBC 18,000 CK of 127.  Patient sustained left comminuted intertrochanteric hip fracture with varus angulation.  Underwent open treatment intramedullary implant 01/24/2020 per Dr.Xu.  25% partial weightbearing.  Maintained on Lovenox for DVT prophylaxis.  Acute blood loss anemia 10.8.  Therapy evaluations completed and patient was admitted for a comprehensive rehab program.   Hospital Course: Kimberly Whitney was admitted to rehab 01/29/2020 for inpatient therapies to consist of PT, ST and OT at least three hours five days a week. Past admission physiatrist, therapy team and rehab RN have worked together to provide customized collaborative inpatient rehab.  Pertaining to patient's left intertrochanteric comminuted hip fracture of varus angulation status post intramedullary implant 01/24/2019 2125% partial weightbearing.  Neurovascular sensation intact.  Patient would follow-up orthopedic services.  Lovenox for DVT prophylaxis venous Doppler studies negative.  No bleeding episodes.  Pain managed with use of Flexeril as well as tramadol.  She was having some nausea with oxycodone.  Blood pressures controlled with chlorthalidone as well as  Cozaar and Toprol.  She would follow-up with primary MD.  Bouts of constipation resolved with laxative assistance.  She did have a history of tobacco use receiving counsel regards to cessation of nicotine products.   Blood pressures were monitored on TID basis and controlled      Rehab course: During patient's stay in rehab weekly team conferences were held to monitor patient's progress, set goals and discuss barriers to discharge. At admission, patient required minimal assist stand pivot transfers moderate assist sit to supine.  Minimal assist upper body bathing max is lower body bathing minimal assist upper body dressing total assist lower body dressing  Physical exam.  Blood pressure 136/53 pulse 78 temperature 98.3 respirations 17 oxygen saturation 98% room air Constitutional.  No acute distress HEENT Head.  Normocephalic and atraumatic Eyes.  Pupils round and reactive to light no discharge without nystagmus Neck.  Supple nontender no JVD without thyromegaly Cardiac regular rate rhythm without any extra sounds or murmur heard Abdomen.  Soft nontender positive bowel sounds without rebound Respiratory effort normal no respiratory distress without wheeze Skin.  Incision site clean and dry Neurological.  Alert oriented x3 Motor.  Bilateral upper extremities 4+/5 proximal distal Right lower extremity 4+ proximal distal Left lower extremity hip flexion 2/5 knee extension 3/5 ankle dorsiflexion 4+-5 Sensation intact  /She  has had improvement in activity tolerance, balance, postural control as well as ability to compensate for deficits. Kimberly Whitney has had improvement in functional use RUE/LUE  and RLE/LLE as well as improvement in awareness.  Ambulates  15-23 feet rolling walker maintaining weightbearing precautions.  Sit to stand edge of bed rolling walker minimal assist.  Stand pivot to wheelchair using rolling walker.  Therapist demonstrated patient and husband bump up and down stairs.  Supine  from flat bed surface without rails to sitting with contact-guard assist.  Sit to stand ambulation rolling walker to and from bed toilet shower bench arm chair with contact-guard assist.  Shower completed with minimal assist.  Dressing upper body set up lower body contact-guard.  Family teaching completed discharge to home       Disposition: Discharged home    Diet: Regular  Special Instructions: No driving smoking or alcohol 25% partial weightbearing left lower extremity  Medications at discharge 1.  Tylenol as needed 2.  Chlorthalidone 50 mg daily 3.  Flexeril 5 mg 3 times daily as needed 4.  Colace 100 mg p.o. twice daily 5.  Claritin 10 mg p.o. daily 6.  Cozaar 100 mg p.o. daily 7.  Toprol-XL 50 mg p.o. every evening 8.  MiraLAX daily hold for loose stools 9.  Tramadol 50 mg every 6 hours as needed severe pain  30-35 minutes were spent completing discharge summary and discharge planning  Discharge Instructions     Ambulatory referral to Physical Medicine Rehab   Complete by: As directed    Moderate complexity follow-up 1 month left intertrochanteric hip fracture      Allergies as of 02/04/2020       Reactions   Lisinopril Cough   Lidocaine Rash   Lidocaine patch        Medication List     STOP taking these medications    enoxaparin 40 MG/0.4ML injection Commonly known as: LOVENOX   oxyCODONE-acetaminophen 5-325 MG tablet Commonly known as: Percocet       TAKE these medications    acetaminophen 325 MG tablet Commonly known as: TYLENOL Take 2 tablets (650 mg total) by mouth every 6 (six) hours as needed for mild pain (or Fever >/= 101).   chlorthalidone 50 MG tablet Commonly known as: HYGROTON Take 1 tablet (50 mg total) by mouth daily.   cyclobenzaprine 5 MG tablet Commonly known as: FLEXERIL Take 1 tablet (5 mg total) by mouth 3 (three) times daily as needed for muscle spasms.   docusate sodium 100 MG capsule Commonly known as:  COLACE Take 1 capsule (100 mg total) by mouth 2 (two) times daily.   losartan 100 MG tablet Commonly known as: COZAAR Take 100 mg by mouth daily.   magnesium 30 MG tablet Take 30 mg by mouth daily.   metoprolol succinate 50 MG 24 hr tablet Commonly known as: TOPROL-XL Take 1 tablet (50 mg total) by mouth every evening.   polyethylene glycol 17 g packet Commonly known as: MIRALAX / GLYCOLAX Take 17 g by mouth daily.   traMADol 50 MG tablet Commonly known as: ULTRAM Take 1 tablet (50 mg total) by mouth every 6 (six) hours as needed for severe pain.   ZyrTEC Allergy 10 MG tablet Generic drug: cetirizine Take 10 mg by mouth at bedtime.        Follow-up Information     Jamse Arn, MD Follow up.   Specialty: Physical Medicine and Rehabilitation Why: Only as directed Contact information: [address] [place] [address] [phone]         Leandrew Koyanagi, MD Follow up.   Specialty: Orthopedic Surgery Why: Call for appointment Contact information: [place]  [address] [phone]                 Signed: Lavon Paganini [place] 02/04/2020, 5:17 AM Patient was seen, face-face, and physical exam performed by me on day of discharge, greater than 30 minutes of total time spent.. Please see progress note from day of discharge as well.  Delice Lesch, MD, ABPMR

## 2020-02-03 NOTE — Discharge Summary (Signed)
Physical Therapy Discharge Summary  Patient Details  Name: Kimberly Whitney MRN: [id number] Date of Birth: 06/08/1950  Today's Date: 02/03/2020 PT Individual Time: 1000-1100 PT Individual Time Calculation (min): 60 min    Patient has met 8 of 9 long term goals due to improved activity tolerance, improved balance, increased strength, increased range of motion, decreased pain and improved coordination.  Patient to discharge at an ambulatory level Supervision.   Patient's care partner is independent to provide the necessary physical assistance at discharge.  Reasons goals not met: Pt required minA for bed mobility due to L hip pain and needing assist for offloading. Otherwise, treatment goals have been met.   Recommendation:  Patient will benefit from ongoing skilled PT services in home health setting to continue to advance safe functional mobility, address ongoing impairments in LLE weakness, impaired activity tolerance, gait deficits, and balance impairments in order to minimize fall risk.  Equipment: RW  Reasons for discharge: treatment goals met  Patient/family agrees with progress made and goals achieved: Yes  PT Discharge Precautions/Restrictions Precautions Precautions: Fall Precaution Comments: Anxious with mobility Restrictions Weight Bearing Restrictions: Yes LLE Weight Bearing: Partial weight bearing LLE Partial Weight Bearing Percentage or Pounds: 25% Pain Pain Assessment Pain Scale: 0-10 Pain Score: 2  Pain Type: Surgical pain Pain Location: Hip Pain Orientation: Left Vision/Perception  Perception Perception: Within Functional Limits Praxis Praxis: Intact  Cognition Overall Cognitive Status: Within Functional Limits for tasks assessed Arousal/Alertness: Awake/alert Orientation Level: Oriented X4 Attention: Focused;Sustained Focused Attention: Appears intact Sustained Attention: Appears intact Memory: Appears intact Awareness: Appears intact Problem Solving:  Appears intact Problem Solving Impairment: Functional basic;Verbal basic Safety/Judgment: Appears intact Sensation Sensation Light Touch: Appears Intact Hot/Cold: Appears Intact Proprioception: Appears Intact Coordination Gross Motor Movements are Fluid and Coordinated: No Fine Motor Movements are Fluid and Coordinated: Yes Coordination and Movement Description: LLE pain limiting, anxious with mobility Motor  Motor Motor - Skilled Clinical Observations: pain limiting with L hip fx and s/p LMN. Effortful movement however much improved since evaluation  Mobility Bed Mobility Bed Mobility: Rolling Right;Rolling Left;Supine to Sit;Sit to Supine Rolling Right: Supervision/verbal cueing Rolling Left: Supervision/Verbal cueing Supine to Sit: Supervision/Verbal cueing Sit to Supine: minA for LLE management Transfers Transfers: Sit to Stand;Stand to Sit;Stand Pivot Transfers Sit to Stand: Supervision/Verbal cueing Stand to Sit: Supervision/Verbal cueing Stand Pivot Transfers: Supervision/Verbal cueing Stand Pivot Transfer Details: Verbal cues for gait pattern;Verbal cues for safe use of DME/AE;Verbal cues for precautions/safety Transfer (Assistive device): Rolling walker Locomotion  Gait Ambulation: Yes Gait Assistance: Supervision/Verbal cueing Gait Distance (Feet): 150 Feet Assistive device: Rolling walker Gait Assistance Details: Verbal cues for gait pattern;Verbal cues for safe use of DME/AE;Verbal cues for technique;Verbal cues for precautions/safety;Verbal cues for sequencing Gait Gait: Yes Gait Pattern: Impaired Gait Pattern: Step-to pattern;Decreased step length - right;Decreased stance time - left;Decreased hip/knee flexion - left;Decreased weight shift to left Gait velocity: Decreased. Stairs / Additional Locomotion Stairs: Yes Stairs Assistance: Contact Guard/Touching assist Stair Management Technique: With walker Number of Stairs: 4 Height of Stairs: 6 Wheelchair  Mobility Wheelchair Mobility: No  Trunk/Postural Assessment  Cervical Assessment Cervical Assessment: Exceptions to [facility] (mild forward head) Thoracic Assessment Thoracic Assessment: Exceptions to [facility] (mild kyphosis) Lumbar Assessment Lumbar Assessment: Exceptions to [facility] (mild posterior pelvic tilt) Postural Control Postural Control: Within Functional Limits  Balance Balance Balance Assessed: Yes Static Sitting Balance Static Sitting - Balance Support: Feet supported;No upper extremity supported Static Sitting - Level of Assistance: 7: Independent Static Standing Balance Static Standing -  Balance Support: During functional activity;Bilateral upper extremity supported Static Standing - Level of Assistance: 5: Stand by assistance Dynamic Standing Balance Dynamic Standing - Balance Support: During functional activity;Bilateral upper extremity supported Dynamic Standing - Level of Assistance: 5: Stand by assistance Extremity Assessment  RUE Assessment RUE Assessment: Within Functional Limits General Strength Comments: Grossly 5/5 LUE Assessment LUE Assessment: Within Functional Limits General Strength Comments: Grossly 5/5 RLE Assessment RLE Assessment: Within Functional Limits General Strength Comments: Grossly 5/5 LLE Assessment LLE Assessment: Exceptions to [facility] General Strength Comments: Pain limiting. Ankle 5/5, knee ext 4/5, hip flex 2+/5  Skilled intervention: Pt received sitting in w/c, husband at bedside, pt agreeable to PT session. Focus of session to discuss and prepare for safe DC planning at pt is requesting to return home as soon as she can. Lengthy discussion held regarding home safety training, energy conservation strategies, fall prevention strategies, and various adaptations to her home for improved functional mobility. Husband present during entire session for hands on family ed. Pt wheeled in her w/c with totalA for time management from her room to main therapy  hallway. She ambulated ~78f with supervision and RW, demonstrating appropriate WB precautions (25%) in LLE. After seated rest, therapist demonstrated appropriate technique for ascending/descending steps with RW by going up facing backwards and going down facing forwards. She demonstrated ability to navigate up/down x4, 6inch steps, with CGA and RW with therapist bracing RW during ascent/descent and cues for R foot leading ascent and L foot leading descent. Pt without LOB or knee buckling during navigation. After a seated rest break, she then went up/down x4 steps again with her husband taking lead and therapist providing close supervision and intermittent CGA for safety. Husband showed great safety awareness, as well did the patient and both felt comfortable with this. She then performed car transfer with CGA and RW, increased difficulty with getting in the car due to limited L knee flexion however discussed that her car will be easier due to the ability to move seat backwards and the lower threshold entrance. WC transport back to her room for time management where she remained seated in w/c with needs in reach, husband at bedside. Both verbalized understanding of DC planning and were involved in decision making.   Alannah Averhart P Kely Dohn PT 02/03/2020, 12:25 PM

## 2020-02-03 NOTE — Progress Notes (Signed)
Physical Therapy Session Note  Patient Details  Name: Kimberly Whitney MRN: [id number] Date of Birth: 1950/10/20  Today's Date: 02/03/2020 PT Individual Time: 1300-1327 PT Individual Time Calculation (min): 27 min   Short Term Goals: Week 1:  PT Short Term Goal 1 (Week 1): Pt will perform bed mobility with minA PT Short Term Goal 2 (Week 1): Pt will perform bed<>chair transfers with minA and LRAD PT Short Term Goal 3 (Week 1): Pt will ambulate at least 40ft with minA and LRAD PT Short Term Goal 4 (Week 1): Pt will negotiate up/down at least 2 steps with modA and LRAD  Skilled Therapeutic Interventions/Progress Updates:   Received pt sitting in [facility] with husband present at bedside, pt agreeable to therapy, and denied any pain during session but stated L LE felt "tight" from sitting too long. Session with emphasis on functional mobility/transfers, bed mobility, generalized strengthening, dynamic standing balance/coordination, toileting, and improved activity tolerance. Pt transported to rehab apartment in [facility] total A for time management purposes and pt transferred WC<>bed with RW stand<>pivot with supervision while maintaining L LE PWB and required min A to transition from sit<>supine for LE management due to pain. Pt unable to roll to L side due to pain from incision but able to roll R with supervision and transfer R sidelying<>sitting EOB with close supervision and cues for logroll technique. Pt's husband present during session and stated he would be able to assist with LE management. Pt/pt's husband with questions regarding a bedrail and therapist suggested using a bedrail that attaches underneath mattress to allow pt to be more independent with bed mobility. Pt transferred bed<>recliner with RW and supervision and able to transfer recliner<>WC with RW and supervision with pt's husband providing assist and stabilizing recliner. Pt transported back to room in [facility] total A and requested to use restroom. Pt  ambulated 53ft with RW and supervision to bathroom and able to manage clothing with close supervision. Concluded session with pt sitting on commode, husband present at bedside, and pt agreeable to pull call bell cord for assist when finished.   Therapy Documentation Precautions:  Precautions Precautions: Fall Precaution Comments: Anxious with mobility Restrictions Weight Bearing Restrictions: Yes LLE Weight Bearing: Partial weight bearing LLE Partial Weight Bearing Percentage or Pounds:  (25%)  Therapy/Group: Individual Therapy Alfonse Alpers PT, DPT   02/03/2020, 7:35 AM

## 2020-02-03 NOTE — Progress Notes (Signed)
[place] PHYSICAL MEDICINE & REHABILITATION PROGRESS NOTE   Subjective/Complaints: Patient seen working with therapy this morning.  She states she slept well overnight.  She states she had an episode of hypotension yesterday, but feels better this morning.  She would like to leave ASAP.  Later discussed with team.  ROS: Denies CP, SOB, N/V/D  Objective:   No results found. No results for input(s): WBC, HGB, HCT, PLT in the last 72 hours. Recent Labs    02/03/20 0614  NA 129*  K 3.3*  CL 96*  CO2 26  GLUCOSE 102*  BUN 9  CREATININE 0.69  CALCIUM 9.0    Intake/Output Summary (Last 24 hours) at 02/03/2020 1411 Last data filed at 02/03/2020 0713 Gross per 24 hour  Intake 460 ml  Output --  Net 460 ml        Physical Exam: Vital Signs Blood pressure (!) 112/56, pulse 66, temperature 98.1 F (36.7 C), resp. rate 20, height 5\' 5"  (1.651 m), weight 82.6 kg, SpO2 95 %.  Constitutional: No distress . Vital signs reviewed. HENT: Normocephalic.  Atraumatic. Eyes: EOMI. No discharge. Cardiovascular: No JVD.  RRR. Respiratory: Normal effort.  No stridor.  Bilateral clear to auscultation. GI: Non-distended.  BS +. Skin: Warm and dry.  Intact. Psych: Normal mood.  Normal behavior. Musc: No edema in extremities.  No tenderness in extremities. Psych: Normal mood.  Normal behavior. Musc: Left hip with edema and tenderness, improving Neuro: Alert and oriented Motor: Left lower extremity: Hip flexion 2+-3 -/5, knee extension 4 -/5, ankle dorsiflexion 4+/5    Assessment/Plan: 1. Functional deficits secondary to comminuted left intertrochanteric hip fracture status post intramedullary implant which require 3+ hours per day of interdisciplinary therapy in a comprehensive inpatient rehab setting.  Physiatrist is providing close team supervision and 24 hour management of active medical problems listed below.  Physiatrist and rehab team continue to assess barriers to  discharge/monitor patient progress toward functional and medical goals  Care Tool:  Bathing    Body parts bathed by patient: Right arm, Left arm, Chest, Abdomen, Front perineal area, Left upper leg, Right upper leg, Face, Buttocks   Body parts bathed by helper: Right lower leg, Left lower leg     Bathing assist Assist Level: Minimal Assistance - Patient > 75%     Upper Body Dressing/Undressing Upper body dressing   What is the patient wearing?: Pull over shirt    Upper body assist Assist Level: Set up assist    Lower Body Dressing/Undressing Lower body dressing      What is the patient wearing?: Underwear/pull up, Pants     Lower body assist Assist for lower body dressing: Contact Guard/Touching assist     Toileting Toileting    Toileting assist Assist for toileting: Contact Guard/Touching assist     Transfers Chair/bed transfer  Transfers assist     Chair/bed transfer assist level: Supervision/Verbal cueing Chair/bed transfer assistive device: Programmer, multimedia   Ambulation assist      Assist level: Supervision/Verbal cueing Assistive device: Walker-rolling Max distance: 136ft   Walk 10 feet activity   Assist     Assist level: Supervision/Verbal cueing Assistive device: Walker-rolling   Walk 50 feet activity   Assist Walk 50 feet with 2 turns activity did not occur: Safety/medical concerns  Assist level: Supervision/Verbal cueing Assistive device: Walker-rolling    Walk 150 feet activity   Assist Walk 150 feet activity did not occur: Safety/medical concerns  Assist level: Supervision/Verbal  cueing Assistive device: Walker-rolling    Walk 10 feet on uneven surface  activity   Assist Walk 10 feet on uneven surfaces activity did not occur: Safety/medical concerns   Assist level: Supervision/Verbal cueing Assistive device: Aeronautical engineer Will patient use wheelchair at discharge?: No              Wheelchair 50 feet with 2 turns activity    Assist            Wheelchair 150 feet activity     Assist          Medical Problem List and Plan: 1.  Decreased functional mobility secondary to comminuted left intertrochanteric hip fracture with varus angulation.  Status post open treatment with intramedullary implant 01/24/2020.  25% partial weightbearing.  Continue CIR, plan for DC tomorrow 2.  Antithrombotics: -DVT/anticoagulation: Lovenox.   Vascular study limited, but negative             -antiplatelet therapy: N/A 3. Pain Management: Oxycodone and Flexeril as needed  10/3: oxy makes her nausea- ordered tramadol instead  Controlled with meds on 10/4  Monitor with increased exertion 4. Mood: Provide emotional support             -antipsychotic agents: N/A 5. Neuropsych: This patient is capable of making decisions on her own behalf. 6. Skin/Wound Care: Routine skin checks 7. Fluids/Electrolytes/Nutrition: Routine in and outs.  8.  Acute blood loss anemia.               Hemoglobin 10.7 on 9/30  Continue to monitor 9.  Hypertension.  Chlorthalidone 50 mg daily, Toprol-XL 50 mg daily.    Decreased Cozaar to 50mg .   Slightly soft on 10/4, not make any changes today given recent medication adjustments             Monitor with increased mobility 11. Drug induced constipation.  Senokot S daily and MiraLAX daily             Improving  Adjust bowel meds as necessary 12.  Hyponatremia  Sodium 129 on 10/4  Continue to monitor  13.  Transaminitis: Resolved  ALT within normal limits on 10/4 14.  Hypokalemia  Potassium 3.3 on 10/4, supplemented x1 day   LOS: 5 days A FACE TO FACE EVALUATION WAS PERFORMED  Kimberly Whitney Lorie Phenix 02/03/2020, 2:11 PM

## 2020-02-04 ENCOUNTER — Other Ambulatory Visit (HOSPITAL_COMMUNITY): Payer: Self-pay | Admitting: Physician Assistant

## 2020-02-04 MED ORDER — LOSARTAN POTASSIUM 100 MG PO TABS: 100.0000 mg | ORAL_TABLET | Freq: Every day | ORAL | 0 refills | Status: DC

## 2020-02-04 MED ORDER — CYCLOBENZAPRINE HCL 5 MG PO TABS: 5.0000 mg | ORAL_TABLET | Freq: Three times a day (TID) | ORAL | 0 refills | Status: DC | PRN

## 2020-02-04 MED ORDER — ACETAMINOPHEN 325 MG PO TABS: 650.0000 mg | ORAL_TABLET | Freq: Four times a day (QID) | ORAL | Status: DC | PRN

## 2020-02-04 MED ORDER — DOCUSATE SODIUM 100 MG PO CAPS: 100.0000 mg | ORAL_CAPSULE | Freq: Two times a day (BID) | ORAL | 0 refills | Status: DC

## 2020-02-04 MED ORDER — POLYETHYLENE GLYCOL 3350 17 G PO PACK: 17.0000 g | PACK | Freq: Every day | ORAL | 0 refills | Status: DC

## 2020-02-04 MED ORDER — METOPROLOL SUCCINATE ER 50 MG PO TB24: 50.0000 mg | ORAL_TABLET | Freq: Every evening | ORAL | 0 refills | Status: DC

## 2020-02-04 MED ORDER — MAGNESIUM 30 MG PO TABS: 30.0000 mg | ORAL_TABLET | Freq: Every day | ORAL | 0 refills | Status: DC

## 2020-02-04 MED ORDER — TRAMADOL HCL 50 MG PO TABS
50.0000 mg | ORAL_TABLET | Freq: Four times a day (QID) | ORAL | 0 refills | Status: DC | PRN
Start: 2020-02-04 — End: 2020-02-04

## 2020-02-04 MED ORDER — CHLORTHALIDONE 50 MG PO TABS: 50.0000 mg | ORAL_TABLET | Freq: Every day | ORAL | 0 refills | Status: DC

## 2020-02-04 MED FILL — METOPROLOL SUCCINATE ER 50: 50 | 30 days supply | Qty: 30 | Fill #0

## 2020-02-04 MED FILL — traMADol HCL 50 MG TABS: 50 | 7 days supply | Qty: 30 | Fill #0

## 2020-02-04 MED FILL — LOSARTAN POTASSIUM 100 MG T: 100 | 30 days supply | Qty: 30 | Fill #0

## 2020-02-04 MED FILL — CHLORTHALIDONE 25 MG TAB: 25 | 30 days supply | Qty: 60 | Fill #0

## 2020-02-04 MED FILL — MAGNESIUM OXIDE 400 MG TABS: 400 | 30 days supply | Qty: 30 | Fill #0

## 2020-02-04 MED FILL — CYCLOBENZAPRINE HCL 5 MG TA: 5 | 20 days supply | Qty: 60 | Fill #0

## 2020-02-04 NOTE — Progress Notes (Signed)
[place] PHYSICAL MEDICINE & REHABILITATION PROGRESS NOTE   Subjective/Complaints: Patient seen laying in bed this morning.  She states she slept well overnight.  Husband at bedside.  She states she is ready for discharge.  She has questions regarding staple removal and use of TED hose.  ROS: Denies CP, SOB, N/V/D  Objective:   No results found. No results for input(s): WBC, HGB, HCT, PLT in the last 72 hours. Recent Labs    02/03/20 0614  NA 129*  K 3.3*  CL 96*  CO2 26  GLUCOSE 102*  BUN 9  CREATININE 0.69  CALCIUM 9.0    Intake/Output Summary (Last 24 hours) at 02/04/2020 1121 Last data filed at 02/04/2020 0720 Gross per 24 hour  Intake 960 ml  Output --  Net 960 ml        Physical Exam: Vital Signs Blood pressure (!) 103/52, pulse 73, temperature 98.5 F (36.9 C), resp. rate 16, height 5\' 5"  (1.651 m), weight 82.6 kg, SpO2 96 %.  Constitutional: No distress . Vital signs reviewed. HENT: Normocephalic.  Atraumatic. Eyes: EOMI. No discharge. Cardiovascular: No JVD.  RRR. Respiratory: Normal effort.  No stridor.  Bilateral clear to auscultation. GI: Non-distended.  BS +. Skin: Warm and dry.  Intact with staples CDI. Psych: Normal mood.  Normal behavior. Musc: Left hip with edema and tenderness, improving Neuro: Alert and oriented Motor: Left lower extremity: Hip flexion 2+-3 -/5, knee extension 4 -/5, ankle dorsiflexion 4+/5, improving   Assessment/Plan: 1. Functional deficits secondary to comminuted left intertrochanteric hip fracture status post intramedullary implant which require 3+ hours per day of interdisciplinary therapy in a comprehensive inpatient rehab setting.  Physiatrist is providing close team supervision and 24 hour management of active medical problems listed below.  Physiatrist and rehab team continue to assess barriers to discharge/monitor patient progress toward functional and medical goals  Care Tool:  Bathing    Body parts bathed  by patient: Right arm, Left arm, Chest, Abdomen, Front perineal area, Left upper leg, Right upper leg, Face, Buttocks   Body parts bathed by helper: Right lower leg, Left lower leg     Bathing assist Assist Level: Minimal Assistance - Patient > 75%     Upper Body Dressing/Undressing Upper body dressing   What is the patient wearing?: Pull over shirt    Upper body assist Assist Level: Set up assist    Lower Body Dressing/Undressing Lower body dressing      What is the patient wearing?: Underwear/pull up, Pants     Lower body assist Assist for lower body dressing: Contact Guard/Touching assist     Toileting Toileting    Toileting assist Assist for toileting: Contact Guard/Touching assist     Transfers Chair/bed transfer  Transfers assist     Chair/bed transfer assist level: Supervision/Verbal cueing Chair/bed transfer assistive device: Programmer, multimedia   Ambulation assist      Assist level: Supervision/Verbal cueing Assistive device: Walker-rolling Max distance: 170ft   Walk 10 feet activity   Assist     Assist level: Supervision/Verbal cueing Assistive device: Walker-rolling   Walk 50 feet activity   Assist Walk 50 feet with 2 turns activity did not occur: Safety/medical concerns  Assist level: Supervision/Verbal cueing Assistive device: Walker-rolling    Walk 150 feet activity   Assist Walk 150 feet activity did not occur: Safety/medical concerns  Assist level: Supervision/Verbal cueing Assistive device: Walker-rolling    Walk 10 feet on uneven surface  activity  Assist Walk 10 feet on uneven surfaces activity did not occur: Safety/medical concerns   Assist level: Supervision/Verbal cueing Assistive device: Aeronautical engineer Will patient use wheelchair at discharge?: No             Wheelchair 50 feet with 2 turns activity    Assist            Wheelchair 150 feet  activity     Assist          Medical Problem List and Plan: 1.  Decreased functional mobility secondary to comminuted left intertrochanteric hip fracture with varus angulation.  Status post open treatment with intramedullary implant 01/24/2020.  25% partial weightbearing.  DC today-patient eager to discharge and insisting to move update  Will see patient for hospital follow-up in 1 month post-discharge  DC staples 2.  Antithrombotics: -DVT/anticoagulation: Lovenox.   Vascular study limited, but negative             -antiplatelet therapy: N/A 3. Pain Management: Oxycodone and Flexeril as needed  10/3: oxy makes her nausea- ordered tramadol instead  Controlled with meds on 10/5  Monitor with increased exertion 4. Mood: Provide emotional support             -antipsychotic agents: N/A 5. Neuropsych: This patient is capable of making decisions on her own behalf. 6. Skin/Wound Care: Routine skin checks 7. Fluids/Electrolytes/Nutrition: Routine in and outs.  8.  Acute blood loss anemia.               Hemoglobin 10.7 on 9/30  Continue to monitor 9.  Hypertension.  Chlorthalidone 50 mg daily, Toprol-XL 50 mg daily.    Decreased Cozaar to 50mg .   Slightly soft on 10/5, continue to monitor ambulatory setting with potential further adjustments             Monitor with increased mobility 11. Drug induced constipation.  Senokot S daily and MiraLAX daily             Improving  Adjust bowel meds as necessary 12.  Hyponatremia  Sodium 129 on 10/4, monitor as outpatient  Continue to monitor  13.  Transaminitis: Resolved  ALT within normal limits on 10/4 14.  Hypokalemia  Potassium 3.3 on 10/4, supplemented x1 day, follow-up as outpatient  > 30 minutes spent in total in discharge planning between myself and PA regarding aforementioned, as well discussion regarding DME equipment, follow-up appointments, follow-up therapies, discharge medications, discharge recommendations   LOS: 6 days A  FACE TO FACE EVALUATION WAS PERFORMED  Lakin Romer Lorie Phenix 02/04/2020, 11:21 AM

## 2020-02-04 NOTE — Progress Notes (Signed)
Patient D/C at 1130.  Husband present for discharge.  Patient and family verbal understanding of discharge instructions.  Patient went home with husband in personal vehicle.

## 2020-02-04 NOTE — Discharge Instructions (Signed)
Inpatient Rehab Discharge Instructions  Kimberly Whitney Discharge date and time: No discharge date for patient encounter.   Activities/Precautions/ Functional Status: Activity: 25% partial weightbearing left lower extremity Diet: regular diet Wound Care: keep wound clean and dry Functional status:  ___ No restrictions     ___ Walk up steps independently ___ 24/7 supervision/assistance   ___ Walk up steps with assistance ___ Intermittent supervision/assistance  ___ Bathe/dress independently ___ Walk with walker     _x__ Bathe/dress with assistance ___ Walk Independently    ___ Shower independently ___ Walk with assistance    ___ Shower with assistance ___ No alcohol     ___ Return to work/school ________  Special Instructions:  No driving smoking or alcohol    COMMUNITY REFERRALS UPON DISCHARGE:    Home Health:   PT                 Agency: [facility]  Phone: [phone]   Medical Equipment/Items Ordered:rolling walker, 3 in 1 and tub bench                                                 Agency/Supplier:[facility]  [phone]  My questions have been answered and I understand these instructions. I will adhere to these goals and the provided educational materials after my discharge from the hospital.  Patient/Caregiver Signature _______________________________ Date __________  Clinician Signature _______________________________________ Date __________  Please bring this form and your medication list with you to all your follow-up doctor's appointments.

## 2020-02-04 NOTE — Progress Notes (Signed)
Inpatient Rehabilitation Care Coordinator  Discharge Note  The overall goal for the admission was met for:   Discharge location: Yes-HOME WITH HUSBAND WHO IS TAKING TWO WEEKS OFF TO BE HOME WITH PT FOR THE TRANSITION  Length of Stay: Yes-6 DAYS  Discharge activity level: Yes-SUPERVISION-CGA LEVEL  Home/community participation: Yes  Services provided included: MD, RD, PT, OT, RN, CM, Pharmacy and SW  Financial Services: Medicare and Private Insurance: [place]  Follow-up services arranged: Home Health: [facility]-PT, DME: [facility]-RW, 3 IN 1 AND TUB BENCH and Patient/Family has no preference for HH/DME agencies  Comments (or additional information):HUSBAND WAS [place]. BOTH FEEL PREPARED FOR DISCHARGE TODAY  Patient/Family verbalized understanding of follow-up arrangements: Yes  Individual responsible for coordination of the follow-up plan: Domingo Madeira [phone]  Confirmed correct DME delivered: Elease Hashimoto 02/04/2020    Elease Hashimoto

## 2020-02-12 ENCOUNTER — Ambulatory Visit: Payer: Self-pay

## 2020-02-12 ENCOUNTER — Ambulatory Visit (INDEPENDENT_AMBULATORY_CARE_PROVIDER_SITE_OTHER): Payer: BC Managed Care – PPO | Admitting: Orthopaedic Surgery

## 2020-02-12 ENCOUNTER — Encounter: Payer: Self-pay | Admitting: Orthopaedic Surgery

## 2020-02-12 DIAGNOSIS — S72142A Displaced intertrochanteric fracture of left femur, initial encounter for closed fracture: Secondary | ICD-10-CM | POA: Diagnosis not present

## 2020-02-12 NOTE — Progress Notes (Signed)
   Post-Op Visit Note   Patient: Kimberly Whitney           Date of Birth: 07-26-1950           MRN: [id number] Visit Date: 02/12/2020 PCP: Pcp, No   Assessment & Plan:  Chief Complaint:  Chief Complaint  Patient presents with  . Left Hip - Pain   Visit Diagnoses:  1. Displaced intertrochanteric fracture of left femur, initial encounter for closed fracture [facility])     Plan: Kimberly Whitney is approximately 3 weeks status post IM nail left intertrochanteric femur fracture.  Overall doing better.  No significant discomfort.  She completed her Lovenox.  She is currently getting home health PT.  She is still doing 25% partial weightbearing.  Surgical incisions are all healed.  No signs of infection.  Strength is slowly improving.  No calf tenderness.  Minimal swelling.  X-rays demonstrate stable fixation alignment of the fracture without any complications.  We will continue 25% partial weightbearing for another 3 weeks.  I have recommended that she take baby aspirin twice a day for DVT prophylaxis.  Handicap form provided today.  She will continue with calcium and vitamin D supplements.  Follow-up in 3 weeks with two-view x-rays of the left femur.  Follow-Up Instructions: Return in about 3 weeks (around 03/04/2020).   Orders:  Orders Placed This Encounter  Procedures  . XR FEMUR MIN 2 VIEWS LEFT   No orders of the defined types were placed in this encounter.   Imaging: XR FEMUR MIN 2 VIEWS LEFT  Result Date: 02/12/2020 Stable fixation alignment of intertrochanteric fracture without complication.   PMFS History: Patient Active Problem List   Diagnosis Date Noted  . Hypokalemia   . Drug-induced hypotension   . Hyponatremia   . Transaminitis   . Intertrochanteric fracture of left hip ([place]) 01/29/2020  . Closed nondisplaced intertrochanteric fracture of left femur ([place])   . Drug induced constipation   . Acute blood loss anemia   . Postoperative pain   . Displaced intertrochanteric fracture  of left femur, initial encounter for closed fracture ([place]) 01/23/2020  . Essential hypertension 01/23/2020   Past Medical History:  Diagnosis Date  . Hypertension   . Osteopenia     History reviewed. No pertinent family history.  Past Surgical History:  Procedure Laterality Date  . CHOLECYSTECTOMY    . FRACTURE SURGERY     L hip 30 years ago  . INTRAMEDULLARY (IM) NAIL INTERTROCHANTERIC Left 01/23/2020   Procedure: INTRAMEDULLARY (IM) NAIL INTERTROCHANTRIC;  Surgeon: Leandrew Koyanagi, MD;  Location: [place];  Service: Orthopedics;  Laterality: Left;   Social History   Occupational History  . Not on file  Tobacco Use  . Smoking status: Former Research scientist (life sciences)  . Smokeless tobacco: Never Used  Substance and Sexual Activity  . Alcohol use: Never  . Drug use: Not on file  . Sexual activity: Not on file

## 2020-03-04 ENCOUNTER — Encounter: Payer: Self-pay | Admitting: Orthopaedic Surgery

## 2020-03-04 ENCOUNTER — Ambulatory Visit (INDEPENDENT_AMBULATORY_CARE_PROVIDER_SITE_OTHER): Payer: BC Managed Care – PPO | Admitting: Orthopaedic Surgery

## 2020-03-04 ENCOUNTER — Other Ambulatory Visit: Payer: Self-pay

## 2020-03-04 ENCOUNTER — Ambulatory Visit (INDEPENDENT_AMBULATORY_CARE_PROVIDER_SITE_OTHER): Payer: BC Managed Care – PPO

## 2020-03-04 DIAGNOSIS — S72142A Displaced intertrochanteric fracture of left femur, initial encounter for closed fracture: Secondary | ICD-10-CM | POA: Diagnosis not present

## 2020-03-04 NOTE — Progress Notes (Signed)
   Post-Op Visit Note   Patient: Kimberly Whitney           Date of Birth: [birth year]           MRN: [id number] Visit Date: 03/04/2020 PCP: Pcp, No   Assessment & Plan:  Chief Complaint:  Chief Complaint  Patient presents with  . Left Hip - Pain   Visit Diagnoses:  1. Displaced intertrochanteric fracture of left femur, initial encounter for closed fracture [facility])     Plan: Brendaly is 6 weeks status post IM nail left intertrochanteric fracture.  She is feeling much better.  Currently getting home health PT once a week.  She has been on 25% partial weightbearing.  Surgical scars are fully healed.  She reports no significant pain.  Her x-rays show progressive healing of the fracture.  At this point we will allow her to weight-bear as tolerated.  A referral to outpatient PT was made down in [place].  Follow-up in 6 weeks with two-view x-rays of the left femur.  Follow-Up Instructions: Return in about 6 weeks (around 04/15/2020).   Orders:  Orders Placed This Encounter  Procedures  . XR FEMUR MIN 2 VIEWS LEFT   No orders of the defined types were placed in this encounter.   Imaging: XR FEMUR MIN 2 VIEWS LEFT  Result Date: 03/04/2020 X-rays demonstrate continued healing of the fracture with consolidation of fracture.  No hardware complications.  Alignment of the fracture stable.   PMFS History: Patient Active Problem List   Diagnosis Date Noted  . Hypokalemia   . Drug-induced hypotension   . Hyponatremia   . Transaminitis   . Intertrochanteric fracture of left hip ([place]) 01/29/2020  . Closed nondisplaced intertrochanteric fracture of left femur ([place])   . Drug induced constipation   . Acute blood loss anemia   . Postoperative pain   . Displaced intertrochanteric fracture of left femur, initial encounter for closed fracture ([place]) 01/23/2020  . Essential hypertension 01/23/2020   Past Medical History:  Diagnosis Date  . Hypertension   . Osteopenia     History reviewed. No  pertinent family history.  Past Surgical History:  Procedure Laterality Date  . CHOLECYSTECTOMY    . FRACTURE SURGERY     L hip 30 years ago  . INTRAMEDULLARY (IM) NAIL INTERTROCHANTERIC Left 01/23/2020   Procedure: INTRAMEDULLARY (IM) NAIL INTERTROCHANTRIC;  Surgeon: Leandrew Koyanagi, MD;  Location: [place];  Service: Orthopedics;  Laterality: Left;   Social History   Occupational History  . Not on file  Tobacco Use  . Smoking status: Former Research scientist (life sciences)  . Smokeless tobacco: Never Used  Substance and Sexual Activity  . Alcohol use: Never  . Drug use: Not on file  . Sexual activity: Not on file

## 2020-03-12 ENCOUNTER — Inpatient Hospital Stay: Payer: BC Managed Care – PPO | Admitting: Physical Medicine & Rehabilitation

## 2020-04-15 ENCOUNTER — Telehealth: Payer: Self-pay

## 2020-04-15 ENCOUNTER — Other Ambulatory Visit: Payer: Self-pay

## 2020-04-15 ENCOUNTER — Encounter: Payer: Self-pay | Admitting: Orthopaedic Surgery

## 2020-04-15 ENCOUNTER — Ambulatory Visit (INDEPENDENT_AMBULATORY_CARE_PROVIDER_SITE_OTHER): Payer: BC Managed Care – PPO | Admitting: Orthopaedic Surgery

## 2020-04-15 ENCOUNTER — Ambulatory Visit (INDEPENDENT_AMBULATORY_CARE_PROVIDER_SITE_OTHER): Payer: BC Managed Care – PPO

## 2020-04-15 DIAGNOSIS — S72142A Displaced intertrochanteric fracture of left femur, initial encounter for closed fracture: Secondary | ICD-10-CM

## 2020-04-15 NOTE — Telephone Encounter (Signed)
Per Dr. Erlinda Hong need to order a orthofix bone stimulator. Kimberly Whitney called and will be working on this for the pt.

## 2020-04-15 NOTE — Progress Notes (Signed)
   Post-Op Visit Note   Patient: Kimberly Whitney           Date of Birth: [birth year]           MRN: [id number] Visit Date: 04/15/2020 PCP: Pcp, No   Assessment & Plan:  Chief Complaint:  Chief Complaint  Patient presents with  . Left Leg - Follow-up   Visit Diagnoses:  1. Displaced intertrochanteric fracture of left femur, initial encounter for closed fracture [facility])     Plan:   [name] is 12 weeks status post IM nail left intertrochanteric fracture.  She is overall doing much better and progressing with her activity.  She is doing outpatient PT at [facility] in [place].  Walking with a single-point cane.  She is not taking anything for pain.  Physical exam shows fully healed surgical scars.  Strength is improving significantly.  Range of motion is also improving with minimal discomfort.  X-rays show that the fixation is stable but she still has a persistent fracture line on the lateral cortex.  Impression is 12 weeks status post IM nail left intertrochanteric fracture with delayed union.  At this point I would like to get her set up with a bone stimulator to help fracture healing.  She will continue with outpatient PT.  Follow-up in 2 months with two-view x-rays of the left hip.  Follow-Up Instructions: Return in about 2 months (around 06/16/2020).   Orders:  Orders Placed This Encounter  Procedures  . XR FEMUR MIN 2 VIEWS LEFT   No orders of the defined types were placed in this encounter.   Imaging: XR FEMUR MIN 2 VIEWS LEFT  Result Date: 04/15/2020 Stable fixation alignment of intertrochanteric fracture.  Fracture line persists laterally.   PMFS History: Patient Active Problem List   Diagnosis Date Noted  . Hypokalemia   . Drug-induced hypotension   . Hyponatremia   . Transaminitis   . Intertrochanteric fracture of left hip ([place]) 01/29/2020  . Closed nondisplaced intertrochanteric fracture of left femur ([place])   . Drug induced constipation   . Acute blood loss anemia    . Postoperative pain   . Displaced intertrochanteric fracture of left femur, initial encounter for closed fracture ([place]) 01/23/2020  . Essential hypertension 01/23/2020   Past Medical History:  Diagnosis Date  . Hypertension   . Osteopenia     History reviewed. No pertinent family history.  Past Surgical History:  Procedure Laterality Date  . CHOLECYSTECTOMY    . FRACTURE SURGERY     L hip 30 years ago  . INTRAMEDULLARY (IM) NAIL INTERTROCHANTERIC Left 01/23/2020   Procedure: INTRAMEDULLARY (IM) NAIL INTERTROCHANTRIC;  Surgeon: Leandrew Koyanagi, MD;  Location: [facility];  Service: Orthopedics;  Laterality: Left;   Social History   Occupational History  . Not on file  Tobacco Use  . Smoking status: Former Research scientist (life sciences)  . Smokeless tobacco: Never Used  Substance and Sexual Activity  . Alcohol use: Never  . Drug use: Not on file  . Sexual activity: Not on file

## 2020-04-20 ENCOUNTER — Telehealth: Payer: Self-pay

## 2020-04-20 ENCOUNTER — Telehealth: Payer: Self-pay | Admitting: Orthopaedic Surgery

## 2020-04-20 NOTE — Telephone Encounter (Signed)
Pt had a question about why she needed another xray on or after 04/22/20 when she just had a xray last week. Per orthofix rep pt's insurance requires the xray to be after that date for approval of the orthofix. Pt stated understanding

## 2020-04-20 NOTE — Telephone Encounter (Signed)
Kimberly Whitney called requesting a call back from [place]. regarding above patient. Kimberly Whitney did not say if he is calling from a facility or a physical therapist. Kimberly Whitney  phone number is [phone].

## 2020-04-20 NOTE — Telephone Encounter (Signed)
Called and per [place] pt needs to come in wed for xrays. Pt was called and this was scheduled

## 2020-04-22 ENCOUNTER — Encounter: Payer: Self-pay | Admitting: Orthopaedic Surgery

## 2020-04-22 ENCOUNTER — Ambulatory Visit (INDEPENDENT_AMBULATORY_CARE_PROVIDER_SITE_OTHER): Payer: BC Managed Care – PPO | Admitting: Orthopaedic Surgery

## 2020-04-22 ENCOUNTER — Ambulatory Visit (INDEPENDENT_AMBULATORY_CARE_PROVIDER_SITE_OTHER): Payer: BC Managed Care – PPO

## 2020-04-22 DIAGNOSIS — S72142A Displaced intertrochanteric fracture of left femur, initial encounter for closed fracture: Secondary | ICD-10-CM

## 2020-04-22 NOTE — Progress Notes (Signed)
   Post-Op Visit Note   Patient: Kimberly Whitney           Date of Birth: 1950-10-04           MRN: [id number] Visit Date: 04/22/2020 PCP: Pcp, No   Assessment & Plan:  Chief Complaint:  Chief Complaint  Patient presents with  . Left Leg - Pain   Visit Diagnoses:  1. Displaced intertrochanteric fracture of left femur, initial encounter for closed fracture [facility])     Plan:   Simi returns today for x-rays per insurance guidelines for approval of bone stimulator.  Reports no changes.  She had x-rays taken today without any difficulty.  X-rays reviewed personally which shows persistent fracture gap of the lateral cortex fracture greater than 2 mm with intact hardware consistent with delayed union.  We will resubmit request for bone stimulator.  Patient will follow up with [place] as scheduled.  She will continue to increase activity as tolerated.  Follow-Up Instructions: Return for as scheduled.   Orders:  Orders Placed This Encounter  Procedures  . XR FEMUR MIN 2 VIEWS LEFT   No orders of the defined types were placed in this encounter.   Imaging: XR FEMUR MIN 2 VIEWS LEFT  Result Date: 04/22/2020 Intact hardware.  There is a persistent fracture gap on the lateral cortex of greater than 2 mm.  There is some partial consolidation of the fracture.  Overall findings consistent with delayed union.   PMFS History: Patient Active Problem List   Diagnosis Date Noted  . Hypokalemia   . Drug-induced hypotension   . Hyponatremia   . Transaminitis   . Intertrochanteric fracture of left hip ([place]) 01/29/2020  . Closed nondisplaced intertrochanteric fracture of left femur ([place])   . Drug induced constipation   . Acute blood loss anemia   . Postoperative pain   . Displaced intertrochanteric fracture of left femur, initial encounter for closed fracture ([place]) 01/23/2020  . Essential hypertension 01/23/2020   Past Medical History:  Diagnosis Date  . Hypertension   . Osteopenia      History reviewed. No pertinent family history.  Past Surgical History:  Procedure Laterality Date  . CHOLECYSTECTOMY    . FRACTURE SURGERY     L hip 30 years ago  . INTRAMEDULLARY (IM) NAIL INTERTROCHANTERIC Left 01/23/2020   Procedure: INTRAMEDULLARY (IM) NAIL INTERTROCHANTRIC;  Surgeon: Leandrew Koyanagi, MD;  Location: [facility];  Service: Orthopedics;  Laterality: Left;   Social History   Occupational History  . Not on file  Tobacco Use  . Smoking status: Former Research scientist (life sciences)  . Smokeless tobacco: Never Used  Substance and Sexual Activity  . Alcohol use: Never  . Drug use: Not on file  . Sexual activity: Not on file

## 2020-05-28 ENCOUNTER — Other Ambulatory Visit: Payer: Self-pay | Admitting: Physician Assistant

## 2020-05-28 DIAGNOSIS — I10 Essential (primary) hypertension: Secondary | ICD-10-CM

## 2020-05-28 DIAGNOSIS — U071 COVID-19: Secondary | ICD-10-CM

## 2020-05-28 DIAGNOSIS — Z6827 Body mass index (BMI) 27.0-27.9, adult: Secondary | ICD-10-CM

## 2020-05-28 NOTE — Progress Notes (Signed)
I connected by phone with Kimberly Whitney on 05/28/2020 at 4:08 PM to discuss the potential use of a new treatment for mild to moderate COVID-19 viral infection in non-hospitalized patients.  This patient is a 70 y.o. female that meets the FDA criteria for Emergency Use Authorization of COVID monoclonal antibody sotrovimab.  Has a (+) direct SARS-CoV-2 viral test result  Has mild or moderate COVID-19   Is NOT hospitalized due to COVID-19  Is within 10 days of symptom onset  Has at least one of the high risk factor(s) for progression to severe COVID-19 and/or hospitalization as defined in EUA.  Specific high risk criteria : Older age (>/= 69 yo), BMI > 25, Cardiovascular disease or hypertension and Other high risk medical condition per CDC:  unvaccinated, high SVI   I have spoken and communicated the following to the patient or parent/caregiver regarding COVID monoclonal antibody treatment:  1. FDA has authorized the emergency use for the treatment of mild to moderate COVID-19 in adults and pediatric patients with positive results of direct SARS-CoV-2 viral testing who are 55 years of age and older weighing at least 40 kg, and who are at high risk for progressing to severe COVID-19 and/or hospitalization.  2. The significant known and potential risks and benefits of COVID monoclonal antibody, and the extent to which such potential risks and benefits are unknown.  3. Information on available alternative treatments and the risks and benefits of those alternatives, including clinical trials.  4. Patients treated with COVID monoclonal antibody should continue to self-isolate and use infection control measures (e.g., wear mask, isolate, social distance, avoid sharing personal items, clean and disinfect "high touch" surfaces, and frequent handwashing) according to CDC guidelines.   5. The patient or parent/caregiver has the option to accept or refuse COVID monoclonal antibody treatment.  After  reviewing this information with the patient, the patient has agreed to receive one of the available covid 19 monoclonal antibodies and will be provided an appropriate fact sheet prior to infusion.   Sx onset 1/25. Set up for infusion tomorrow 1/28 @ 1:30pm. Shanon Payor, PA-C 05/28/2020 4:08 PM

## 2020-05-29 ENCOUNTER — Ambulatory Visit (HOSPITAL_COMMUNITY)
Admission: RE | Admit: 2020-05-29 | Discharge: 2020-05-29 | Disposition: A | Discharge status: Home / Self Care | Payer: Medicare Other | Source: Ambulatory Visit | Attending: Pulmonary Disease | Admitting: Pulmonary Disease

## 2020-05-29 DIAGNOSIS — I10 Essential (primary) hypertension: Secondary | ICD-10-CM | POA: Diagnosis not present

## 2020-05-29 DIAGNOSIS — Z6827 Body mass index (BMI) 27.0-27.9, adult: Secondary | ICD-10-CM

## 2020-05-29 DIAGNOSIS — U071 COVID-19: Secondary | ICD-10-CM | POA: Diagnosis present

## 2020-05-29 MED ORDER — SODIUM CHLORIDE 0.9 % IV SOLN: INTRAVENOUS | Status: DC | PRN

## 2020-05-29 MED ORDER — SOTROVIMAB 500 MG/8ML IV SOLN
500.0000 mg | Freq: Once | INTRAVENOUS | Status: AC
  Administered 2020-05-29: 500 mg via INTRAVENOUS

## 2020-05-29 MED ORDER — METHYLPREDNISOLONE SODIUM SUCC 125 MG IJ SOLR: 125.0000 mg | Freq: Once | INTRAMUSCULAR | Status: DC | PRN

## 2020-05-29 MED ORDER — DIPHENHYDRAMINE HCL 50 MG/ML IJ SOLN: 50.0000 mg | Freq: Once | INTRAMUSCULAR | Status: DC | PRN

## 2020-05-29 MED ORDER — EPINEPHRINE 0.3 MG/0.3ML IJ SOAJ: 0.3000 mg | Freq: Once | INTRAMUSCULAR | Status: DC | PRN

## 2020-05-29 MED ORDER — ALBUTEROL SULFATE HFA 108 (90 BASE) MCG/ACT IN AERS: 2.0000 | INHALATION_SPRAY | Freq: Once | RESPIRATORY_TRACT | Status: DC | PRN

## 2020-05-29 MED ORDER — FAMOTIDINE IN NACL 20-0.9 MG/50ML-% IV SOLN: 20.0000 mg | Freq: Once | INTRAVENOUS | Status: DC | PRN

## 2020-05-29 NOTE — Progress Notes (Signed)
Patient reviewed Fact Sheet for Patients, Parents, and Caregivers for Emergency Use Authorization (EUA) of sotrovimab for the Treatment of Coronavirus. Patient also reviewed and is agreeable to the estimated cost of treatment. Patient is agreeable to proceed.   

## 2020-05-29 NOTE — Discharge Instructions (Signed)

## 2020-05-29 NOTE — Progress Notes (Addendum)
Diagnosis: COVID-19  Physician: Dr. Patrick Wright  Procedure: [facility] Infusion [facility] Med: Sotrovimab infusion - Provided patient with sotrovimab fact sheet for patients, parents, and caregivers prior to infusion.   Complications: No immediate complications noted  Discharge: Discharged home by Brittany, RN   

## 2020-06-17 ENCOUNTER — Encounter: Payer: Self-pay | Admitting: Orthopaedic Surgery

## 2020-06-17 ENCOUNTER — Ambulatory Visit (INDEPENDENT_AMBULATORY_CARE_PROVIDER_SITE_OTHER): Payer: Medicare HMO

## 2020-06-17 ENCOUNTER — Ambulatory Visit: Payer: Medicare HMO | Admitting: Orthopaedic Surgery

## 2020-06-17 DIAGNOSIS — S72142A Displaced intertrochanteric fracture of left femur, initial encounter for closed fracture: Secondary | ICD-10-CM

## 2020-06-17 NOTE — Progress Notes (Signed)
   Post-Op Visit Note   Patient: Kimberly Whitney           Date of Birth: 06/21/1950           MRN: [id number] Visit Date: 06/17/2020 PCP: Pcp, No   Assessment & Plan:  Chief Complaint:  Chief Complaint  Patient presents with  . Left Hip - Pain, Follow-up   Visit Diagnoses:  1. Displaced intertrochanteric fracture of left femur, initial encounter for closed fracture [facility])     Plan: Patient is a pleasant 70 year old female who comes in today 5 months out IM nail left inner troches fracture.  She has been doing fairly well.  She notes continued discomfort to the left hip but overall has made great progress.  She finished physical therapy January 1.  She ambulates without assistance but does carry a cane to have if needed.  She has been using a bone stimulator for about 2 months.  Examination of her left hip reveals fully healed surgical scars without complication.  Negative logroll.  Near full hip flexion.  She is neurovascular intact distally.  At this point, her fracture has continued to heal but has not completely solidified.  We will have her continue wearing her bone stimulator for another 2 months.  Follow-up with [place] at that point time for repeat evaluation and x-rays of the left femur.  Call with concerns or questions.  Follow-Up Instructions: Return in about 2 months (around 08/15/2020).   Orders:  Orders Placed This Encounter  Procedures  . XR HIP UNILAT W OR W/O PELVIS 2-3 VIEWS LEFT   No orders of the defined types were placed in this encounter.   Imaging: XR HIP UNILAT W OR W/O PELVIS 2-3 VIEWS LEFT  Result Date: 06/17/2020 X-rays demonstrate continued consolidation to the fracture sites.  There still remains small lucency to the butterfly fragment on the lateral cortex.  No hardware complications.   PMFS History: Patient Active Problem List   Diagnosis Date Noted  . Hypokalemia   . Drug-induced hypotension   . Hyponatremia   . Transaminitis   . Intertrochanteric  fracture of left hip ([place]) 01/29/2020  . Closed nondisplaced intertrochanteric fracture of left femur ([place])   . Drug induced constipation   . Acute blood loss anemia   . Postoperative pain   . Displaced intertrochanteric fracture of left femur, initial encounter for closed fracture ([place]) 01/23/2020  . Essential hypertension 01/23/2020   Past Medical History:  Diagnosis Date  . Hypertension   . Osteopenia     History reviewed. No pertinent family history.  Past Surgical History:  Procedure Laterality Date  . CHOLECYSTECTOMY    . FRACTURE SURGERY     L hip 30 years ago  . INTRAMEDULLARY (IM) NAIL INTERTROCHANTERIC Left 01/23/2020   Procedure: INTRAMEDULLARY (IM) NAIL INTERTROCHANTRIC;  Surgeon: Leandrew Koyanagi, MD;  Location: [facility];  Service: Orthopedics;  Laterality: Left;   Social History   Occupational History  . Not on file  Tobacco Use  . Smoking status: Former Research scientist (life sciences)  . Smokeless tobacco: Never Used  Substance and Sexual Activity  . Alcohol use: Never  . Drug use: Not on file  . Sexual activity: Not on file

## 2020-08-13 ENCOUNTER — Encounter: Payer: Self-pay | Admitting: Orthopaedic Surgery

## 2020-08-13 ENCOUNTER — Ambulatory Visit: Payer: Medicare HMO | Admitting: Orthopaedic Surgery

## 2020-08-13 ENCOUNTER — Other Ambulatory Visit: Payer: Self-pay

## 2020-08-13 ENCOUNTER — Ambulatory Visit (INDEPENDENT_AMBULATORY_CARE_PROVIDER_SITE_OTHER): Payer: Medicare HMO

## 2020-08-13 DIAGNOSIS — S72142A Displaced intertrochanteric fracture of left femur, initial encounter for closed fracture: Secondary | ICD-10-CM

## 2020-08-13 NOTE — Progress Notes (Signed)
Office Visit Note   Patient: Kimberly Whitney           Date of Birth: [birth year]           MRN: [id number] Visit Date: 08/13/2020              Requested by: No referring provider defined for this encounter. PCP: Rochel Brome, MD   Assessment & Plan: Visit Diagnoses:  1. Displaced intertrochanteric fracture of left femur, initial encounter for closed fracture [facility])     Plan: Tarae is 35-month status post surgical fixation of left subtrochanteric fracture.  She is making progress.  She should continue to use the bone stimulator as this is helping with fracture healing.  At this point I think the biggest issue is just she is weak from a muscular standpoint.  We will have her continue with the trainer at the [facility].  She declined a referral to [facility] to measure leg length discrepancy.  Handicap form was renewed today.  At this point I think she has made enough progress and her fracture is healed enough to where we can just see her back as needed.  Follow-Up Instructions: Return if symptoms worsen or fail to improve.   Orders:  Orders Placed This Encounter  Procedures  . XR FEMUR MIN 2 VIEWS LEFT   No orders of the defined types were placed in this encounter.     Procedures: No procedures performed   Clinical Data: No additional findings.   Subjective: Chief Complaint  Patient presents with  . Left Hip - Pain    Ms. Rennaker is a following up for left subtrochanteric femur fracture.  She is 6 months status post surgical fixation.  She has been making steady progress.  She feels a subjective leg length discrepancy in which the left leg feels longer.  She is still limping at times specially when she first gets up from sitting.  She has stopped physical therapy and gone back to the [facility] with a trainer.   Review of Systems  Constitutional: Negative.   HENT: Negative.   Eyes: Negative.   Respiratory: Negative.   Cardiovascular: Negative.   Endocrine: Negative.   Musculoskeletal:  Negative.   Neurological: Negative.   Hematological: Negative.   Psychiatric/Behavioral: Negative.   All other systems reviewed and are negative.    Objective: Vital Signs: There were no vitals taken for this visit.  Physical Exam Vitals and nursing note reviewed.  Constitutional:      Appearance: She is well-developed.  Pulmonary:     Effort: Pulmonary effort is normal.  Skin:    General: Skin is warm.     Capillary Refill: Capillary refill takes less than 2 seconds.  Neurological:     Mental Status: She is alert and oriented to person, place, and time.  Psychiatric:        Behavior: Behavior normal.        Thought Content: Thought content normal.        Judgment: Judgment normal.     Ortho Exam Left hip shows fully healed surgical scars.  Mild discomfort with attempted range of motion of the hip.  Ambulating with a slight limp and antalgic gait.  Hip flexor strength is improving. Specialty Comments:  No specialty comments available.  Imaging: XR FEMUR MIN 2 VIEWS LEFT  Result Date: 08/13/2020 Stable fixation of subtrochanteric femur fracture.  There has been significant fracture healing and consolidation.  No hardware complications.    PMFS History: Patient Active  Problem List   Diagnosis Date Noted  . Hypokalemia   . Drug-induced hypotension   . Hyponatremia   . Transaminitis   . Intertrochanteric fracture of left hip ([place]) 01/29/2020  . Closed nondisplaced intertrochanteric fracture of left femur ([place])   . Drug induced constipation   . Acute blood loss anemia   . Postoperative pain   . Displaced intertrochanteric fracture of left femur, initial encounter for closed fracture ([place]) 01/23/2020  . Essential hypertension 01/23/2020   Past Medical History:  Diagnosis Date  . Hypertension   . Osteopenia     History reviewed. No pertinent family history.  Past Surgical History:  Procedure Laterality Date  . CHOLECYSTECTOMY    . FRACTURE SURGERY     L hip  30 years ago  . INTRAMEDULLARY (IM) NAIL INTERTROCHANTERIC Left 01/23/2020   Procedure: INTRAMEDULLARY (IM) NAIL INTERTROCHANTRIC;  Surgeon: Leandrew Koyanagi, MD;  Location: [facility];  Service: Orthopedics;  Laterality: Left;   Social History   Occupational History  . Not on file  Tobacco Use  . Smoking status: Former Research scientist (life sciences)  . Smokeless tobacco: Never Used  Substance and Sexual Activity  . Alcohol use: Never  . Drug use: Not on file  . Sexual activity: Not on file

## 2020-10-09 ENCOUNTER — Encounter: Payer: Self-pay | Admitting: Family Medicine

## 2020-10-09 ENCOUNTER — Ambulatory Visit (INDEPENDENT_AMBULATORY_CARE_PROVIDER_SITE_OTHER): Payer: Medicare HMO | Admitting: Family Medicine

## 2020-10-09 VITALS — BP 150/92 | HR 74 | Temp 97.9°F | Resp 18 | Ht 65.0 in | Wt 191.0 lb

## 2020-10-09 DIAGNOSIS — I10 Essential (primary) hypertension: Secondary | ICD-10-CM | POA: Diagnosis not present

## 2020-10-09 DIAGNOSIS — Z780 Asymptomatic menopausal state: Secondary | ICD-10-CM

## 2020-10-09 DIAGNOSIS — M8580 Other specified disorders of bone density and structure, unspecified site: Secondary | ICD-10-CM | POA: Diagnosis not present

## 2020-10-09 MED ORDER — LOSARTAN POTASSIUM 100 MG PO TABS
ORAL_TABLET | Freq: Every day | ORAL | 1 refills | Status: DC
Start: 2020-10-09 — End: 2020-11-15

## 2020-10-09 MED ORDER — METOPROLOL SUCCINATE ER 100 MG PO TB24: 100.0000 mg | ORAL_TABLET | Freq: Every day | ORAL | 1 refills | Status: DC

## 2020-10-09 MED ORDER — CHLORTHALIDONE 50 MG PO TABS: 50.0000 mg | ORAL_TABLET | Freq: Every day | ORAL | 1 refills | Status: DC

## 2020-10-09 NOTE — Progress Notes (Signed)
New Patient Office Visit  Subjective:  Patient ID: Kimberly Whitney, female    DOB: 1950/07/21  Age: 70 y.o. MRN: [id number]  CC:  Chief Complaint  Patient presents with   Establish Care   Hypertension    HPI Kimberly Whitney presents to establish care.  She is recently moved to the [place] area.  Her previous physicians were in [facility]. Hypertension: Currently on chlorthalidone 50 mg once daily, metoprolol XL 50 mg once daily, and losartan 100 mg once daily.  Systolic blood pressure usually runs in the 150s. Osteopenia: Currently on Os-Cal D1 twice daily.  Patient had a hip fracture in September 2021 and she still has some hip discomfort. Psoriasis: Currently on no medications.  Past Medical History:  Diagnosis Date   Hypertension    Osteopenia     Past Surgical History:  Procedure Laterality Date   CHOLECYSTECTOMY     FRACTURE SURGERY     L hip 30 years ago   INTRAMEDULLARY (IM) NAIL INTERTROCHANTERIC Left 01/23/2020   Procedure: INTRAMEDULLARY (IM) NAIL INTERTROCHANTRIC;  Surgeon: Leandrew Koyanagi, MD;  Location: [facility];  Service: Orthopedics;  Laterality: Left;    Family History  Problem Relation Age of Onset   Transient ischemic attack Mother    Dementia Mother    Cancer Father    Hypertension Father    Osteoarthritis Sister    Depression Sister     Social History   Socioeconomic History   Marital status: Married    Spouse name: Not on file   Number of children: Not on file   Years of education: Not on file   Highest education level: Not on file  Occupational History   Not on file  Tobacco Use   Smoking status: Former    Pack years: 0.00   Smokeless tobacco: Never  Substance and Sexual Activity   Alcohol use: Never   Drug use: Never   Sexual activity: Not on file  Other Topics Concern   Not on file  Social History Narrative   Not on file   Social Determinants of Health   Financial Resource Strain: Not on file  Food Insecurity: Not on file   Transportation Needs: Not on file  Physical Activity: Not on file  Stress: Not on file  Social Connections: Not on file  Intimate Partner Violence: Not on file    ROS Review of Systems  Constitutional:  Negative for chills, fatigue and fever.  HENT:  Negative for congestion, ear pain, rhinorrhea and sore throat.   Respiratory:  Negative for cough and shortness of breath.   Cardiovascular:  Negative for chest pain.  Gastrointestinal:  Negative for abdominal pain, constipation, diarrhea, nausea and vomiting.  Genitourinary:  Negative for dysuria and urgency.  Musculoskeletal:  Positive for arthralgias (left hip pain). Negative for back pain and myalgias.  Neurological:  Negative for dizziness, weakness, light-headedness and headaches.  Psychiatric/Behavioral:  Negative for dysphoric mood. The patient is not nervous/anxious.    Objective:   Today's Vitals: There were no vitals taken for this visit.  Physical Exam Vitals reviewed.  Constitutional:      Appearance: Normal appearance. She is normal weight.  Neck:     Vascular: No carotid bruit.  Cardiovascular:     Rate and Rhythm: Normal rate and regular rhythm.     Pulses: Normal pulses.     Heart sounds: Normal heart sounds.  Pulmonary:     Effort: Pulmonary effort is normal. No respiratory distress.  Breath sounds: Normal breath sounds.  Abdominal:     General: Abdomen is flat. Bowel sounds are normal.     Palpations: Abdomen is soft.     Tenderness: There is no abdominal tenderness.  Neurological:     Mental Status: She is alert and oriented to person, place, and time.  Psychiatric:        Mood and Affect: Mood normal.        Behavior: Behavior normal.    Assessment & Plan:   1. Essential hypertension Increase toprol xl to 100 mg once daily.  Check labs.  - CBC with Differential/Platelet - Comprehensive metabolic panel - Lipid panel - TSH  2. Osteopenia after menopause Continue calcium with d.    Outpatient Encounter Medications as of 10/09/2020  Medication Sig   calcium-vitamin D (OSCAL WITH D) 500-200 MG-UNIT tablet Take 1 tablet by mouth 2 (two) times daily.   cholecalciferol (VITAMIN D3) 25 MCG (1000 UNIT) tablet Take 2,000 Units by mouth daily.   metoprolol succinate (TOPROL-XL) 100 MG 24 hr tablet Take 1 tablet (100 mg total) by mouth daily. Take with or immediately following a meal.   cetirizine (ZYRTEC) 10 MG tablet Take 10 mg by mouth at bedtime.   chlorthalidone (HYGROTON) 50 MG tablet Take 1 tablet (50 mg total) by mouth daily.   losartan (COZAAR) 100 MG tablet TAKE 1 TABLET (100 MG TOTAL) BY MOUTH DAILY.   magnesium oxide (MAG-OX) 400 MG tablet TAKE 1 TABLET BY MOUTH DAILY.   [DISCONTINUED] acetaminophen (TYLENOL) 325 MG tablet Take 2 tablets (650 mg total) by mouth every 6 (six) hours as needed for mild pain (or Fever >/= 101).   [DISCONTINUED] chlorthalidone (HYGROTON) 25 MG tablet TAKE 2 TABLETS (50 MG TOTAL) BY MOUTH DAILY.   [DISCONTINUED] chlorthalidone (HYGROTON) 50 MG tablet Take 1 tablet (50 mg total) by mouth daily.   [DISCONTINUED] cyclobenzaprine (FLEXERIL) 5 MG tablet TAKE 1 TABLET (5 MG TOTAL) BY MOUTH THREE TIMES DAILY AS NEEDED FOR MUSCLE SPASMS.   [DISCONTINUED] docusate sodium (COLACE) 100 MG capsule Take 1 capsule (100 mg total) by mouth 2 (two) times daily.   [DISCONTINUED] losartan (COZAAR) 100 MG tablet TAKE 1 TABLET (100 MG TOTAL) BY MOUTH DAILY.   [DISCONTINUED] magnesium 30 MG tablet Take 1 tablet (30 mg total) by mouth daily.   [DISCONTINUED] metoprolol succinate (TOPROL-XL) 50 MG 24 hr tablet TAKE 1 TABLET (50 MG TOTAL) BY MOUTH EVERY EVENING.   [DISCONTINUED] polyethylene glycol (MIRALAX / GLYCOLAX) 17 g packet Take 17 g by mouth daily.   No facility-administered encounter medications on file as of 10/09/2020.    Follow-up: Return in about 4 weeks (around 11/06/2020) for [facility] [employer] visit.   Rochel Brome, MD

## 2020-10-09 NOTE — Patient Instructions (Signed)
Increase toprol xl 100 mg once daily.

## 2020-10-10 LAB — CBC WITH DIFFERENTIAL/PLATELET
Basophils Absolute: 0.1 10*3/uL (ref 0.0–0.2)
Basos: 1 %
EOS (ABSOLUTE): 0.6 10*3/uL — ABNORMAL HIGH (ref 0.0–0.4)
Eos: 8 %
Hematocrit: 43.7 % (ref 34.0–46.6)
Hemoglobin: 14.7 g/dL (ref 11.1–15.9)
Immature Grans (Abs): 0 10*3/uL (ref 0.0–0.1)
Immature Granulocytes: 0 %
Lymphocytes Absolute: 1.6 10*3/uL (ref 0.7–3.1)
Lymphs: 22 %
MCH: 31.4 pg (ref 26.6–33.0)
MCHC: 33.6 g/dL (ref 31.5–35.7)
MCV: 93 fL (ref 79–97)
Monocytes Absolute: 0.6 10*3/uL (ref 0.1–0.9)
Monocytes: 9 %
Neutrophils Absolute: 4.3 10*3/uL (ref 1.4–7.0)
Neutrophils: 60 %
Platelets: 259 10*3/uL (ref 150–450)
RBC: 4.68 x10E6/uL (ref 3.77–5.28)
RDW: 12.7 % (ref 11.7–15.4)
WBC: 7.3 10*3/uL (ref 3.4–10.8)

## 2020-10-10 LAB — COMPREHENSIVE METABOLIC PANEL
ALT: 22 IU/L (ref 0–32)
AST: 21 IU/L (ref 0–40)
Albumin/Globulin Ratio: 1.8 (ref 1.2–2.2)
Albumin: 4.6 g/dL (ref 3.8–4.8)
Alkaline Phosphatase: 88 IU/L (ref 44–121)
BUN/Creatinine Ratio: 18 (ref 12–28)
BUN: 14 mg/dL (ref 8–27)
Bilirubin Total: 0.5 mg/dL (ref 0.0–1.2)
CO2: 25 mmol/L (ref 20–29)
Calcium: 9.9 mg/dL (ref 8.7–10.3)
Chloride: 100 mmol/L (ref 96–106)
Creatinine, Ser: 0.8 mg/dL (ref 0.57–1.00)
Globulin, Total: 2.5 g/dL (ref 1.5–4.5)
Glucose: 103 mg/dL — ABNORMAL HIGH (ref 65–99)
Potassium: 4.1 mmol/L (ref 3.5–5.2)
Sodium: 141 mmol/L (ref 134–144)
Total Protein: 7.1 g/dL (ref 6.0–8.5)
eGFR: 80 mL/min/{1.73_m2} (ref >59)

## 2020-10-10 LAB — LIPID PANEL
Chol/HDL Ratio: 3.3 ratio (ref 0.0–4.4)
Cholesterol, Total: 190 mg/dL (ref 100–199)
HDL: 58 mg/dL (ref >39)
LDL Chol Calc (NIH): 116 mg/dL — ABNORMAL HIGH (ref 0–99)
Triglycerides: 87 mg/dL (ref 0–149)
VLDL Cholesterol Cal: 16 mg/dL (ref 5–40)

## 2020-10-10 LAB — CARDIOVASCULAR RISK ASSESSMENT

## 2020-10-10 LAB — TSH: TSH: 1.66 u[IU]/mL (ref 0.450–4.500)

## 2020-10-12 ENCOUNTER — Encounter: Payer: Self-pay | Admitting: Family Medicine

## 2020-10-14 LAB — SPECIMEN STATUS REPORT

## 2020-10-14 LAB — HGB A1C W/O EAG: Hgb A1c MFr Bld: 5.5 % (ref 4.8–5.6)

## 2020-11-12 ENCOUNTER — Ambulatory Visit (INDEPENDENT_AMBULATORY_CARE_PROVIDER_SITE_OTHER): Payer: Medicare HMO | Admitting: Family Medicine

## 2020-11-12 ENCOUNTER — Other Ambulatory Visit: Payer: Self-pay

## 2020-11-12 ENCOUNTER — Encounter: Payer: Self-pay | Admitting: Family Medicine

## 2020-11-12 VITALS — BP 128/68 | HR 72 | Temp 97.1°F | Resp 16 | Ht 65.0 in | Wt 185.6 lb

## 2020-11-12 DIAGNOSIS — Z1231 Encounter for screening mammogram for malignant neoplasm of breast: Secondary | ICD-10-CM

## 2020-11-12 DIAGNOSIS — Z0000 Encounter for general adult medical examination without abnormal findings: Secondary | ICD-10-CM | POA: Diagnosis not present

## 2020-11-12 DIAGNOSIS — Z6830 Body mass index (BMI) 30.0-30.9, adult: Secondary | ICD-10-CM | POA: Diagnosis not present

## 2020-11-12 NOTE — Patient Instructions (Signed)
Preventive Care 69 Years and Older, Female Preventive care refers to lifestyle choices and visits with your health care provider that can promote health and wellness. This includes: A yearly physical exam. This is also called an annual wellness visit. Regular dental and eye exams. Immunizations. Screening for certain conditions. Healthy lifestyle choices, such as: Eating a healthy diet. Getting regular exercise. Not using drugs or products that contain nicotine and tobacco. Limiting alcohol use. What can I expect for my preventive care visit? Physical exam Your health care provider will check your: Height and weight. These may be used to calculate your BMI (body mass index). BMI is a measurement that tells if you are at a healthy weight. Heart rate and blood pressure. Body temperature. Skin for abnormal spots. Counseling Your health care provider may ask you questions about your: Past medical problems. Family's medical history. Alcohol, tobacco, and drug use. Emotional well-being. Home life and relationship well-being. Sexual activity. Diet, exercise, and sleep habits. History of falls. Memory and ability to understand (cognition). Work and work Statistician. Pregnancy and menstrual history. Access to firearms. What immunizations do I need?  Vaccines are usually given at various ages, according to a schedule. Your health care provider will recommend vaccines for you based on your age, medicalhistory, and lifestyle or other factors, such as travel or where you work. What tests do I need? Blood tests Lipid and cholesterol levels. These may be checked every 5 years, or more often depending on your overall health. Hepatitis C test. Hepatitis B test. Screening Lung cancer screening. You may have this screening every year starting at age 44 if you have a 30-pack-year history of smoking and currently smoke or have quit within the past 15 years. Colorectal cancer screening. All  adults should have this screening starting at age 39 and continuing until age 65. Your health care provider may recommend screening at age 61 if you are at increased risk. You will have tests every 1-10 years, depending on your results and the type of screening test. Diabetes screening. This is done by checking your blood sugar (glucose) after you have not eaten for a while (fasting). You may have this done every 1-3 years. Mammogram. This may be done every 1-2 years. Talk with your health care provider about how often you should have regular mammograms. Abdominal aortic aneurysm (AAA) screening. You may need this if you are a current or former smoker. BRCA-related cancer screening. This may be done if you have a family history of breast, ovarian, tubal, or peritoneal cancers. Other tests STD (sexually transmitted disease) testing, if you are at risk. Bone density scan. This is done to screen for osteoporosis. You may have this done starting at age 54. Talk with your health care provider about your test results, treatment options,and if necessary, the need for more tests. Follow these instructions at home: Eating and drinking  Eat a diet that includes fresh fruits and vegetables, whole grains, lean protein, and low-fat dairy products. Limit your intake of foods with high amounts of sugar, saturated fats, and salt. Take vitamin and mineral supplements as recommended by your health care provider. Do not drink alcohol if your health care provider tells you not to drink. If you drink alcohol: Limit how much you have to 0-1 drink a day. Be aware of how much alcohol is in your drink. In the [place] one drink equals one 12 oz bottle of beer (355 mL), one 5 oz glass of wine (148 mL), or one 1  oz glass of hard liquor (44 mL).  Lifestyle Take daily care of your teeth and gums. Brush your teeth every morning and night with fluoride toothpaste. Floss one time each day. Stay active. Exercise for at  least 30 minutes 5 or more days each week. Do not use any products that contain nicotine or tobacco, such as cigarettes, e-cigarettes, and chewing tobacco. If you need help quitting, ask your health care provider. Do not use drugs. If you are sexually active, practice safe sex. Use a condom or other form of protection in order to prevent STIs (sexually transmitted infections). Talk with your health care provider about taking a low-dose aspirin or statin. Find healthy ways to cope with stress, such as: Meditation, yoga, or listening to music. Journaling. Talking to a trusted person. Spending time with friends and family. Safety Always wear your seat belt while driving or riding in a vehicle. Do not drive: If you have been drinking alcohol. Do not ride with someone who has been drinking. When you are tired or distracted. While texting. Wear a helmet and other protective equipment during sports activities. If you have firearms in your house, make sure you follow all gun safety procedures. What's next? Visit your health care provider once a year for an annual wellness visit. Ask your health care provider how often you should have your eyes and teeth checked. Stay up to date on all vaccines. This information is not intended to replace advice given to you by your health care provider. Make sure you discuss any questions you have with your healthcare provider. Document Revised: 04/08/2020 Document Reviewed: 04/12/2018 Elsevier Patient Education  2022 [employer].

## 2020-11-12 NOTE — Progress Notes (Signed)
Subjective:  Patient ID: Kimberly Whitney, female    DOB: [birth year]  Age: 70 y.o. MRN: [id number]  Chief Complaint  Patient presents with   Annual Exam    HPI Encounter for general adult medical examination without abnormal findings  Physical ("At Risk" items are starred): Patient's last physical exam was 1 year ago .  Smoking: Life-long non-smoker ;  Physical Activity: Exercises at least 4 times per week ;  Eating healthy.Lost weight since last visit.  Alcohol/Drug Use: Is a non-drinker ; No illicit drug use ;  Patient is not afflicted from Stress Incontinence and Urge Incontinence  Safety: reviewed ; Patient wears a seat belt, has smoke detectors, has carbon monoxide detectors, practices appropriate gun safety, and wears sunscreen with extended sun exposure. Dental Care: biannual cleanings, brushes and flosses daily. Ophthalmology/Optometry: Annual visit.  Hearing loss: none Vision impairments: none  Due for mammogram in 12/2020. Dexa was done in 01/2020.  Fall Risk  11/15/2020 10/09/2020  Falls in the past year? - 1  Number falls in past yr: - 1  Injury with Fall? - 1  Risk for fall due to : No Fall Risks -  Follow up Falls evaluation completed -     Depression screen [facility] 2/9 11/15/2020 10/09/2020  Decreased Interest 0 0  Down, Depressed, Hopeless 0 0  PHQ - 2 Score 0 0       Functional Status Survey: Is the patient deaf or have difficulty hearing?: No Does the patient have difficulty seeing, even when wearing glasses/contacts?: Yes (Eye exam July 2021) Does the patient have difficulty concentrating, remembering, or making decisions?: No Does the patient have difficulty walking or climbing stairs?: No Does the patient have difficulty dressing or bathing?: No Does the patient have difficulty doing errands alone such as visiting a doctor's office or shopping?: No   Current Exercise Habits: Structured exercise class, Time (Minutes): > 60, Frequency (Times/Week): 4, Weekly  Exercise (Minutes/Week): 0     Social Hx   Social History   Socioeconomic History   Marital status: Married    Spouse name: Not on file   Number of children: Not on file   Years of education: Not on file   Highest education level: Not on file  Occupational History   Not on file  Tobacco Use   Smoking status: Former   Smokeless tobacco: Never  Substance and Sexual Activity   Alcohol use: Never   Drug use: Never   Sexual activity: Not on file  Other Topics Concern   Not on file  Social History Narrative   Not on file   Social Determinants of Health   Financial Resource Strain: Not on file  Food Insecurity: Not on file  Transportation Needs: Not on file  Physical Activity: Sufficiently Active   Days of Exercise per Week: 4 days   Minutes of Exercise per Session: 60 min  Stress: Not on file  Social Connections: Not on file   Past Medical History:  Diagnosis Date   Hypertension    Osteopenia    Family History  Problem Relation Age of Onset   Transient ischemic attack Mother    Dementia Mother    Cancer Father    Hypertension Father    Osteoarthritis Sister    Depression Sister     Review of Systems  Constitutional:  Negative for chills, fatigue and fever.  HENT:  Negative for congestion, ear pain and sore throat.   Respiratory:  Negative for cough and shortness  of breath.   Cardiovascular:  Negative for chest pain.  Gastrointestinal:  Negative for abdominal pain, constipation, diarrhea, nausea and vomiting.  Genitourinary:  Negative for dysuria and urgency.  Musculoskeletal:  Negative for arthralgias and myalgias.  Skin:  Negative for rash.  Neurological:  Negative for dizziness and headaches.  Psychiatric/Behavioral:  Negative for dysphoric mood. The patient is not nervous/anxious.     Objective:  BP 128/68   Pulse 72   Temp (!) 97.1 F (36.2 C)   Resp 16   Ht 5\' 5"  (1.651 m)   Wt 185 lb 9.6 oz (84.2 kg)   BMI 30.89 kg/m   BP/Weight 11/12/2020  10/09/2020 1/74/9449  Systolic BP 675 916 384  Diastolic BP 68 92 69  Wt. (Lbs) 185.6 191 -  BMI 30.89 31.78 -    Physical Exam Vitals reviewed.  Constitutional:      Appearance: Normal appearance. She is normal weight.  Neck:     Vascular: No carotid bruit.  Cardiovascular:     Rate and Rhythm: Normal rate and regular rhythm.     Heart sounds: Normal heart sounds.  Pulmonary:     Effort: Pulmonary effort is normal. No respiratory distress.     Breath sounds: Normal breath sounds.  Abdominal:     General: Abdomen is flat. Bowel sounds are normal.     Palpations: Abdomen is soft.     Tenderness: There is no abdominal tenderness.  Neurological:     Mental Status: She is alert and oriented to person, place, and time.  Psychiatric:        Mood and Affect: Mood normal.        Behavior: Behavior normal.    Lab Results  Component Value Date   WBC 7.3 10/09/2020   HGB 14.7 10/09/2020   HCT 43.7 10/09/2020   PLT 259 10/09/2020   GLUCOSE 103 (H) 10/09/2020   CHOL 190 10/09/2020   TRIG 87 10/09/2020   HDL 58 10/09/2020   LDLCALC 116 (H) 10/09/2020   ALT 22 10/09/2020   AST 21 10/09/2020   NA 141 10/09/2020   K 4.1 10/09/2020   CL 100 10/09/2020   CREATININE 0.80 10/09/2020   BUN 14 10/09/2020   CO2 25 10/09/2020   TSH 1.660 10/09/2020   INR 1.0 01/23/2020   HGBA1C 5.5 10/09/2020      Assessment & Plan:  1. General medical exam 2. Encounter for mammogram to establish baseline mammogram Ordering a mammogram  3. BMI 30.0-30.9,adult  Recommend continue to work on eating healthy diet and exercise.  These are the goals we discussed:  Goals       Advanced Care Planning : pt to work on at home      [place] (pt-stated)      To improve cholesterol.      DIET - REDUCE SUGAR INTAKE (pt-stated)      Improve sugars   Recommend consider covid 19...education given. Recommend shingrix vaccine.[facility] Kitchenabdominal tenderness pharmacy.  Consider hepatitis C  screening at her next blood draw.      This is a list of the screening recommended for you and due dates:  Health Maintenance  Topic Date Due   Hepatitis C Screening: USPSTF Recommendation to screen - Ages 90-79 yo.  Never done   Zoster (Shingles) Vaccine (1 of 2) Never done   COVID-19 Vaccine (1) 12/01/2020*   Flu Shot  11/30/2020   Mammogram  01/20/2022   Colon Cancer Screening  09/08/2026  Tetanus Vaccine  10/29/2028   DEXA scan (bone density measurement)  Completed   Pneumonia vaccines  Completed   HPV Vaccine  Aged Out  *Topic was postponed. The date shown is not the original due date.     AN INDIVIDUALIZED CARE PLAN: was established or reinforced today.   SELF MANAGEMENT: The patient and I together assessed ways to personally work towards obtaining the recommended goals  Support needs The patient and/or family needs were assessed and services were offered and not necessary at this time.    Follow-up: Return in about 3 months (around 02/12/2021) for fasting.  Rochel Brome, MD [facility] [phone]

## 2020-11-15 ENCOUNTER — Encounter: Payer: Self-pay | Admitting: Family Medicine

## 2021-02-14 ENCOUNTER — Encounter: Payer: Self-pay | Admitting: Family Medicine

## 2021-02-15 ENCOUNTER — Other Ambulatory Visit: Payer: Self-pay

## 2021-02-15 DIAGNOSIS — Z1231 Encounter for screening mammogram for malignant neoplasm of breast: Secondary | ICD-10-CM

## 2021-02-16 ENCOUNTER — Other Ambulatory Visit: Payer: Self-pay | Admitting: Family Medicine

## 2021-02-16 DIAGNOSIS — Z1231 Encounter for screening mammogram for malignant neoplasm of breast: Secondary | ICD-10-CM

## 2021-02-18 DIAGNOSIS — D485 Neoplasm of uncertain behavior of skin: Secondary | ICD-10-CM | POA: Diagnosis not present

## 2021-02-18 DIAGNOSIS — D1801 Hemangioma of skin and subcutaneous tissue: Secondary | ICD-10-CM | POA: Diagnosis not present

## 2021-02-18 DIAGNOSIS — L570 Actinic keratosis: Secondary | ICD-10-CM | POA: Diagnosis not present

## 2021-02-18 DIAGNOSIS — D2239 Melanocytic nevi of other parts of face: Secondary | ICD-10-CM | POA: Diagnosis not present

## 2021-02-18 DIAGNOSIS — L814 Other melanin hyperpigmentation: Secondary | ICD-10-CM | POA: Diagnosis not present

## 2021-02-18 DIAGNOSIS — D225 Melanocytic nevi of trunk: Secondary | ICD-10-CM | POA: Diagnosis not present

## 2021-02-22 ENCOUNTER — Ambulatory Visit
Admission: RE | Admit: 2021-02-22 | Discharge: 2021-02-22 | Disposition: A | Discharge status: Home / Self Care | Payer: Medicare HMO | Source: Ambulatory Visit | Attending: Family Medicine | Admitting: Family Medicine

## 2021-02-22 ENCOUNTER — Other Ambulatory Visit: Payer: Self-pay

## 2021-02-22 DIAGNOSIS — Z1231 Encounter for screening mammogram for malignant neoplasm of breast: Secondary | ICD-10-CM | POA: Diagnosis not present

## 2021-02-23 DIAGNOSIS — H2513 Age-related nuclear cataract, bilateral: Secondary | ICD-10-CM | POA: Diagnosis not present

## 2021-03-02 ENCOUNTER — Encounter: Payer: Self-pay | Admitting: Family Medicine

## 2021-03-02 ENCOUNTER — Other Ambulatory Visit: Payer: Self-pay

## 2021-03-02 ENCOUNTER — Ambulatory Visit (INDEPENDENT_AMBULATORY_CARE_PROVIDER_SITE_OTHER): Payer: Medicare HMO | Admitting: Family Medicine

## 2021-03-02 VITALS — BP 131/78 | HR 60 | Temp 97.2°F | Resp 16 | Ht 65.0 in | Wt 181.0 lb

## 2021-03-02 DIAGNOSIS — R590 Localized enlarged lymph nodes: Secondary | ICD-10-CM | POA: Diagnosis not present

## 2021-03-02 DIAGNOSIS — R69 Illness, unspecified: Secondary | ICD-10-CM | POA: Diagnosis not present

## 2021-03-02 DIAGNOSIS — J301 Allergic rhinitis due to pollen: Secondary | ICD-10-CM | POA: Diagnosis not present

## 2021-03-02 DIAGNOSIS — C44729 Squamous cell carcinoma of skin of left lower limb, including hip: Secondary | ICD-10-CM

## 2021-03-02 DIAGNOSIS — M8580 Other specified disorders of bone density and structure, unspecified site: Secondary | ICD-10-CM | POA: Diagnosis not present

## 2021-03-02 DIAGNOSIS — I10 Essential (primary) hypertension: Secondary | ICD-10-CM

## 2021-03-02 DIAGNOSIS — F411 Generalized anxiety disorder: Secondary | ICD-10-CM | POA: Insufficient documentation

## 2021-03-02 DIAGNOSIS — R053 Chronic cough: Secondary | ICD-10-CM | POA: Insufficient documentation

## 2021-03-02 DIAGNOSIS — F418 Other specified anxiety disorders: Secondary | ICD-10-CM

## 2021-03-02 DIAGNOSIS — Z780 Asymptomatic menopausal state: Secondary | ICD-10-CM | POA: Diagnosis not present

## 2021-03-02 MED ORDER — DIAZEPAM 2 MG PO TABS: 2.0000 mg | ORAL_TABLET | Freq: Two times a day (BID) | ORAL | 0 refills | Status: DC | PRN

## 2021-03-02 NOTE — Progress Notes (Signed)
Subjective:  Patient ID: Kimberly Whitney, female    DOB: [birth year]  Age: 70 y.o. MRN: [id number]  Chief Complaint  Patient presents with   Anxiety     HPI Pt is going on vacation to [place] and has issues with claustrophobia. Has taken valium occasionally before medical procedures.  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 Going back for further excision. Saw Timoteo Gaul, NP. Pt also needs cataract surgery. She is stressed out due to all of above. Pt is also complaining of persistent cough for 6-9 months. Lymph nodes are swollen, but have been this way. Had covid 19 in January 2022 and had a cold in April 2022.   Current Outpatient Medications on File Prior to Visit  Medication Sig Dispense Refill   calcium-vitamin D (OSCAL WITH D) 500-200 MG-UNIT tablet Take 1 tablet by mouth 2 (two) times daily.     cetirizine (ZYRTEC) 10 MG tablet Take by mouth.     chlorthalidone (HYGROTON) 50 MG tablet Take 1 tablet (50 mg total) by mouth daily. 90 tablet 1   cholecalciferol (VITAMIN D3) 25 MCG (1000 UNIT) tablet Take 2,000 Units by mouth daily.     losartan (COZAAR) 100 MG tablet Take 1 tablet by mouth daily.     metoprolol succinate (TOPROL-XL) 100 MG 24 hr tablet Take 1 tablet (100 mg total) by mouth daily. Take with or immediately following a meal. 90 tablet 1   No current facility-administered medications on file prior to visit.   Past Medical History:  Diagnosis Date   Cancer [facility])    Closed nondisplaced intertrochanteric fracture of left femur ([place])    Hypertension    Intertrochanteric fracture of left hip ([place]) 01/29/2020   Osteopenia    Squamous cell carcinoma of leg, left    Past Surgical History:  Procedure Laterality Date   BREAST EXCISIONAL BIOPSY Right    years ago   CHOLECYSTECTOMY     FRACTURE SURGERY     L hip 30 years ago   INTRAMEDULLARY (IM) NAIL INTERTROCHANTERIC Left 01/23/2020   Procedure: INTRAMEDULLARY (IM) NAIL INTERTROCHANTRIC;  Surgeon: Leandrew Koyanagi, MD;   Location: [facility];  Service: Orthopedics;  Laterality: Left;    Family History  Problem Relation Age of Onset   Transient ischemic attack Mother    Dementia Mother    Cancer Father    Hypertension Father    Osteoarthritis Sister    Depression Sister    Melanoma Brother    Basal cell carcinoma Brother    Squamous cell carcinoma Brother    Breast cancer Maternal Aunt    Breast cancer Maternal Aunt    Breast cancer Paternal Aunt    Pancreatic cancer Paternal Uncle    Esophageal cancer Other    Social History   Socioeconomic History   Marital status: Married    Spouse name: Not on file   Number of children: Not on file   Years of education: Not on file   Highest education level: Not on file  Occupational History   Not on file  Tobacco Use   Smoking status: Former   Smokeless tobacco: Never  Substance and Sexual Activity   Alcohol use: Never   Drug use: Never   Sexual activity: Not on file  Other Topics Concern   Not on file  Social History Narrative   Not on file   Social Determinants of Health   Financial Resource Strain: Not on file  Food Insecurity: Not on file  Transportation Needs: Not on file  Physical Activity: Sufficiently Active   Days of Exercise per Week: 4 days   Minutes of Exercise per Session: 60 min  Stress: Not on file  Social Connections: Not on file    Review of Systems  Constitutional:  Negative for fatigue and fever.  HENT:  Positive for congestion, rhinorrhea and sore throat.   Respiratory:  Positive for cough (6-9 months (dry cough)).   Gastrointestinal:  Negative for abdominal pain, constipation, diarrhea, nausea and vomiting.  Musculoskeletal:  Negative for arthralgias, back pain and gait problem.  Skin:        Squamous cell carcinoma left leg   Psychiatric/Behavioral:  Negative for dysphoric mood. The patient is nervous/anxious.     Objective:  BP 131/78   Pulse 60   Temp (!) 97.2 F (36.2 C)   Resp 16   Ht 5\' 5"  (1.651 m)   Wt  181 lb (82.1 kg)   BMI 30.12 kg/m   BP/Weight 03/02/2021 11/12/2020 5/95/6387  Systolic BP 564 332 951  Diastolic BP 78 68 92  Wt. (Lbs) 181 185.6 191  BMI 30.12 30.89 31.78    Physical Exam Vitals reviewed.  Constitutional:      Appearance: Normal appearance. She is normal weight.  HENT:     Right Ear: Tympanic membrane, ear canal and external ear normal.     Left Ear: Tympanic membrane, ear canal and external ear normal.     Nose: Congestion present.     Comments: Deviated septum.    Mouth/Throat:     Pharynx: Posterior oropharyngeal erythema present.  Cardiovascular:     Rate and Rhythm: Normal rate and regular rhythm.     Heart sounds: Normal heart sounds. No murmur heard. Pulmonary:     Effort: Pulmonary effort is normal. No respiratory distress.     Breath sounds: Normal breath sounds.  Abdominal:     General: Abdomen is flat. Bowel sounds are normal.     Palpations: Abdomen is soft.     Tenderness: There is no abdominal tenderness.  Lymphadenopathy:     Cervical: Cervical adenopathy present.  Skin:    Comments: Left upper lateral lower leg - scab.  Neurological:     Mental Status: She is alert and oriented to person, place, and time.  Psychiatric:        Behavior: Behavior normal.     Comments: anxious    Diabetic Foot Exam - Simple   No data filed      Lab Results  Component Value Date   WBC 7.3 10/09/2020   HGB 14.7 10/09/2020   HCT 43.7 10/09/2020   PLT 259 10/09/2020   GLUCOSE 103 (H) 10/09/2020   CHOL 190 10/09/2020   TRIG 87 10/09/2020   HDL 58 10/09/2020   LDLCALC 116 (H) 10/09/2020   ALT 22 10/09/2020   AST 21 10/09/2020   NA 141 10/09/2020   K 4.1 10/09/2020   CL 100 10/09/2020   CREATININE 0.80 10/09/2020   BUN 14 10/09/2020   CO2 25 10/09/2020   TSH 1.660 10/09/2020   INR 1.0 01/23/2020   HGBA1C 5.5 10/09/2020      Assessment & Plan:   Problem List Items Addressed This Visit       Cardiovascular and Mediastinum    Essential hypertension    The current medical regimen is effective;  continue present plan and medications. Continue metoprolol XL 100 mg once daily, losartan 100 mg once daily,and chlorthalidone 50 mg  once daily.         Respiratory   Allergic rhinitis due to pollen    Continue on zyrtec.         Musculoskeletal and Integument   Osteopenia after menopause    Recommend calcium with D one twice a day.       Squamous cell carcinoma, leg, left    Follow up with dermatology      Relevant Medications   diazepam (VALIUM) 2 MG tablet     Immune and Lymphatic   Lymphadenopathy, cervical    Symmetrically mildly enlarged BL anterior cervical lymphadenopathy.         Other   Chronic cough - Primary    Order cxr.      Relevant Orders   DG Chest 2 View   Other specified anxiety disorders    Rx: Diazepam 2 mg one twice daily prn severe anxiety.      Relevant Medications   diazepam (VALIUM) 2 MG tablet  .  Meds ordered this encounter  Medications   diazepam (VALIUM) 2 MG tablet    Sig: Take 1 tablet (2 mg total) by mouth 2 (two) times daily as needed for anxiety.    Dispense:  30 tablet    Refill:  0    Orders Placed This Encounter  Procedures   DG Chest 2 View     Follow-up: Return in about 3 months (around 05/20/2021) for chronic fasting.  An After Visit Summary was printed and given to the patient.  Rochel Brome, MD [facility] [phone]

## 2021-03-02 NOTE — Patient Instructions (Addendum)
Change zyrtec to allegra or claritin.  Cough, Adult A cough helps to clear your throat and lungs. A cough may be a sign of an illness or another medical condition. An acute cough may only last 2-3 weeks, while a chronic cough may last 8 or more weeks. Many things can cause a cough. They include: Germs (viruses or bacteria) that attack the airway. Breathing in things that bother (irritate) your lungs. Allergies. Asthma. Mucus that runs down the back of your throat (postnasal drip). Smoking. Acid backing up from the stomach into the tube that moves food from the mouth to the stomach (gastroesophageal reflux). Some medicines. Lung problems. Other medical conditions, such as heart failure or a blood clot in the lung (pulmonary embolism). Follow these instructions at home: Medicines Take over-the-counter and prescription medicines only as told by your doctor. Talk with your doctor before you take medicines that stop a cough (cough suppressants). Lifestyle  Do not smoke, and try not to be around smoke. Do not use any products that contain nicotine or tobacco, such as cigarettes, e-cigarettes, and chewing tobacco. If you need help quitting, ask your doctor. Drink enough fluid to keep your pee (urine) pale yellow. Avoid caffeine. Do not drink alcohol if your doctor tells you not to drink. General instructions  Watch for any changes in your cough. Tell your doctor about them. Always cover your mouth when you cough. Stay away from things that make you cough, such as perfume, candles, campfire smoke, or cleaning products. If the air is dry, use a cool mist vaporizer or humidifier in your home. If your cough is worse at night, try using extra pillows to raise your head up higher while you sleep. Rest as needed. Keep all follow-up visits as told by your doctor. This is important. Contact a doctor if: You have new symptoms. You cough up pus. Your cough does not get better after 2-3 weeks, or  your cough gets worse. Cough medicine does not help your cough and you are not sleeping well. You have pain that gets worse or pain that is not helped with medicine. You have a fever. You are losing weight and you do not know why. You have night sweats. Get help right away if: You cough up blood. You have trouble breathing. Your heartbeat is very fast. These symptoms may be an emergency. Do not wait to see if the symptoms will go away. Get medical help right away. Call your local emergency services (911 in the [place] Do not drive yourself to the hospital. Summary A cough helps to clear your throat and lungs. Many things can cause a cough. Take over-the-counter and prescription medicines only as told by your doctor. Always cover your mouth when you cough. Contact a doctor if you have new symptoms or you have a cough that does not get better or gets worse. This information is not intended to replace advice given to you by your health care provider. Make sure you discuss any questions you have with your health care provider. Document Revised: 06/07/2019 Document Reviewed: 05/07/2018 Elsevier Patient Education  [place].

## 2021-03-03 DIAGNOSIS — H35371 Puckering of macula, right eye: Secondary | ICD-10-CM | POA: Diagnosis not present

## 2021-03-03 DIAGNOSIS — H2513 Age-related nuclear cataract, bilateral: Secondary | ICD-10-CM | POA: Diagnosis not present

## 2021-03-03 DIAGNOSIS — H2511 Age-related nuclear cataract, right eye: Secondary | ICD-10-CM | POA: Diagnosis not present

## 2021-03-03 DIAGNOSIS — I10 Essential (primary) hypertension: Secondary | ICD-10-CM | POA: Diagnosis not present

## 2021-03-04 ENCOUNTER — Encounter: Payer: Self-pay | Admitting: Family Medicine

## 2021-03-04 DIAGNOSIS — R053 Chronic cough: Secondary | ICD-10-CM | POA: Diagnosis not present

## 2021-03-04 DIAGNOSIS — H269 Unspecified cataract: Secondary | ICD-10-CM | POA: Insufficient documentation

## 2021-03-04 DIAGNOSIS — R059 Cough, unspecified: Secondary | ICD-10-CM | POA: Diagnosis not present

## 2021-03-04 DIAGNOSIS — C44729 Squamous cell carcinoma of skin of left lower limb, including hip: Secondary | ICD-10-CM | POA: Insufficient documentation

## 2021-03-04 NOTE — Assessment & Plan Note (Signed)
Follow up with dermatology.

## 2021-03-04 NOTE — Assessment & Plan Note (Signed)
Recommend calcium with D one twice a day.

## 2021-03-04 NOTE — Assessment & Plan Note (Signed)
Symmetrically mildly enlarged BL anterior cervical lymphadenopathy.

## 2021-03-04 NOTE — Assessment & Plan Note (Signed)
Rx: Diazepam 2 mg one twice daily prn severe anxiety.

## 2021-03-04 NOTE — Assessment & Plan Note (Signed)
Continue on zyrtec.

## 2021-03-04 NOTE — Assessment & Plan Note (Signed)
Order cxr. 

## 2021-03-04 NOTE — Assessment & Plan Note (Addendum)
The current medical regimen is effective;  continue present plan and medications. Continue metoprolol XL 100 mg once daily, losartan 100 mg once daily,and chlorthalidone 50 mg once daily.

## 2021-03-05 DIAGNOSIS — D0472 Carcinoma in situ of skin of left lower limb, including hip: Secondary | ICD-10-CM | POA: Diagnosis not present

## 2021-03-09 ENCOUNTER — Telehealth: Payer: Self-pay

## 2021-03-09 NOTE — Telephone Encounter (Signed)
Patient was approved for the Diazepam 2mg  through patient's insurance.

## 2021-03-18 DIAGNOSIS — L820 Inflamed seborrheic keratosis: Secondary | ICD-10-CM | POA: Diagnosis not present

## 2021-04-22 ENCOUNTER — Other Ambulatory Visit: Payer: Self-pay | Admitting: Family Medicine

## 2021-04-22 DIAGNOSIS — I10 Essential (primary) hypertension: Secondary | ICD-10-CM

## 2021-05-02 HISTORY — PX: OTHER SURGICAL HISTORY: SHX169

## 2021-05-06 DIAGNOSIS — L219 Seborrheic dermatitis, unspecified: Secondary | ICD-10-CM | POA: Insufficient documentation

## 2021-05-13 DIAGNOSIS — H52201 Unspecified astigmatism, right eye: Secondary | ICD-10-CM | POA: Diagnosis not present

## 2021-05-13 DIAGNOSIS — H2511 Age-related nuclear cataract, right eye: Secondary | ICD-10-CM | POA: Diagnosis not present

## 2021-05-14 DIAGNOSIS — H2512 Age-related nuclear cataract, left eye: Secondary | ICD-10-CM | POA: Diagnosis not present

## 2021-05-14 DIAGNOSIS — H25042 Posterior subcapsular polar age-related cataract, left eye: Secondary | ICD-10-CM | POA: Diagnosis not present

## 2021-05-14 DIAGNOSIS — H25012 Cortical age-related cataract, left eye: Secondary | ICD-10-CM | POA: Diagnosis not present

## 2021-05-19 ENCOUNTER — Other Ambulatory Visit: Payer: Self-pay | Admitting: Family Medicine

## 2021-05-19 DIAGNOSIS — I10 Essential (primary) hypertension: Secondary | ICD-10-CM

## 2021-05-20 ENCOUNTER — Other Ambulatory Visit: Payer: Self-pay

## 2021-05-20 ENCOUNTER — Encounter: Payer: Self-pay | Admitting: Family Medicine

## 2021-05-20 ENCOUNTER — Ambulatory Visit (INDEPENDENT_AMBULATORY_CARE_PROVIDER_SITE_OTHER): Payer: Medicare HMO | Admitting: Family Medicine

## 2021-05-20 VITALS — BP 110/64 | HR 52 | Temp 97.1°F | Resp 16 | Ht 65.0 in | Wt 180.6 lb

## 2021-05-20 DIAGNOSIS — K219 Gastro-esophageal reflux disease without esophagitis: Secondary | ICD-10-CM | POA: Diagnosis not present

## 2021-05-20 DIAGNOSIS — E782 Mixed hyperlipidemia: Secondary | ICD-10-CM | POA: Diagnosis not present

## 2021-05-20 DIAGNOSIS — I10 Essential (primary) hypertension: Secondary | ICD-10-CM

## 2021-05-20 MED ORDER — CHLORTHALIDONE 50 MG PO TABS: 50.0000 mg | ORAL_TABLET | Freq: Every day | ORAL | 2 refills | Status: DC

## 2021-05-20 MED ORDER — FAMOTIDINE 40 MG PO TABS: 40.0000 mg | ORAL_TABLET | Freq: Every day | ORAL | 0 refills | Status: DC

## 2021-05-20 MED ORDER — METOPROLOL SUCCINATE ER 100 MG PO TB24: ORAL_TABLET | ORAL | 2 refills | Status: DC

## 2021-05-20 MED ORDER — LOSARTAN POTASSIUM 100 MG PO TABS: 100.0000 mg | ORAL_TABLET | Freq: Every day | ORAL | 2 refills | Status: DC

## 2021-05-20 NOTE — Progress Notes (Signed)
Subjective:  Patient ID: Kimberly Whitney, female    DOB: January 06, 1951  Age: 71 y.o. MRN: [id number]  Chief Complaint  Patient presents with   Hypertension   HPI Hyperlipidemia: Patient is not taking any medication for cholesterol,  however she eats low fat diet.  Hypertension: Current medications: toprol xl 100 mg  daily, losartan 100 mg daily, chlorthalidone 50 mg daily.  Diet: healthy Exercise:active   Allergies: loratidine 10 mg daily.  Psoriasis: VTAMA 1% cream.  Flare up of seborrheic dermatitis. Just saw dermatology 2 weeks ago.   Current Outpatient Medications on File Prior to Visit  Medication Sig Dispense Refill   Besifloxacin HCl (BESIVANCE) 0.6 % SUSP Apply to eye.     Bromfenac Sodium 0.07 % SOLN Apply to eye daily.     calcium-vitamin D (OSCAL WITH D) 500-200 MG-UNIT tablet Take 1 tablet by mouth 2 (two) times daily.     cholecalciferol (VITAMIN D3) 25 MCG (1000 UNIT) tablet Take 2,000 Units by mouth daily.     diazepam (VALIUM) 2 MG tablet Take 1 tablet (2 mg total) by mouth 2 (two) times daily as needed for anxiety. 30 tablet 0   fluconazole (DIFLUCAN) 200 MG tablet Take 200 mg by mouth 2 (two) times a week.     ketoconazole (NIZORAL) 2 % shampoo Apply 1 application topically 3 (three) times a week.     loratadine (CLARITIN) 10 MG tablet Take 10 mg by mouth daily.     nystatin cream (MYCOSTATIN) Apply 1 application topically 2 (two) times daily.     prednisoLONE acetate (PRED MILD) 0.12 % ophthalmic suspension      Tapinarof (VTAMA) 1 % CREA Apply topically.     triamcinolone cream (KENALOG) 0.1 % Apply 1 application topically 2 (two) times daily.     No current facility-administered medications on file prior to visit.   Past Medical History:  Diagnosis Date   Cancer [facility])    Closed nondisplaced intertrochanteric fracture of left femur ([place])    Hypertension    Intertrochanteric fracture of left hip ([place]) 01/29/2020   Osteopenia    Squamous cell carcinoma of  leg, left    Past Surgical History:  Procedure Laterality Date   BREAST EXCISIONAL BIOPSY Right    years ago   CHOLECYSTECTOMY     FRACTURE SURGERY     L hip 30 years ago   INTRAMEDULLARY (IM) NAIL INTERTROCHANTERIC Left 01/23/2020   Procedure: INTRAMEDULLARY (IM) NAIL INTERTROCHANTRIC;  Surgeon: Leandrew Koyanagi, MD;  Location: [place];  Service: Orthopedics;  Laterality: Left;   right cataract  05/2021    Family History  Problem Relation Age of Onset   Transient ischemic attack Mother    Dementia Mother    Cancer Father    Hypertension Father    Osteoarthritis Sister    Depression Sister    Melanoma Brother    Basal cell carcinoma Brother    Squamous cell carcinoma Brother    Breast cancer Maternal Aunt    Breast cancer Maternal Aunt    Breast cancer Paternal Aunt    Pancreatic cancer Paternal Uncle    Esophageal cancer Other    Social History   Socioeconomic History   Marital status: Married    Spouse name: Not on file   Number of children: Not on file   Years of education: Not on file   Highest education level: Not on file  Occupational History   Not on file  Tobacco Use   Smoking  status: Former   Smokeless tobacco: Never  Substance and Sexual Activity   Alcohol use: Never   Drug use: Never   Sexual activity: Not on file  Other Topics Concern   Not on file  Social History Narrative   Not on file   Social Determinants of Health   Financial Resource Strain: Not on file  Food Insecurity: Not on file  Transportation Needs: Not on file  Physical Activity: Sufficiently Active   Days of Exercise per Week: 4 days   Minutes of Exercise per Session: 60 min  Stress: Not on file  Social Connections: Not on file    Review of Systems  Constitutional:  Negative for chills, fatigue and fever.  HENT:  Negative for congestion, rhinorrhea and sore throat.   Respiratory:  Positive for cough (Has been changing diet as she believes it is gerd.). Negative for shortness of  breath.   Cardiovascular:  Negative for chest pain.  Gastrointestinal:  Negative for abdominal pain, constipation, diarrhea, nausea and vomiting.  Genitourinary:  Negative for dysuria and urgency.  Musculoskeletal:  Negative for back pain and myalgias.  Skin:  Positive for rash.  Neurological:  Negative for dizziness, weakness, light-headedness and headaches.  Psychiatric/Behavioral:  Negative for dysphoric mood. The patient is not nervous/anxious.     Objective:  BP 110/64    Pulse (!) 52    Temp (!) 97.1 F (36.2 C)    Resp 16    Ht 5\' 5"  (1.651 m)    Wt 180 lb 9.6 oz (81.9 kg)    BMI 30.05 kg/m   BP/Weight 05/20/2021 03/02/2021 8/92/1194  Systolic BP 174 081 448  Diastolic BP 64 78 68  Wt. (Lbs) 180.6 181 185.6  BMI 30.05 30.12 30.89    Physical Exam Vitals reviewed.  Constitutional:      Appearance: Normal appearance. She is normal weight.  Neck:     Vascular: No carotid bruit.  Cardiovascular:     Rate and Rhythm: Normal rate and regular rhythm.     Heart sounds: Normal heart sounds.  Pulmonary:     Effort: Pulmonary effort is normal. No respiratory distress.     Breath sounds: Normal breath sounds.  Abdominal:     General: Abdomen is flat. Bowel sounds are normal.     Palpations: Abdomen is soft.     Tenderness: There is no abdominal tenderness.  Neurological:     Mental Status: She is alert and oriented to person, place, and time.  Psychiatric:        Mood and Affect: Mood normal.        Behavior: Behavior normal.    Diabetic Foot Exam - Simple   No data filed      Lab Results  Component Value Date   WBC 7.5 05/21/2021   HGB 14.5 05/21/2021   HCT 41.8 05/21/2021   PLT 281 05/21/2021   GLUCOSE 94 05/21/2021   CHOL 199 05/21/2021   TRIG 148 05/21/2021   HDL 51 05/21/2021   LDLCALC 122 (H) 05/21/2021   ALT 33 (H) 05/21/2021   AST 31 05/21/2021   NA 136 05/21/2021   K 4.3 05/21/2021   CL 98 05/21/2021   CREATININE 0.91 05/21/2021   BUN 18 05/21/2021    CO2 25 05/21/2021   TSH 1.660 10/09/2020   INR 1.0 01/23/2020   HGBA1C 5.5 10/09/2020      Assessment & Plan:   Problem List Items Addressed This Visit  Cardiovascular and Mediastinum   Essential hypertension - Primary    Well controlled.  No changes to medicines.  Continue to work on eating a healthy diet and exercise.  Labs drawn today.       Relevant Medications   losartan (COZAAR) 100 MG tablet   chlorthalidone (HYGROTON) 50 MG tablet   metoprolol succinate (TOPROL-XL) 100 MG 24 hr tablet   Other Relevant Orders   CBC with Differential/Platelet (Completed)   Comprehensive metabolic panel (Completed)   Lipid panel (Completed)     Digestive   GERD (gastroesophageal reflux disease)    Start on famotidine 40 mg once daily x 1 month, then may take as needed or drop to 20 mg daily.       Relevant Medications   famotidine (PEPCID) 40 MG tablet     Other   Mixed hyperlipidemia    Well controlled.  No changes to medicines.  Continue to work on eating a healthy diet and exercise.  Labs drawn today.       Relevant Medications   losartan (COZAAR) 100 MG tablet   chlorthalidone (HYGROTON) 50 MG tablet   metoprolol succinate (TOPROL-XL) 100 MG 24 hr tablet   Other Relevant Orders   Lipid panel (Completed)  .  Meds ordered this encounter  Medications   losartan (COZAAR) 100 MG tablet    Sig: Take 1 tablet (100 mg total) by mouth daily.    Dispense:  90 tablet    Refill:  2   chlorthalidone (HYGROTON) 50 MG tablet    Sig: Take 1 tablet (50 mg total) by mouth daily.    Dispense:  90 tablet    Refill:  2   metoprolol succinate (TOPROL-XL) 100 MG 24 hr tablet    Sig: TAKE 1 TABLET BY MOUTH ONCE DAILY. TAKE WITH OR IMMEDIATELY FOLLOWING A MEAL    Dispense:  90 tablet    Refill:  2   famotidine (PEPCID) 40 MG tablet    Sig: Take 1 tablet (40 mg total) by mouth daily.    Dispense:  90 tablet    Refill:  0    Orders Placed This Encounter  Procedures    CBC with Differential/Platelet   Comprehensive metabolic panel   Lipid panel     Follow-up: Return in about 1 day (around 05/21/2021) for Nurse visit, awv end of 10/2021 (wants to come ahed for fasting labs.).  I,Marla I Leal-Borjas,acting as a scribe for Rochel Brome, MD.,have documented all relevant documentation on the behalf of Rochel Brome, MD,as directed by  Rochel Brome, MD while in the presence of Rochel Brome, MD.   An After Visit Summary was printed and given to the patient.  Rochel Brome, MD [facility] [phone]

## 2021-05-20 NOTE — Patient Instructions (Signed)
Start on famotidine 40 mg once daily x 1 month, then may take as needed or drop to 20 mg daily.

## 2021-05-21 ENCOUNTER — Other Ambulatory Visit: Payer: Medicare HMO

## 2021-05-21 DIAGNOSIS — I10 Essential (primary) hypertension: Secondary | ICD-10-CM

## 2021-05-22 LAB — COMPREHENSIVE METABOLIC PANEL
ALT: 33 IU/L — ABNORMAL HIGH (ref 0–32)
AST: 31 IU/L (ref 0–40)
Albumin/Globulin Ratio: 2.2 (ref 1.2–2.2)
Albumin: 4.6 g/dL (ref 3.8–4.8)
Alkaline Phosphatase: 83 IU/L (ref 44–121)
BUN/Creatinine Ratio: 20 (ref 12–28)
BUN: 18 mg/dL (ref 8–27)
Bilirubin Total: 0.6 mg/dL (ref 0.0–1.2)
CO2: 25 mmol/L (ref 20–29)
Calcium: 10.1 mg/dL (ref 8.7–10.3)
Chloride: 98 mmol/L (ref 96–106)
Creatinine, Ser: 0.91 mg/dL (ref 0.57–1.00)
Globulin, Total: 2.1 g/dL (ref 1.5–4.5)
Glucose: 94 mg/dL (ref 70–99)
Potassium: 4.3 mmol/L (ref 3.5–5.2)
Sodium: 136 mmol/L (ref 134–144)
Total Protein: 6.7 g/dL (ref 6.0–8.5)
eGFR: 68 mL/min/{1.73_m2} (ref >59)

## 2021-05-22 LAB — LIPID PANEL
Chol/HDL Ratio: 3.9 ratio (ref 0.0–4.4)
Cholesterol, Total: 199 mg/dL (ref 100–199)
HDL: 51 mg/dL (ref >39)
LDL Chol Calc (NIH): 122 mg/dL — ABNORMAL HIGH (ref 0–99)
Triglycerides: 148 mg/dL (ref 0–149)
VLDL Cholesterol Cal: 26 mg/dL (ref 5–40)

## 2021-05-22 LAB — CBC WITH DIFFERENTIAL/PLATELET
Basophils Absolute: 0.1 10*3/uL (ref 0.0–0.2)
Basos: 1 %
EOS (ABSOLUTE): 0.6 10*3/uL — ABNORMAL HIGH (ref 0.0–0.4)
Eos: 9 %
Hematocrit: 41.8 % (ref 34.0–46.6)
Hemoglobin: 14.5 g/dL (ref 11.1–15.9)
Immature Grans (Abs): 0 10*3/uL (ref 0.0–0.1)
Immature Granulocytes: 0 %
Lymphocytes Absolute: 1.6 10*3/uL (ref 0.7–3.1)
Lymphs: 21 %
MCH: 31.6 pg (ref 26.6–33.0)
MCHC: 34.7 g/dL (ref 31.5–35.7)
MCV: 91 fL (ref 79–97)
Monocytes Absolute: 0.6 10*3/uL (ref 0.1–0.9)
Monocytes: 8 %
Neutrophils Absolute: 4.6 10*3/uL (ref 1.4–7.0)
Neutrophils: 61 %
Platelets: 281 10*3/uL (ref 150–450)
RBC: 4.59 x10E6/uL (ref 3.77–5.28)
RDW: 11.8 % (ref 11.7–15.4)
WBC: 7.5 10*3/uL (ref 3.4–10.8)

## 2021-05-22 LAB — CARDIOVASCULAR RISK ASSESSMENT

## 2021-05-22 NOTE — Assessment & Plan Note (Signed)
Well controlled.  ?No changes to medicines.  ?Continue to work on eating a healthy diet and exercise.  ?Labs drawn today.  ?

## 2021-05-22 NOTE — Assessment & Plan Note (Addendum)
Start on famotidine 40 mg once daily x 1 month, then may take as needed or drop to 20 mg daily.

## 2021-05-25 ENCOUNTER — Other Ambulatory Visit: Payer: Self-pay

## 2021-05-25 MED ORDER — PRAVASTATIN SODIUM 10 MG PO TABS: 10.0000 mg | ORAL_TABLET | Freq: Every day | ORAL | 0 refills | Status: DC

## 2021-05-27 DIAGNOSIS — H52202 Unspecified astigmatism, left eye: Secondary | ICD-10-CM | POA: Diagnosis not present

## 2021-05-27 DIAGNOSIS — H25012 Cortical age-related cataract, left eye: Secondary | ICD-10-CM | POA: Diagnosis not present

## 2021-05-27 DIAGNOSIS — H2512 Age-related nuclear cataract, left eye: Secondary | ICD-10-CM | POA: Diagnosis not present

## 2021-05-27 DIAGNOSIS — H25042 Posterior subcapsular polar age-related cataract, left eye: Secondary | ICD-10-CM | POA: Diagnosis not present

## 2021-06-24 DIAGNOSIS — Z0100 Encounter for examination of eyes and vision without abnormal findings: Secondary | ICD-10-CM | POA: Diagnosis not present

## 2021-08-24 ENCOUNTER — Other Ambulatory Visit: Payer: Self-pay | Admitting: Family Medicine

## 2021-09-02 ENCOUNTER — Other Ambulatory Visit: Payer: Self-pay

## 2021-09-02 ENCOUNTER — Ambulatory Visit: Payer: Medicare HMO

## 2021-09-02 DIAGNOSIS — I10 Essential (primary) hypertension: Secondary | ICD-10-CM

## 2021-09-02 DIAGNOSIS — E782 Mixed hyperlipidemia: Secondary | ICD-10-CM

## 2021-09-03 LAB — LIPID PANEL
Chol/HDL Ratio: 3.4 ratio (ref 0.0–4.4)
Cholesterol, Total: 181 mg/dL (ref 100–199)
HDL: 53 mg/dL (ref >39)
LDL Chol Calc (NIH): 105 mg/dL — ABNORMAL HIGH (ref 0–99)
Triglycerides: 133 mg/dL (ref 0–149)
VLDL Cholesterol Cal: 23 mg/dL (ref 5–40)

## 2021-09-03 LAB — COMPREHENSIVE METABOLIC PANEL
ALT: 20 IU/L (ref 0–32)
AST: 19 IU/L (ref 0–40)
Albumin/Globulin Ratio: 1.9 (ref 1.2–2.2)
Albumin: 4.5 g/dL (ref 3.8–4.8)
Alkaline Phosphatase: 76 IU/L (ref 44–121)
BUN/Creatinine Ratio: 20 (ref 12–28)
BUN: 15 mg/dL (ref 8–27)
Bilirubin Total: 0.5 mg/dL (ref 0.0–1.2)
CO2: 27 mmol/L (ref 20–29)
Calcium: 9.6 mg/dL (ref 8.7–10.3)
Chloride: 96 mmol/L (ref 96–106)
Creatinine, Ser: 0.76 mg/dL (ref 0.57–1.00)
Globulin, Total: 2.4 g/dL (ref 1.5–4.5)
Glucose: 93 mg/dL (ref 70–99)
Potassium: 4.4 mmol/L (ref 3.5–5.2)
Sodium: 135 mmol/L (ref 134–144)
Total Protein: 6.9 g/dL (ref 6.0–8.5)
eGFR: 84 mL/min/{1.73_m2} (ref >59)

## 2021-09-03 LAB — CBC
Hematocrit: 41.5 % (ref 34.0–46.6)
Hemoglobin: 14.6 g/dL (ref 11.1–15.9)
MCH: 32.6 pg (ref 26.6–33.0)
MCHC: 35.2 g/dL (ref 31.5–35.7)
MCV: 93 fL (ref 79–97)
Platelets: 269 10*3/uL (ref 150–450)
RBC: 4.48 x10E6/uL (ref 3.77–5.28)
RDW: 11.9 % (ref 11.7–15.4)
WBC: 7.8 10*3/uL (ref 3.4–10.8)

## 2021-09-03 LAB — CARDIOVASCULAR RISK ASSESSMENT

## 2021-09-14 DIAGNOSIS — L814 Other melanin hyperpigmentation: Secondary | ICD-10-CM | POA: Diagnosis not present

## 2021-09-14 DIAGNOSIS — D485 Neoplasm of uncertain behavior of skin: Secondary | ICD-10-CM | POA: Diagnosis not present

## 2021-09-14 DIAGNOSIS — L821 Other seborrheic keratosis: Secondary | ICD-10-CM | POA: Diagnosis not present

## 2021-09-14 DIAGNOSIS — D2239 Melanocytic nevi of other parts of face: Secondary | ICD-10-CM | POA: Diagnosis not present

## 2021-09-14 DIAGNOSIS — D225 Melanocytic nevi of trunk: Secondary | ICD-10-CM | POA: Diagnosis not present

## 2021-09-30 DIAGNOSIS — C44612 Basal cell carcinoma of skin of right upper limb, including shoulder: Secondary | ICD-10-CM | POA: Diagnosis not present

## 2021-09-30 DIAGNOSIS — C4491 Basal cell carcinoma of skin, unspecified: Secondary | ICD-10-CM

## 2021-09-30 HISTORY — DX: Basal cell carcinoma of skin, unspecified: C44.91

## 2021-11-23 ENCOUNTER — Other Ambulatory Visit: Payer: Medicare HMO

## 2021-11-25 ENCOUNTER — Encounter: Payer: Medicare HMO | Admitting: Family Medicine

## 2021-11-28 ENCOUNTER — Other Ambulatory Visit: Payer: Self-pay | Admitting: Family Medicine

## 2022-01-10 DIAGNOSIS — L209 Atopic dermatitis, unspecified: Secondary | ICD-10-CM | POA: Diagnosis not present

## 2022-01-10 DIAGNOSIS — C44612 Basal cell carcinoma of skin of right upper limb, including shoulder: Secondary | ICD-10-CM | POA: Diagnosis not present

## 2022-01-27 ENCOUNTER — Other Ambulatory Visit: Payer: Medicare HMO

## 2022-01-27 DIAGNOSIS — E782 Mixed hyperlipidemia: Secondary | ICD-10-CM | POA: Diagnosis not present

## 2022-01-27 DIAGNOSIS — I10 Essential (primary) hypertension: Secondary | ICD-10-CM

## 2022-01-28 LAB — COMPREHENSIVE METABOLIC PANEL
ALT: 17 IU/L (ref 0–32)
AST: 18 IU/L (ref 0–40)
Albumin/Globulin Ratio: 2 (ref 1.2–2.2)
Albumin: 4.4 g/dL (ref 3.9–4.9)
Alkaline Phosphatase: 72 IU/L (ref 44–121)
BUN/Creatinine Ratio: 17 (ref 12–28)
BUN: 15 mg/dL (ref 8–27)
Bilirubin Total: 0.5 mg/dL (ref 0.0–1.2)
CO2: 26 mmol/L (ref 20–29)
Calcium: 9.6 mg/dL (ref 8.7–10.3)
Chloride: 93 mmol/L — ABNORMAL LOW (ref 96–106)
Creatinine, Ser: 0.88 mg/dL (ref 0.57–1.00)
Globulin, Total: 2.2 g/dL (ref 1.5–4.5)
Glucose: 94 mg/dL (ref 70–99)
Potassium: 3.7 mmol/L (ref 3.5–5.2)
Sodium: 137 mmol/L (ref 134–144)
Total Protein: 6.6 g/dL (ref 6.0–8.5)
eGFR: 71 mL/min/{1.73_m2} (ref >59)

## 2022-01-28 LAB — LIPID PANEL
Chol/HDL Ratio: 3.3 ratio (ref 0.0–4.4)
Cholesterol, Total: 179 mg/dL (ref 100–199)
HDL: 54 mg/dL (ref >39)
LDL Chol Calc (NIH): 101 mg/dL — ABNORMAL HIGH (ref 0–99)
Triglycerides: 139 mg/dL (ref 0–149)
VLDL Cholesterol Cal: 24 mg/dL (ref 5–40)

## 2022-01-28 LAB — CBC WITH DIFF/PLATELET
Basophils Absolute: 0.1 10*3/uL (ref 0.0–0.2)
Basos: 1 %
EOS (ABSOLUTE): 0.7 10*3/uL — ABNORMAL HIGH (ref 0.0–0.4)
Eos: 9 %
Hematocrit: 42.1 % (ref 34.0–46.6)
Hemoglobin: 14.6 g/dL (ref 11.1–15.9)
Immature Grans (Abs): 0 10*3/uL (ref 0.0–0.1)
Immature Granulocytes: 0 %
Lymphocytes Absolute: 1.8 10*3/uL (ref 0.7–3.1)
Lymphs: 23 %
MCH: 31.7 pg (ref 26.6–33.0)
MCHC: 34.7 g/dL (ref 31.5–35.7)
MCV: 91 fL (ref 79–97)
Monocytes Absolute: 0.6 10*3/uL (ref 0.1–0.9)
Monocytes: 8 %
Neutrophils Absolute: 4.4 10*3/uL (ref 1.4–7.0)
Neutrophils: 59 %
Platelets: 255 10*3/uL (ref 150–450)
RBC: 4.61 x10E6/uL (ref 3.77–5.28)
RDW: 11.9 % (ref 11.7–15.4)
WBC: 7.5 10*3/uL (ref 3.4–10.8)

## 2022-01-28 LAB — CARDIOVASCULAR RISK ASSESSMENT

## 2022-01-30 ENCOUNTER — Encounter: Payer: Self-pay | Admitting: Family Medicine

## 2022-01-31 ENCOUNTER — Ambulatory Visit (INDEPENDENT_AMBULATORY_CARE_PROVIDER_SITE_OTHER): Payer: Medicare HMO | Admitting: Family Medicine

## 2022-01-31 ENCOUNTER — Encounter: Payer: Self-pay | Admitting: Family Medicine

## 2022-01-31 VITALS — BP 142/72 | HR 58 | Temp 97.5°F | Ht 65.0 in | Wt 184.0 lb

## 2022-01-31 DIAGNOSIS — Z1231 Encounter for screening mammogram for malignant neoplasm of breast: Secondary | ICD-10-CM | POA: Diagnosis not present

## 2022-01-31 DIAGNOSIS — Z0000 Encounter for general adult medical examination without abnormal findings: Secondary | ICD-10-CM | POA: Diagnosis not present

## 2022-01-31 DIAGNOSIS — Z13820 Encounter for screening for osteoporosis: Secondary | ICD-10-CM

## 2022-01-31 DIAGNOSIS — K219 Gastro-esophageal reflux disease without esophagitis: Secondary | ICD-10-CM | POA: Diagnosis not present

## 2022-01-31 DIAGNOSIS — I10 Essential (primary) hypertension: Secondary | ICD-10-CM

## 2022-01-31 MED ORDER — FAMOTIDINE 20 MG PO TABS
20.0000 mg | ORAL_TABLET | Freq: Every day | ORAL | 0 refills | Status: AC
Start: 2022-01-31 — End: ?

## 2022-01-31 NOTE — Progress Notes (Unsigned)
Subjective:   Kimberly Whitney is a 71 y.o. female who presents for Medicare Annual (Subsequent) preventive examination.  Review of Systems    Review of Systems  Constitutional:  Negative for fever and malaise/fatigue.  HENT:  Negative for ear pain, sinus pain and sore throat.   Respiratory:  Negative for cough, shortness of breath and wheezing.   Cardiovascular:  Negative for chest pain and leg swelling.  Gastrointestinal:  Negative for abdominal pain, constipation, diarrhea, heartburn, nausea and vomiting.  Genitourinary:  Negative for frequency.  Musculoskeletal:  Negative for falls, joint pain and myalgias.  Skin:  Negative for rash.  Neurological:  Negative for dizziness, weakness and headaches.  Psychiatric/Behavioral:  Negative for depression. The patient is not nervous/anxious.           Objective:    Today's Vitals   01/31/22 1417  BP: (!) 142/72  Pulse: (!) 58  Temp: (!) 97.5 F (36.4 C)  TempSrc: Temporal  SpO2: 99%  Weight: 184 lb (83.5 kg)  Height: '5\' 5"'$  (1.651 m)   Body mass index is 30.62 kg/m.     01/31/2022    2:18 PM 11/15/2020    2:25 PM 01/29/2020    6:11 PM 01/24/2020    8:06 AM 01/23/2020    3:16 PM  Advanced Directives  Does Patient Have a Medical Advance Directive? No No No  No  Would patient like information on creating a medical advance directive? Yes (MAU/Ambulatory/Procedural Areas - Information given)  No - Patient declined No - Patient declined     Current Medications (verified) Outpatient Encounter Medications as of 01/31/2022  Medication Sig   calcium-vitamin D (OSCAL WITH D) 500-200 MG-UNIT tablet Take 1 tablet by mouth 2 (two) times daily.   chlorthalidone (HYGROTON) 50 MG tablet Take 1 tablet (50 mg total) by mouth daily.   cholecalciferol (VITAMIN D3) 25 MCG (1000 UNIT) tablet Take 2,000 Units by mouth daily.   diazepam (VALIUM) 2 MG tablet Take 1 tablet (2 mg total) by mouth 2 (two) times daily as needed for anxiety.   famotidine  (PEPCID) 40 MG tablet Take 1 tablet (40 mg total) by mouth daily.   fluconazole (DIFLUCAN) 200 MG tablet Take 200 mg by mouth 2 (two) times a week.   ketoconazole (NIZORAL) 2 % shampoo Apply 1 application topically 3 (three) times a week.   loratadine (CLARITIN) 10 MG tablet Take 10 mg by mouth daily.   losartan (COZAAR) 100 MG tablet Take 1 tablet (100 mg total) by mouth daily.   metoprolol succinate (TOPROL-XL) 100 MG 24 hr tablet TAKE 1 TABLET BY MOUTH ONCE DAILY. TAKE WITH OR IMMEDIATELY FOLLOWING A MEAL   nystatin cream (MYCOSTATIN) Apply 1 application topically 2 (two) times daily.   pravastatin (PRAVACHOL) 10 MG tablet Take 1 tablet by mouth once daily   triamcinolone cream (KENALOG) 0.1 % Apply 1 application topically 2 (two) times daily.   [DISCONTINUED] Besifloxacin HCl (BESIVANCE) 0.6 % SUSP Apply to eye.   [DISCONTINUED] Bromfenac Sodium 0.07 % SOLN Apply to eye daily.   [DISCONTINUED] prednisoLONE acetate (PRED MILD) 0.12 % ophthalmic suspension    [DISCONTINUED] Tapinarof (VTAMA) 1 % CREA Apply topically.   No facility-administered encounter medications on file as of 01/31/2022.    Allergies (verified) Amlodipine, Lisinopril, and Lidocaine   History: Past Medical History:  Diagnosis Date   Cancer ([place])    Closed nondisplaced intertrochanteric fracture of left femur ([facility])    Hypertension    Intertrochanteric fracture of left  hip ([place]) 01/29/2020   Osteopenia    Squamous cell carcinoma of leg, left    Past Surgical History:  Procedure Laterality Date   BREAST EXCISIONAL BIOPSY Right    years ago   CHOLECYSTECTOMY     FRACTURE SURGERY     L hip 30 years ago   INTRAMEDULLARY (IM) NAIL INTERTROCHANTERIC Left 01/23/2020   Procedure: INTRAMEDULLARY (IM) NAIL INTERTROCHANTRIC;  Surgeon: Leandrew Koyanagi, MD;  Location: [place];  Service: Orthopedics;  Laterality: Left;   right cataract  05/2021   Family History  Problem Relation Age of Onset   Transient ischemic attack  Mother    Dementia Mother    Cancer Father    Hypertension Father    Osteoarthritis Sister    Depression Sister    Melanoma Brother    Basal cell carcinoma Brother    Squamous cell carcinoma Brother    Breast cancer Maternal Aunt    Breast cancer Maternal Aunt    Breast cancer Paternal Aunt    Pancreatic cancer Paternal Uncle    Esophageal cancer Other    Social History   Socioeconomic History   Marital status: Married    Spouse name: Not on file   Number of children: Not on file   Years of education: Not on file   Highest education level: Not on file  Occupational History   Not on file  Tobacco Use   Smoking status: Former   Smokeless tobacco: Never  Substance and Sexual Activity   Alcohol use: Never   Drug use: Never   Sexual activity: Not on file  Other Topics Concern   Not on file  Social History Narrative   Not on file   Social Determinants of Health   Financial Resource Strain: Low Risk  (01/31/2022)   Overall Financial Resource Strain (CARDIA)    Difficulty of Paying Living Expenses: Not hard at all  Food Insecurity: No Food Insecurity (01/31/2022)   Hunger Vital Sign    Worried About Running Out of Food in the Last Year: Never true    [place] in the Last Year: Never true  Transportation Needs: No Transportation Needs (01/31/2022)   PRAPARE - Hydrologist (Medical): No    Lack of Transportation (Non-Medical): No  Physical Activity: Sufficiently Active (01/31/2022)   Exercise Vital Sign    Days of Exercise per Week: 3 days    Minutes of Exercise per Session: 60 min  Stress: No Stress Concern Present (01/31/2022)   [place]    Feeling of Stress : Not at all  Social Connections: Moderately Integrated (01/31/2022)   Social Connection and Isolation Panel [NHANES]    Frequency of Communication with Friends and Family: More than three times a week    Frequency  of Social Gatherings with Friends and Family: More than three times a week    Attends Religious Services: Never    Marine scientist or Organizations: Yes    Attends Music therapist: More than 4 times per year    Marital Status: Married    Tobacco Counseling Counseling given: Not Answered   How often do you need to have someone help you when you read instructions, pamphlets, or other written materials from your doctor or pharmacy?: (P) 1 - Never   Activities of Daily Living    01/30/2022    4:12 PM  In your present state of health,  do you have any difficulty performing the following activities:  Hearing? 0  Vision? 0  Difficulty concentrating or making decisions? 0  Walking or climbing stairs? 0  Dressing or bathing? 0  Doing errands, shopping? 0  Preparing Food and eating ? N  Using the Toilet? N  In the past six months, have you accidently leaked urine? N  Do you have problems with loss of bowel control? N  Managing your Medications? N  Managing your Finances? N  Housekeeping or managing your Housekeeping? N    Patient Care Team: Rochel Brome, MD as PCP - General (Family Medicine) Leandrew Koyanagi, MD as Attending Physician (Orthopedic Surgery)  Indicate any recent Medical Services you may have received from other than [facility] providers in the past year (date may be approximate).     Assessment:   This is a routine wellness examination for [place].  Hearing/Vision screen No results found.  Dietary issues and exercise activities discussed:      Depression Screen    01/31/2022    1:59 PM 11/15/2020    2:23 PM 10/09/2020   10:25 AM  PHQ 2/9 Scores  PHQ - 2 Score 0 0 0  PHQ- 9 Score 0      Fall Risk    01/31/2022    2:00 PM 01/30/2022    4:12 PM 11/15/2020    2:25 PM 10/09/2020   10:24 AM  [place] in the past year? 0 0  1  Number falls in past yr: 0   1  Injury with Fall? 0   1  Risk for fall due to : No Fall Risks  No Fall Risks    Follow up Falls evaluation completed  Falls evaluation completed     [place]:  Any stairs in or around the home? No  If so, are there any without handrails? Yes  Home free of loose throw rugs in walkways, pet beds, electrical cords, etc? Yes  Adequate lighting in your home to reduce risk of falls? Yes   ASSISTIVE DEVICES UTILIZED TO PREVENT FALLS:  Life alert? No  Use of a cane, walker or w/c? No  Grab bars in the bathroom? Yes  Shower chair or bench in shower? No  Elevated toilet seat or a handicapped toilet? Yes   TIMED UP AND GO:  Was the test performed? Yes .  Length of time to ambulate 10 feet:  sec.   Gait steady and fast without use of assistive device  Cognitive Function:        Immunizations Immunization History  Administered Date(s) Administered   Influenza Split 01/28/2017   Influenza,inj,quad, With Preservative 02/12/2013   Pneumococcal Conjugate-13 07/19/2016   Pneumococcal Polysaccharide-23 08/01/2017   Tdap 11/29/2006, 10/30/2018   Zoster, Live 05/23/2013    TDAP status: Up to date  Flu Vaccine status: Declined, Education has been provided regarding the importance of this vaccine but patient still declined. Advised may receive this vaccine at local pharmacy or Health Dept. Aware to provide a copy of the vaccination record if obtained from local pharmacy or Health Dept. Verbalized acceptance and understanding.  Pneumococcal vaccine status: Up to date  Covid-19 vaccine status: Information provided on how to obtain vaccines.   Qualifies for Shingles Vaccine? Yes   Zostavax completed Yes   Shingrix Completed?: No.    Education has been provided regarding the importance of this vaccine. Patient has been advised to call insurance company to determine  out of pocket expense if they have not yet received this vaccine. Advised may also receive vaccine at local pharmacy or Health Dept. Verbalized acceptance and  understanding.  Screening Tests Health Maintenance  Topic Date Due   COVID-19 Vaccine (1) Never done   Hepatitis C Screening  Never done   Zoster Vaccines- Shingrix (1 of 2) Never done   INFLUENZA VACCINE  11/30/2021   MAMMOGRAM  02/22/2022   COLONOSCOPY (Pts 45-86yr Insurance coverage will need to be confirmed)  09/08/2026   TETANUS/TDAP  10/29/2028   Pneumonia Vaccine 70 Years old  Completed   DEXA SCAN  Completed   HPV VACCINES  Aged Out    Health Maintenance  Health Maintenance Due  Topic Date Due   COVID-19 Vaccine (1) Never done   Hepatitis C Screening  Never done   Zoster Vaccines- Shingrix (1 of 2) Never done   INFLUENZA VACCINE  11/30/2021    Colorectal cancer screening: Type of screening: Colonoscopy. Completed 09/07/2016. Repeat every 10 years Lung Cancer Screening: (Low Dose CT Chest recommended if Age 70-80years, 30 pack-year currently smoking OR have quit w/in 15years.) does not qualify.   Lung Cancer Screening Referral: not applicable.  Additional Screening:  Hepatitis C Screening: does not qualify; Completed previously.  Vision Screening: Recommended annual ophthalmology exams for early detection of glaucoma and other disorders of the eye. Is the patient up to date with their annual eye exam?  Yes   Dental Screening: Recommended annual dental exams for proper oral hygiene  Community Resource Referral / Chronic Care Management: CRR required this visit?  No   CCM required this visit?  No      Plan:     Problem List Items Addressed This Visit       Cardiovascular and Mediastinum   Essential hypertension    Not at goal. Change losartan to olmesartan 40 mg daily.  Check bp and pulse daily one hour after taking bp medicines.         Digestive   GERD (gastroesophageal reflux disease)    Well-controlled on Pepcid      Relevant Medications   famotidine (PEPCID) 20 MG tablet     Other   Encounter for Medicare annual wellness exam -  Primary    Things to do to keep yourself healthy  - Exercise at least 30-45 minutes a day, 3-4 days a week.  - Eat a low-fat diet with lots of fruits and vegetables, up to 7-9 servings per day.  - Seatbelts can save your life. Wear them always.  - Smoke detectors on every level of your home, check batteries every year.  - Eye Doctor - have an eye exam every 1-2 years  - Alcohol -  If you drink, do it moderately, less than 2 drinks per day.  - [place] Choose someone to speak for you if you are not able. Please bring a copy of these once you have them done.  - Depression is common in our stressful world.If you're feeling down or losing interest in things you normally enjoy, please come in for a visit.       Encounter for screening mammogram for malignant neoplasm of breast   Relevant Orders   MM DIGITAL SCREENING BILATERAL   Osteoporosis screening   Relevant Orders   DG Bone Density     I have personally reviewed and noted the following in the patient's chart:   Medical and social history Use of alcohol,  tobacco or illicit drugs  Current medications and supplements including opioid prescriptions. Patient is not currently taking opioid prescriptions. Functional ability and status Nutritional status Physical activity Advanced directives List of other physicians Hospitalizations, surgeries, and ER visits in previous 12 months Vitals Screenings to include cognitive, depression, and falls Referrals and appointments  In addition, I have reviewed and discussed with patient certain preventive protocols, quality metrics, and best practice recommendations. A written personalized care plan for preventive services as well as general preventive health recommendations were provided to patient.        I,Lauren M Auman,acting as a scribe for Rochel Brome, MD.,have documented all relevant documentation on the behalf of Rochel Brome, MD,as directed by  Rochel Brome, MD  while in the presence of Rochel Brome, MD.

## 2022-01-31 NOTE — Patient Instructions (Signed)
Change losartan to olmesartan 40 mg daily.  Check bp and pulse daily one hour after taking bp medicines.   Things to do to keep yourself healthy  - Exercise at least 30-45 minutes a day, 3-4 days a week.  - Eat a low-fat diet with lots of fruits and vegetables, up to 7-9 servings per day.  - Seatbelts can save your life. Wear them always.  - Smoke detectors on every level of your home, check batteries every year.  - Eye Doctor - have an eye exam every 1-2 years  - Alcohol -  If you drink, do it moderately, less than 2 drinks per day.  - [place]. Choose someone to speak for you if you are not able. Please bring a copy of these once you have them done.  - Depression is common in our stressful world.If you're feeling down or losing interest in things you normally enjoy, please come in for a visit.

## 2022-02-02 ENCOUNTER — Encounter: Payer: Self-pay | Admitting: Family Medicine

## 2022-02-02 MED ORDER — OLMESARTAN MEDOXOMIL 40 MG PO TABS: 40.0000 mg | ORAL_TABLET | Freq: Every day | ORAL | 0 refills | Status: DC

## 2022-02-03 DIAGNOSIS — Z13820 Encounter for screening for osteoporosis: Secondary | ICD-10-CM | POA: Insufficient documentation

## 2022-02-03 DIAGNOSIS — Z0000 Encounter for general adult medical examination without abnormal findings: Secondary | ICD-10-CM | POA: Insufficient documentation

## 2022-02-03 DIAGNOSIS — Z1231 Encounter for screening mammogram for malignant neoplasm of breast: Secondary | ICD-10-CM | POA: Insufficient documentation

## 2022-02-03 NOTE — Assessment & Plan Note (Signed)
Things to do to keep yourself healthy  - Exercise at least 30-45 minutes a day, 3-4 days a week.  - Eat a low-fat diet with lots of fruits and vegetables, up to 7-9 servings per day.  - Seatbelts can save your life. Wear them always.  - Smoke detectors on every level of your home, check batteries every year.  - Eye Doctor - have an eye exam every 1-2 years  - Alcohol -  If you drink, do it moderately, less than 2 drinks per day.  - [place]. Choose someone to speak for you if you are not able. Please bring a copy of these once you have them done.  - Depression is common in our stressful world.If you're feeling down or losing interest in things you normally enjoy, please come in for a visit.

## 2022-02-03 NOTE — Assessment & Plan Note (Signed)
Well controlled on Pepcid

## 2022-02-03 NOTE — Assessment & Plan Note (Signed)
Not at goal. Change losartan to olmesartan 40 mg daily.  Check bp and pulse daily one hour after taking bp medicines.

## 2022-02-04 IMAGING — MG MM DIGITAL SCREENING BILAT W/ TOMO AND CAD
8 series · 9 of 24 positions shown · non-contrast
Comparison: Previous exam(s).

CLINICAL DATA: Screening.

EXAM:
DIGITAL SCREENING BILATERAL MAMMOGRAM WITH TOMOSYNTHESIS AND CAD
TECHNIQUE: Bilateral screening digital craniocaudal and mediolateral oblique
mammograms were obtained. Bilateral screening digital breast
tomosynthesis was performed. The images were evaluated with
computer-aided detection.

[L MLO synth-2D]
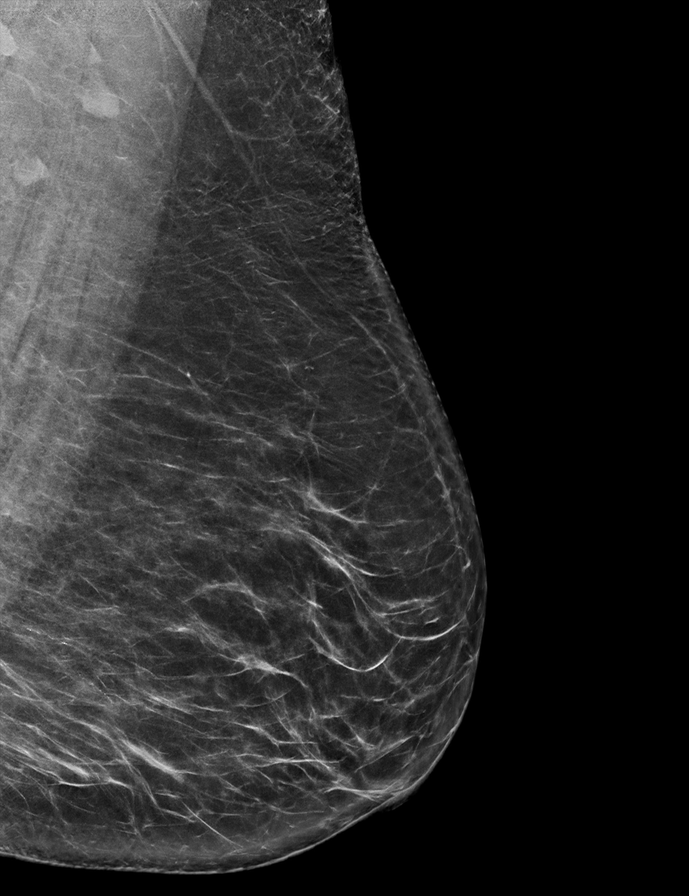

[L CC synth-2D]
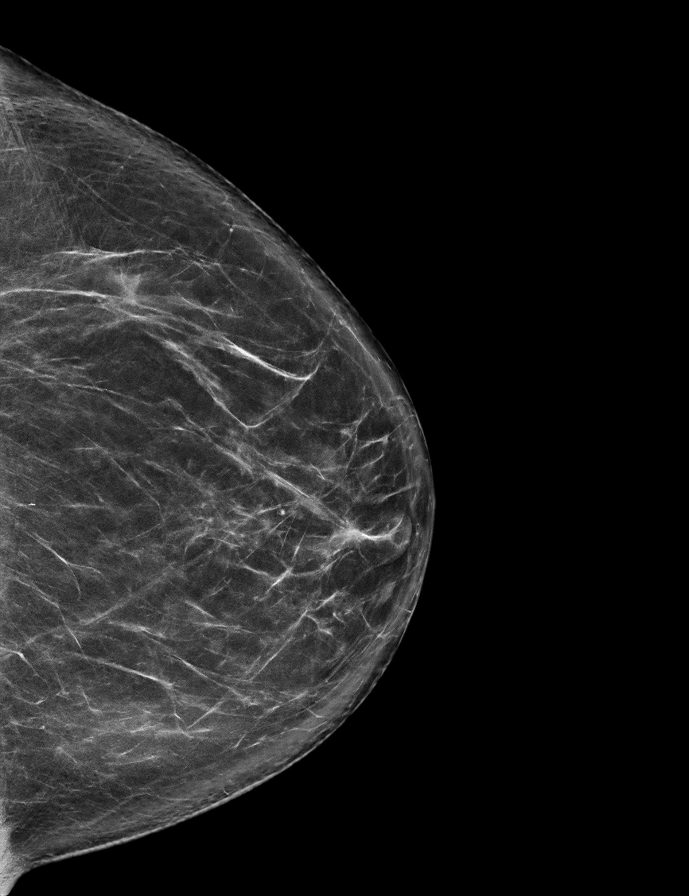

[R CC synth-2D]
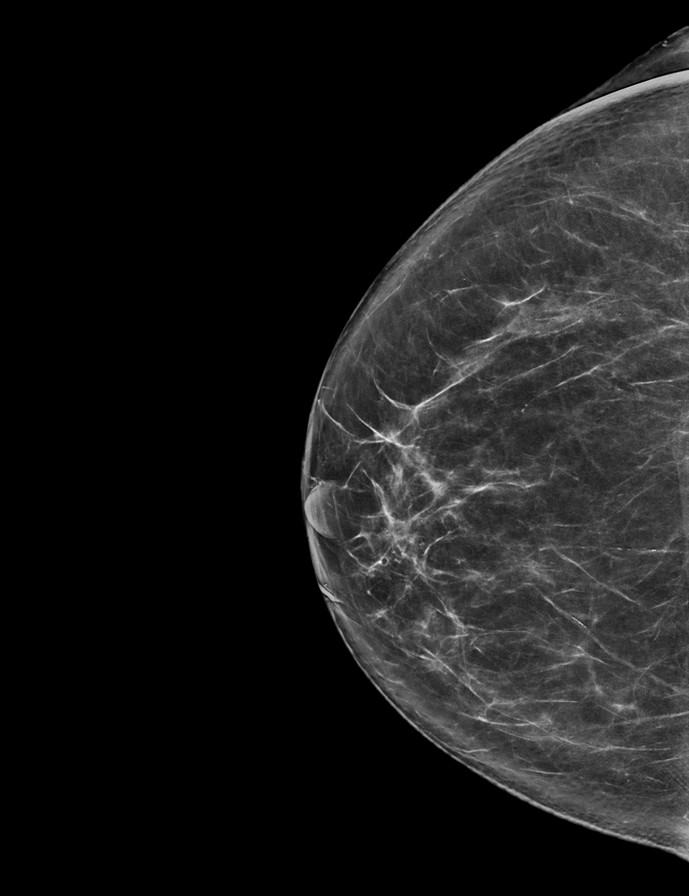

[R MLO synth-2D]
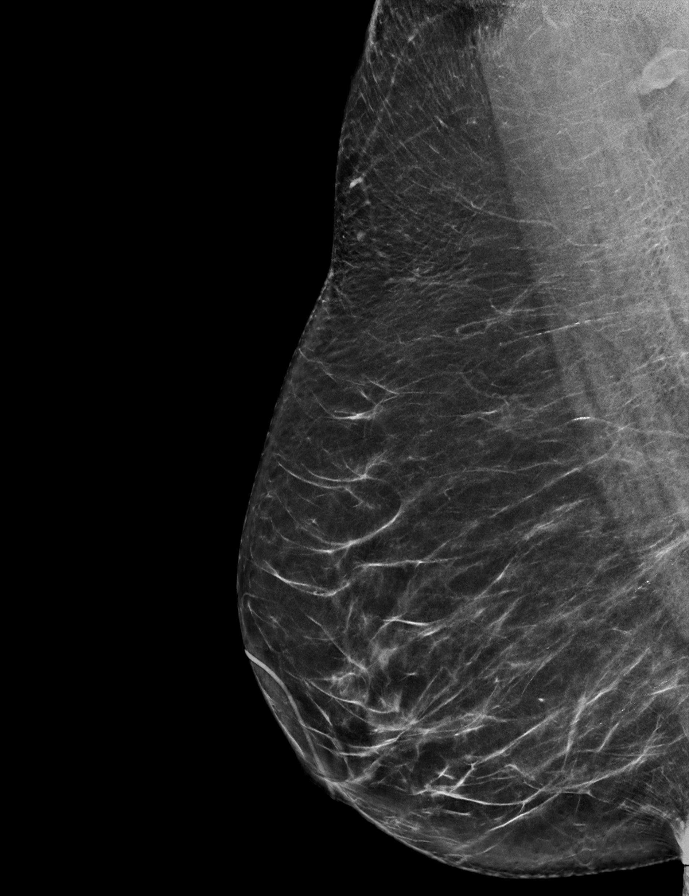

[R MLO tomo · 2 of 73 frames shown]
[frame 24/73]
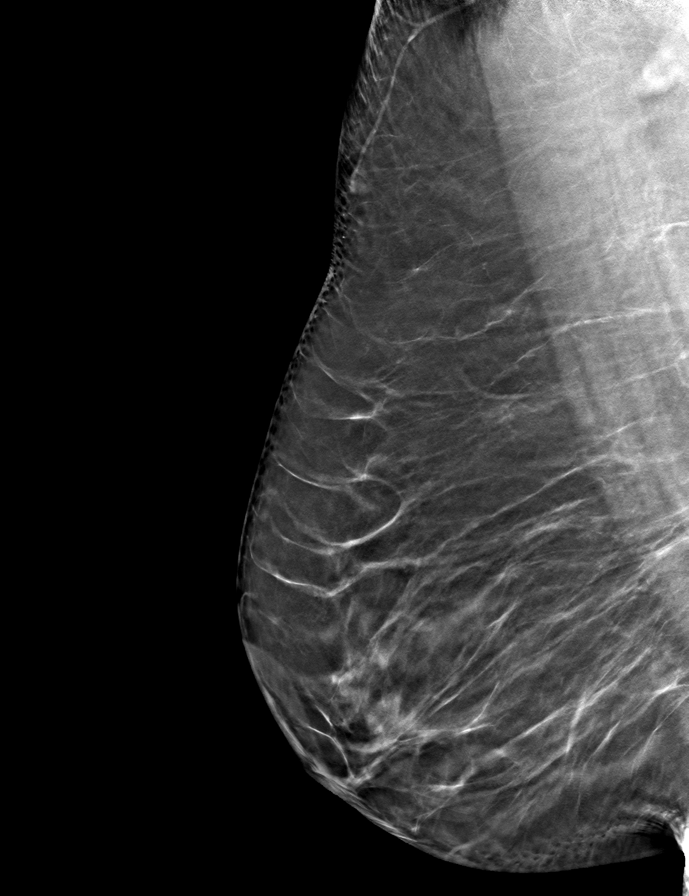
[frame 37/73]
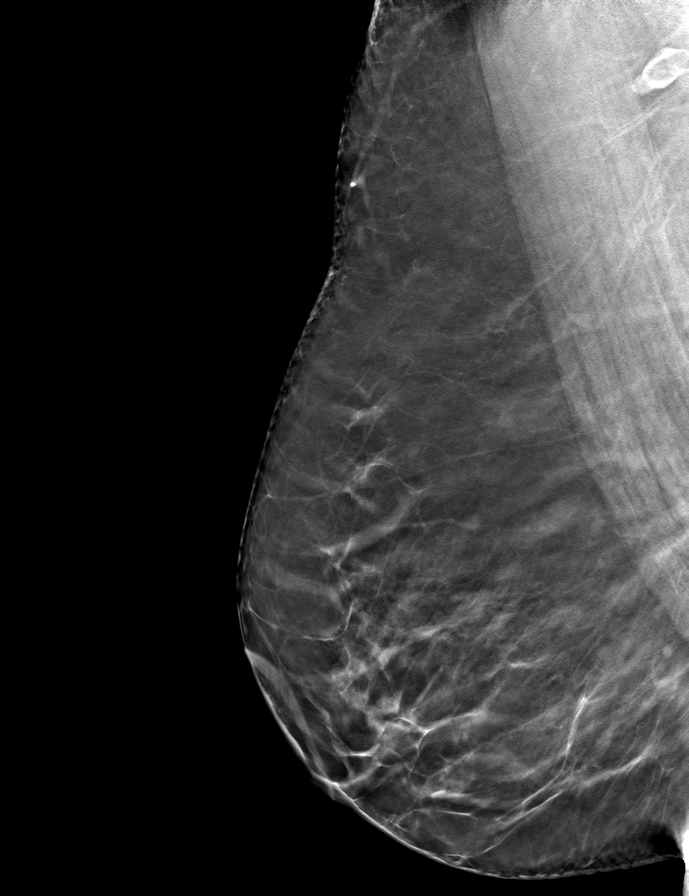

[L CC tomo · tomo slice 39/77.0]
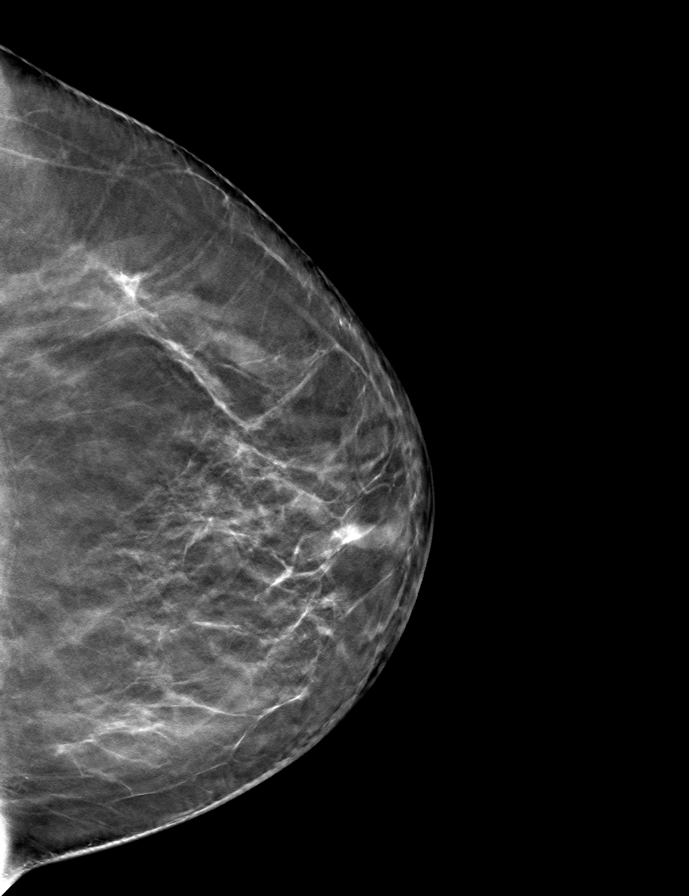

[R CC tomo · tomo slice 36/71.0]
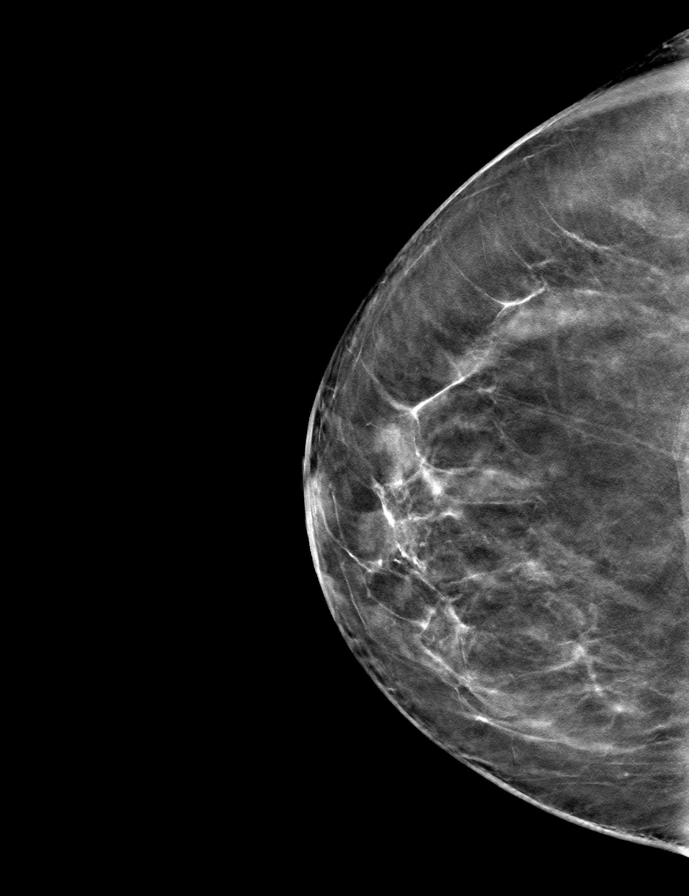

[L MLO tomo · tomo slice 39/76.0]
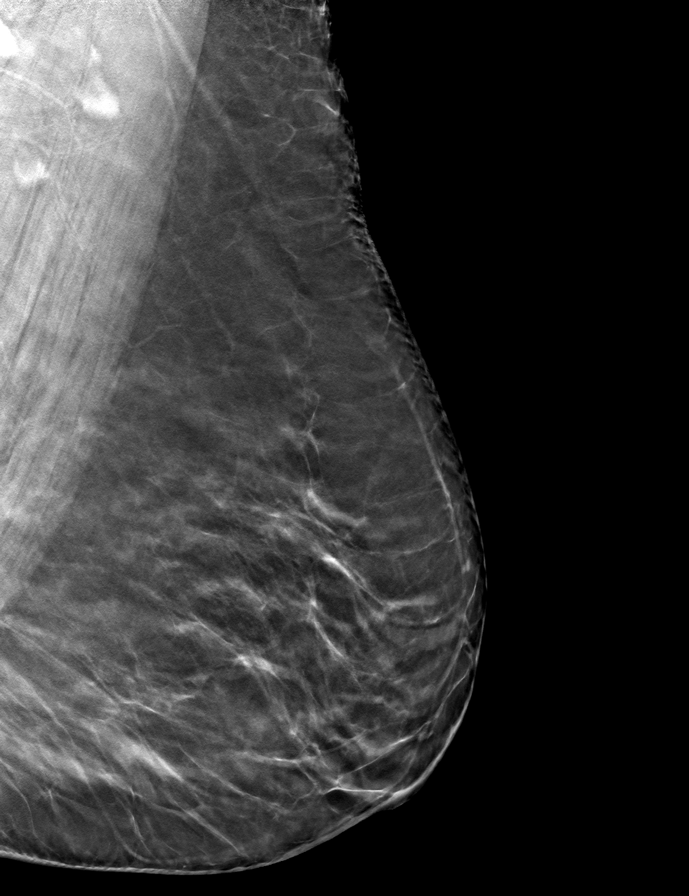

[9 of 24 positions shown; findings below may reference images not displayed]

ACR Breast Density Category b: There are scattered areas of
fibroglandular density.
FINDINGS: There are no findings suspicious for malignancy.
IMPRESSION: No mammographic evidence of malignancy. A result letter of this
screening mammogram will be mailed directly to the patient.

RECOMMENDATION:
Screening mammogram in one year. (Code:51-O-LD2)

BI-RADS CATEGORY  1: Negative.

## 2022-02-07 ENCOUNTER — Telehealth: Payer: Self-pay | Admitting: Family Medicine

## 2022-02-07 NOTE — Telephone Encounter (Signed)
   Kimberly Whitney has been scheduled for the following appointment:  WHAT: BONE DENSITY WHERE: [facility]  DATE: 02/24/22 TIME: 2:00 PM CHECK-IN  Patient has been made aware.

## 2022-02-20 ENCOUNTER — Other Ambulatory Visit: Payer: Self-pay | Admitting: Family Medicine

## 2022-02-23 ENCOUNTER — Ambulatory Visit
Admission: RE | Admit: 2022-02-23 | Discharge: 2022-02-23 | Disposition: A | Discharge status: Home / Self Care | Payer: Medicare HMO | Source: Ambulatory Visit | Attending: Family Medicine | Admitting: Family Medicine

## 2022-02-23 DIAGNOSIS — Z1231 Encounter for screening mammogram for malignant neoplasm of breast: Secondary | ICD-10-CM

## 2022-02-24 DIAGNOSIS — N959 Unspecified menopausal and perimenopausal disorder: Secondary | ICD-10-CM | POA: Diagnosis not present

## 2022-02-24 DIAGNOSIS — M8580 Other specified disorders of bone density and structure, unspecified site: Secondary | ICD-10-CM | POA: Diagnosis not present

## 2022-02-24 DIAGNOSIS — M85851 Other specified disorders of bone density and structure, right thigh: Secondary | ICD-10-CM | POA: Diagnosis not present

## 2022-04-11 ENCOUNTER — Other Ambulatory Visit: Payer: Self-pay | Admitting: Family Medicine

## 2022-04-11 DIAGNOSIS — I10 Essential (primary) hypertension: Secondary | ICD-10-CM

## 2022-04-12 ENCOUNTER — Other Ambulatory Visit: Payer: Self-pay

## 2022-04-12 ENCOUNTER — Other Ambulatory Visit: Payer: Self-pay | Admitting: Family Medicine

## 2022-04-12 DIAGNOSIS — Z13820 Encounter for screening for osteoporosis: Secondary | ICD-10-CM

## 2022-04-12 DIAGNOSIS — I10 Essential (primary) hypertension: Secondary | ICD-10-CM

## 2022-05-01 ENCOUNTER — Other Ambulatory Visit: Payer: Self-pay | Admitting: Family Medicine

## 2022-05-18 ENCOUNTER — Other Ambulatory Visit: Payer: Self-pay | Admitting: Family Medicine

## 2022-05-18 ENCOUNTER — Other Ambulatory Visit: Payer: Self-pay | Admitting: Physician Assistant

## 2022-05-18 DIAGNOSIS — I10 Essential (primary) hypertension: Secondary | ICD-10-CM

## 2022-06-16 DIAGNOSIS — H26492 Other secondary cataract, left eye: Secondary | ICD-10-CM | POA: Diagnosis not present

## 2022-06-16 DIAGNOSIS — Z135 Encounter for screening for eye and ear disorders: Secondary | ICD-10-CM | POA: Diagnosis not present

## 2022-06-16 DIAGNOSIS — H524 Presbyopia: Secondary | ICD-10-CM | POA: Diagnosis not present

## 2022-06-18 DIAGNOSIS — D485 Neoplasm of uncertain behavior of skin: Secondary | ICD-10-CM | POA: Diagnosis not present

## 2022-06-18 DIAGNOSIS — L814 Other melanin hyperpigmentation: Secondary | ICD-10-CM | POA: Diagnosis not present

## 2022-06-18 DIAGNOSIS — I781 Nevus, non-neoplastic: Secondary | ICD-10-CM | POA: Diagnosis not present

## 2022-06-18 DIAGNOSIS — L820 Inflamed seborrheic keratosis: Secondary | ICD-10-CM | POA: Diagnosis not present

## 2022-06-18 DIAGNOSIS — D2239 Melanocytic nevi of other parts of face: Secondary | ICD-10-CM | POA: Diagnosis not present

## 2022-06-18 DIAGNOSIS — L219 Seborrheic dermatitis, unspecified: Secondary | ICD-10-CM | POA: Diagnosis not present

## 2022-07-08 ENCOUNTER — Other Ambulatory Visit: Payer: Self-pay | Admitting: Family Medicine

## 2022-07-08 DIAGNOSIS — I10 Essential (primary) hypertension: Secondary | ICD-10-CM

## 2022-07-11 DIAGNOSIS — R519 Headache, unspecified: Secondary | ICD-10-CM | POA: Diagnosis not present

## 2022-07-11 DIAGNOSIS — I10 Essential (primary) hypertension: Secondary | ICD-10-CM | POA: Diagnosis not present

## 2022-07-11 DIAGNOSIS — M542 Cervicalgia: Secondary | ICD-10-CM | POA: Diagnosis not present

## 2022-07-11 DIAGNOSIS — Z6830 Body mass index (BMI) 30.0-30.9, adult: Secondary | ICD-10-CM | POA: Diagnosis not present

## 2022-07-14 DIAGNOSIS — R519 Headache, unspecified: Secondary | ICD-10-CM | POA: Diagnosis not present

## 2022-07-22 DIAGNOSIS — M542 Cervicalgia: Secondary | ICD-10-CM | POA: Diagnosis not present

## 2022-07-22 DIAGNOSIS — R519 Headache, unspecified: Secondary | ICD-10-CM | POA: Diagnosis not present

## 2022-07-22 DIAGNOSIS — M47812 Spondylosis without myelopathy or radiculopathy, cervical region: Secondary | ICD-10-CM | POA: Diagnosis not present

## 2022-08-05 ENCOUNTER — Other Ambulatory Visit: Payer: Medicare HMO

## 2022-08-08 ENCOUNTER — Ambulatory Visit: Payer: Medicare HMO | Admitting: Family Medicine

## 2022-08-09 DIAGNOSIS — I2584 Coronary atherosclerosis due to calcified coronary lesion: Secondary | ICD-10-CM | POA: Diagnosis not present

## 2022-08-16 DIAGNOSIS — M542 Cervicalgia: Secondary | ICD-10-CM | POA: Diagnosis not present

## 2022-08-17 DIAGNOSIS — L820 Inflamed seborrheic keratosis: Secondary | ICD-10-CM | POA: Diagnosis not present

## 2022-08-17 DIAGNOSIS — L719 Rosacea, unspecified: Secondary | ICD-10-CM | POA: Diagnosis not present

## 2022-08-20 ENCOUNTER — Other Ambulatory Visit: Payer: Self-pay | Admitting: Family Medicine

## 2022-08-20 DIAGNOSIS — I10 Essential (primary) hypertension: Secondary | ICD-10-CM

## 2022-08-23 DIAGNOSIS — M542 Cervicalgia: Secondary | ICD-10-CM | POA: Diagnosis not present

## 2022-08-30 DIAGNOSIS — M542 Cervicalgia: Secondary | ICD-10-CM | POA: Diagnosis not present

## 2022-09-07 DIAGNOSIS — M542 Cervicalgia: Secondary | ICD-10-CM | POA: Diagnosis not present

## 2022-09-16 DIAGNOSIS — M542 Cervicalgia: Secondary | ICD-10-CM | POA: Diagnosis not present

## 2022-09-27 DIAGNOSIS — D2239 Melanocytic nevi of other parts of face: Secondary | ICD-10-CM | POA: Diagnosis not present

## 2022-09-27 DIAGNOSIS — L821 Other seborrheic keratosis: Secondary | ICD-10-CM | POA: Diagnosis not present

## 2022-09-27 DIAGNOSIS — D225 Melanocytic nevi of trunk: Secondary | ICD-10-CM | POA: Diagnosis not present

## 2022-09-27 DIAGNOSIS — I781 Nevus, non-neoplastic: Secondary | ICD-10-CM | POA: Diagnosis not present

## 2022-09-27 DIAGNOSIS — D485 Neoplasm of uncertain behavior of skin: Secondary | ICD-10-CM | POA: Diagnosis not present

## 2022-09-29 DIAGNOSIS — M542 Cervicalgia: Secondary | ICD-10-CM | POA: Diagnosis not present

## 2022-10-11 ENCOUNTER — Other Ambulatory Visit: Payer: Self-pay | Admitting: Family Medicine

## 2022-10-11 DIAGNOSIS — I10 Essential (primary) hypertension: Secondary | ICD-10-CM

## 2022-10-13 DIAGNOSIS — M542 Cervicalgia: Secondary | ICD-10-CM | POA: Diagnosis not present

## 2022-10-18 DIAGNOSIS — C44629 Squamous cell carcinoma of skin of left upper limb, including shoulder: Secondary | ICD-10-CM | POA: Diagnosis not present

## 2022-10-25 ENCOUNTER — Other Ambulatory Visit: Payer: Self-pay | Admitting: Family Medicine

## 2022-10-27 DIAGNOSIS — M542 Cervicalgia: Secondary | ICD-10-CM | POA: Diagnosis not present

## 2022-11-22 ENCOUNTER — Other Ambulatory Visit: Payer: Self-pay | Admitting: Family Medicine

## 2022-11-22 DIAGNOSIS — I10 Essential (primary) hypertension: Secondary | ICD-10-CM

## 2022-12-16 DIAGNOSIS — U071 COVID-19: Secondary | ICD-10-CM | POA: Diagnosis not present

## 2023-01-14 ENCOUNTER — Other Ambulatory Visit: Payer: Self-pay | Admitting: Family Medicine

## 2023-01-14 DIAGNOSIS — I10 Essential (primary) hypertension: Secondary | ICD-10-CM

## 2023-01-25 ENCOUNTER — Other Ambulatory Visit: Payer: Self-pay

## 2023-01-25 DIAGNOSIS — E782 Mixed hyperlipidemia: Secondary | ICD-10-CM

## 2023-01-25 DIAGNOSIS — I10 Essential (primary) hypertension: Secondary | ICD-10-CM

## 2023-01-26 ENCOUNTER — Other Ambulatory Visit: Payer: Medicare HMO

## 2023-01-26 DIAGNOSIS — I10 Essential (primary) hypertension: Secondary | ICD-10-CM

## 2023-01-26 DIAGNOSIS — E782 Mixed hyperlipidemia: Secondary | ICD-10-CM | POA: Diagnosis not present

## 2023-01-27 ENCOUNTER — Other Ambulatory Visit: Payer: Self-pay | Admitting: Family Medicine

## 2023-01-27 LAB — LIPID PANEL
Chol/HDL Ratio: 3.8 {ratio} (ref 0.0–4.4)
Cholesterol, Total: 196 mg/dL (ref 100–199)
HDL: 51 mg/dL (ref >39)
LDL Chol Calc (NIH): 119 mg/dL — ABNORMAL HIGH (ref 0–99)
Triglycerides: 148 mg/dL (ref 0–149)
VLDL Cholesterol Cal: 26 mg/dL (ref 5–40)

## 2023-01-27 LAB — COMPREHENSIVE METABOLIC PANEL
ALT: 15 [IU]/L (ref 0–32)
AST: 21 [IU]/L (ref 0–40)
Albumin: 4.3 g/dL (ref 3.8–4.8)
Alkaline Phosphatase: 81 [IU]/L (ref 44–121)
BUN/Creatinine Ratio: 17 (ref 12–28)
BUN: 15 mg/dL (ref 8–27)
Bilirubin Total: 0.6 mg/dL (ref 0.0–1.2)
CO2: 25 mmol/L (ref 20–29)
Calcium: 9.7 mg/dL (ref 8.7–10.3)
Chloride: 96 mmol/L (ref 96–106)
Creatinine, Ser: 0.86 mg/dL (ref 0.57–1.00)
Globulin, Total: 2.5 g/dL (ref 1.5–4.5)
Glucose: 93 mg/dL (ref 70–99)
Potassium: 4.1 mmol/L (ref 3.5–5.2)
Sodium: 136 mmol/L (ref 134–144)
Total Protein: 6.8 g/dL (ref 6.0–8.5)
eGFR: 72 mL/min/{1.73_m2} (ref >59)

## 2023-01-27 LAB — CBC WITH DIFFERENTIAL/PLATELET
Basophils Absolute: 0.1 10*3/uL (ref 0.0–0.2)
Basos: 1 %
EOS (ABSOLUTE): 1.2 10*3/uL — ABNORMAL HIGH (ref 0.0–0.4)
Eos: 14 %
Hematocrit: 43.5 % (ref 34.0–46.6)
Hemoglobin: 14.5 g/dL (ref 11.1–15.9)
Immature Grans (Abs): 0 10*3/uL (ref 0.0–0.1)
Immature Granulocytes: 0 %
Lymphocytes Absolute: 1.6 10*3/uL (ref 0.7–3.1)
Lymphs: 20 %
MCH: 31.3 pg (ref 26.6–33.0)
MCHC: 33.3 g/dL (ref 31.5–35.7)
MCV: 94 fL (ref 79–97)
Monocytes Absolute: 0.7 10*3/uL (ref 0.1–0.9)
Monocytes: 9 %
Neutrophils Absolute: 4.6 10*3/uL (ref 1.4–7.0)
Neutrophils: 56 %
Platelets: 274 10*3/uL (ref 150–450)
RBC: 4.63 x10E6/uL (ref 3.77–5.28)
RDW: 11.9 % (ref 11.7–15.4)
WBC: 8.2 10*3/uL (ref 3.4–10.8)

## 2023-01-31 DIAGNOSIS — L300 Nummular dermatitis: Secondary | ICD-10-CM | POA: Diagnosis not present

## 2023-01-31 DIAGNOSIS — L299 Pruritus, unspecified: Secondary | ICD-10-CM | POA: Diagnosis not present

## 2023-01-31 DIAGNOSIS — C44629 Squamous cell carcinoma of skin of left upper limb, including shoulder: Secondary | ICD-10-CM | POA: Diagnosis not present

## 2023-02-02 ENCOUNTER — Encounter: Payer: Self-pay | Admitting: Family Medicine

## 2023-02-02 ENCOUNTER — Ambulatory Visit: Payer: Medicare HMO | Admitting: Family Medicine

## 2023-02-02 VITALS — BP 136/68 | HR 60 | Temp 95.5°F | Resp 16 | Ht 65.5 in | Wt 186.0 lb

## 2023-02-02 DIAGNOSIS — Z0000 Encounter for general adult medical examination without abnormal findings: Secondary | ICD-10-CM | POA: Diagnosis not present

## 2023-02-02 DIAGNOSIS — Z1231 Encounter for screening mammogram for malignant neoplasm of breast: Secondary | ICD-10-CM

## 2023-02-02 DIAGNOSIS — I10 Essential (primary) hypertension: Secondary | ICD-10-CM

## 2023-02-02 DIAGNOSIS — E6609 Other obesity due to excess calories: Secondary | ICD-10-CM

## 2023-02-02 DIAGNOSIS — K219 Gastro-esophageal reflux disease without esophagitis: Secondary | ICD-10-CM

## 2023-02-02 DIAGNOSIS — E782 Mixed hyperlipidemia: Secondary | ICD-10-CM

## 2023-02-02 DIAGNOSIS — E66811 Obesity, class 1: Secondary | ICD-10-CM

## 2023-02-02 DIAGNOSIS — Z6830 Body mass index (BMI) 30.0-30.9, adult: Secondary | ICD-10-CM | POA: Diagnosis not present

## 2023-02-02 MED ORDER — METOPROLOL SUCCINATE ER 100 MG PO TB24
ORAL_TABLET | ORAL | 1 refills | Status: DC
Start: 2023-02-02 — End: 2023-08-18

## 2023-02-02 MED ORDER — LORATADINE 10 MG PO TABS: 10.0000 mg | ORAL_TABLET | Freq: Every day | ORAL | 3 refills | Status: DC

## 2023-02-02 MED ORDER — OLMESARTAN MEDOXOMIL 40 MG PO TABS
40.0000 mg | ORAL_TABLET | Freq: Every day | ORAL | 1 refills | Status: DC
Start: 2023-02-02 — End: 2023-08-18

## 2023-02-02 MED ORDER — DIAZEPAM 5 MG PO TABS: 5.0000 mg | ORAL_TABLET | Freq: Two times a day (BID) | ORAL | 0 refills | Status: DC | PRN

## 2023-02-02 MED ORDER — PRAVASTATIN SODIUM 10 MG PO TABS
10.0000 mg | ORAL_TABLET | Freq: Every day | ORAL | 1 refills | Status: DC
Start: 2023-02-02 — End: 2023-08-18

## 2023-02-02 MED ORDER — CHLORTHALIDONE 50 MG PO TABS
50.0000 mg | ORAL_TABLET | Freq: Every day | ORAL | 1 refills | Status: DC
Start: 2023-02-02 — End: 2023-08-18

## 2023-02-02 MED ORDER — FAMOTIDINE 20 MG PO TABS
20.0000 mg | ORAL_TABLET | Freq: Every day | ORAL | 3 refills | Status: DC
Start: 2023-02-02 — End: 2023-08-18

## 2023-02-02 NOTE — Progress Notes (Signed)
Subjective:   Kimberly Whitney is a 72 y.o. female who presents for Medicare Annual (Subsequent) preventive examination.  Visit Complete: In person  Patient Medicare AWV questionnaire was completed by the patient on 02/02/2023; I have confirmed that all information answered by patient is correct and no changes since this date.     Hyperlipidemia: Patient is pravastatin 10 mg before bed. She is eating a low fat diet. LDL little up.  Hypertension: Current medications: toprol xl 100 mg  daily, losartan 100 mg daily, chlorthalidone 50 mg daily.  Diet: healthy Exercise:active   Allergies: loratidine 10 mg daily.  Labs reviewed with patient.   Review of Systems  Constitutional:  Negative for chills, fever and malaise/fatigue.  HENT:  Negative for congestion, ear pain and sore throat.   Respiratory:  Negative for cough and shortness of breath.   Cardiovascular:  Negative for chest pain and palpitations.  Gastrointestinal:  Negative for abdominal pain, constipation, diarrhea, nausea and vomiting.  Genitourinary:  Negative for dysuria and urgency.  Musculoskeletal:  Negative for myalgias.  Neurological:  Negative for dizziness and headaches.  Psychiatric/Behavioral:  Negative for depression. The patient is not nervous/anxious.     Objective:    Today's Vitals   02/02/23 1015 02/02/23 1025  BP: (!) 160/70 136/68  Pulse: 60   Resp: 16   Temp: (!) 95.5 F (35.3 C)   Weight: 186 lb (84.4 kg)   Height: 5' 5.5" (1.664 m)    Body mass index is 30.48 kg/m.  Physical Exam Vitals reviewed.  Constitutional:      Appearance: Normal appearance. She is obese.  Neck:     Vascular: No carotid bruit.  Cardiovascular:     Rate and Rhythm: Normal rate and regular rhythm.     Heart sounds: Normal heart sounds.  Pulmonary:     Effort: Pulmonary effort is normal. No respiratory distress.     Breath sounds: Normal breath sounds.  Abdominal:     General: Abdomen is flat. Bowel sounds are  normal.     Palpations: Abdomen is soft.     Tenderness: There is no abdominal tenderness.  Neurological:     Mental Status: She is alert and oriented to person, place, and time.  Psychiatric:        Mood and Affect: Mood normal.        Behavior: Behavior normal.        01/31/2022    2:18 PM 11/15/2020    2:25 PM 01/29/2020    6:11 PM 01/24/2020    8:06 AM 01/23/2020    3:16 PM  Advanced Directives  Does Patient Have a Medical Advance Directive? No No No  No  Would patient like information on creating a medical advance directive? Yes (MAU/Ambulatory/Procedural Areas - Information given)  No - Patient declined No - Patient declined     Current Medications (verified) Outpatient Encounter Medications as of 02/02/2023  Medication Sig   calcium-vitamin D (OSCAL WITH D) 500-200 MG-UNIT tablet Take 1 tablet by mouth 2 (two) times daily.   chlorthalidone (HYGROTON) 50 MG tablet Take 1 tablet (50 mg total) by mouth daily.   cholecalciferol (VITAMIN D3) 25 MCG (1000 UNIT) tablet Take 2,000 Units by mouth daily.   diazepam (VALIUM) 5 MG tablet Take 1 tablet (5 mg total) by mouth 2 (two) times daily as needed for anxiety.   famotidine (PEPCID) 20 MG tablet Take 1 tablet (20 mg total) by mouth daily.   loratadine (CLARITIN) 10 MG tablet  Take 1 tablet (10 mg total) by mouth daily.   metoprolol succinate (TOPROL-XL) 100 MG 24 hr tablet TAKE 1 TABLET BY MOUTH ONCE DAILY WITH  OR  IMMEDIATELY  FOLLOWING  A  MEAL   olmesartan (BENICAR) 40 MG tablet Take 1 tablet (40 mg total) by mouth daily.   pravastatin (PRAVACHOL) 10 MG tablet Take 1 tablet (10 mg total) by mouth daily.   triamcinolone cream (KENALOG) 0.1 % Apply 1 application topically 2 (two) times daily.   [DISCONTINUED] chlorthalidone (HYGROTON) 50 MG tablet Take 1 tablet by mouth once daily   [DISCONTINUED] diazepam (VALIUM) 2 MG tablet Take 1 tablet (2 mg total) by mouth 2 (two) times daily as needed for anxiety.   [DISCONTINUED] famotidine  (PEPCID) 20 MG tablet Take 1 tablet (20 mg total) by mouth daily.   [DISCONTINUED] loratadine (CLARITIN) 10 MG tablet Take 10 mg by mouth daily.   [DISCONTINUED] metoprolol succinate (TOPROL-XL) 100 MG 24 hr tablet TAKE 1 TABLET BY MOUTH ONCE DAILY WITH OR IMMEDIATELY FOLLOWING A MEAL   [DISCONTINUED] olmesartan (BENICAR) 40 MG tablet Take 1 tablet by mouth once daily   [DISCONTINUED] pravastatin (PRAVACHOL) 10 MG tablet Take 1 tablet by mouth once daily   No facility-administered encounter medications on file as of 02/02/2023.    Allergies (verified) Amlodipine, Lisinopril, and Lidocaine   History: Past Medical History:  Diagnosis Date   Basal cell carcinoma (BCC) 09/2021   right forearm   Cancer (HCC)    Closed nondisplaced intertrochanteric fracture of left femur (HCC)    Hypertension    Intertrochanteric fracture of left hip (HCC) 01/29/2020   Osteopenia    Squamous cell cancer of skin of forearm, left    Squamous cell carcinoma of leg, left    Past Surgical History:  Procedure Laterality Date   BREAST EXCISIONAL BIOPSY Right    years ago   CHOLECYSTECTOMY     FRACTURE SURGERY     L hip 30 years ago   INTRAMEDULLARY (IM) NAIL INTERTROCHANTERIC Left 01/23/2020   Procedure: INTRAMEDULLARY (IM) NAIL INTERTROCHANTRIC;  Surgeon: Tarry Kos, MD;  Location: [facility] OR;  Service: Orthopedics;  Laterality: Left;   right cataract  05/2021   Family History  Problem Relation Age of Onset   Transient ischemic attack Mother    Dementia Mother    Cancer Father    Hypertension Father    Osteoarthritis Sister    Depression Sister    Melanoma Brother    Basal cell carcinoma Brother    Squamous cell carcinoma Brother    Breast cancer Maternal Aunt    Breast cancer Maternal Aunt    Breast cancer Paternal Aunt    Pancreatic cancer Paternal Uncle    Esophageal cancer Other    Social History   Socioeconomic History   Marital status: Married    Spouse name: Not on file   Number of  children: 3   Years of education: Not on file   Highest education level: Not on file  Occupational History   Not on file  Tobacco Use   Smoking status: Former    Current packs/day: 0.00    Types: Cigarettes    Quit date: 1980    Years since quitting: 44.8   Smokeless tobacco: Never  Substance and Sexual Activity   Alcohol use: Never   Drug use: Never   Sexual activity: Not on file  Other Topics Concern   Not on file  Social History Narrative   Not on  file   Social Determinants of Health   Financial Resource Strain: Low Risk  (01/29/2023)   Overall Financial Resource Strain (CARDIA)    Difficulty of Paying Living Expenses: Not hard at all  Food Insecurity: No Food Insecurity (01/29/2023)   Hunger Vital Sign    Worried About Running Out of Food in the Last Year: Never true    Ran Out of Food in the Last Year: Never true  Transportation Needs: No Transportation Needs (01/29/2023)   PRAPARE - Administrator, Civil Service (Medical): No    Lack of Transportation (Non-Medical): No  Physical Activity: Sufficiently Active (01/29/2023)   Exercise Vital Sign    Days of Exercise per Week: 3 days    Minutes of Exercise per Session: 70 min  Stress: No Stress Concern Present (01/29/2023)   [employer] of Occupational Health - Occupational Stress Questionnaire    Feeling of Stress : Not at all  Social Connections: Unknown (01/29/2023)   Social Connection and Isolation Panel [NHANES]    Frequency of Communication with Friends and Family: More than three times a week    Frequency of Social Gatherings with Friends and Family: Twice a week    Attends Religious Services: Not on Marketing executive or Organizations: Patient declined    Attends Banker Meetings: Patient declined    Marital Status: Married    Tobacco Counseling Counseling given: Not Answered   Clinical Intake:  Pre-visit preparation completed: Yes  Pain : No/denies pain      Nutritional Status: BMI > 30  Obese Nutritional Risks: None Diabetes: No  How often do you need to have someone help you when you read instructions, pamphlets, or other written materials from your doctor or pharmacy?: 1 - Never  Interpreter Needed?: No      Activities of Daily Living    01/29/2023   11:06 AM  In your present state of health, do you have any difficulty performing the following activities:  Hearing? 0  Vision? 0  Difficulty concentrating or making decisions? 0  Walking or climbing stairs? 0  Dressing or bathing? 0  Doing errands, shopping? 0  Preparing Food and eating ? N  Using the Toilet? N  In the past six months, have you accidently leaked urine? N  Do you have problems with loss of bowel control? N  Managing your Medications? N  Managing your Finances? N  Housekeeping or managing your Housekeeping? N    Patient Care Team: Blane Ohara, MD as PCP - General (Family Medicine) Tarry Kos, MD as Attending Physician (Orthopedic Surgery)  Indicate any recent Medical Services you may have received from other than [facility] providers in the past year (date may be approximate).    This is a routine wellness examination for Jalei.  Hearing/Vision screen No results found.   Depression Screen    02/02/2023   10:26 AM 01/31/2022    1:59 PM 11/15/2020    2:23 PM 10/09/2020   10:25 AM  PHQ 2/9 Scores  PHQ - 2 Score 0 0 0 0  PHQ- 9 Score 0 0      Fall Risk    02/02/2023   10:26 AM 01/29/2023   11:06 AM 01/31/2022    2:00 PM 01/30/2022    4:12 PM 11/15/2020    2:25 PM  Fall Risk   Falls in the past year? 0 0 0 0   Number falls in past yr: 0 0  0    Injury with Fall? 0  0    Risk for fall due to : No Fall Risks  No Fall Risks  No Fall Risks  Follow up Falls evaluation completed;Falls prevention discussed  Falls evaluation completed  Falls evaluation completed    MEDICARE RISK AT HOME: Medicare Risk at Home Any stairs in or around the home?: Yes If so,  are there any without handrails?: Yes Home free of loose throw rugs in walkways, pet beds, electrical cords, etc?: Yes Adequate lighting in your home to reduce risk of falls?: Yes Life alert?: No Use of a cane, walker or w/c?: No Grab bars in the bathroom?: Yes Shower chair or bench in shower?: No Elevated toilet seat or a handicapped toilet?: Yes  TIMED UP AND GO:  Was the test performed?  Yes  Length of time to ambulate 10 feet: 5 sec Gait steady and fast without use of assistive device    Cognitive Function:        Immunizations Immunization History  Administered Date(s) Administered   Influenza Split 01/28/2017   Influenza,inj,quad, With Preservative 02/12/2013   Pneumococcal Conjugate-13 07/19/2016   Pneumococcal Polysaccharide-23 08/01/2017   Tdap 11/29/2006, 10/30/2018   Zoster, Live 05/23/2013   Screening Tests Health Maintenance  Topic Date Due   Hepatitis C Screening  Never done   Zoster Vaccines- Shingrix (1 of 2) 06/03/1970   INFLUENZA VACCINE  07/31/2023 (Originally 12/01/2022)   COVID-19 Vaccine (1) 01/31/2024 (Originally 06/03/1955)   MAMMOGRAM  02/24/2023   Medicare Annual Wellness (AWV)  02/02/2024   Colonoscopy  09/08/2026   DTaP/Tdap/Td (3 - Td or Tdap) 10/29/2028   Pneumonia Vaccine 54+ Years old  Completed   DEXA SCAN  Completed   HPV VACCINES  Aged Out    Health Maintenance  Health Maintenance Due  Topic Date Due   Hepatitis C Screening  Never done   Zoster Vaccines- Shingrix (1 of 2) 06/03/1970   Vision Screening: Recommended annual ophthalmology exams for early detection of glaucoma and other disorders of the eye. Is the patient up to date with their annual eye exam?  Yes  Who is the provider or what is the name of the office in which the patient attends annual eye exams? Dr. Mardella Layman ([facility])  Dental Screening: Recommended annual dental exams for proper oral hygiene  Community Resource Referral / Chronic Care Management: CRR  required this visit?  No   CCM required this visit?  No     Assessment/Plan:    Encounter for Medicare annual wellness exam Assessment & Plan: Things to do to keep yourself healthy  - Exercise at least 30-45 minutes a day, 3-4 days a week.  - Eat a low-fat diet with lots of fruits and vegetables, up to 7-9 servings per day.  - Seatbelts can save your life. Wear them always.  - Smoke detectors on every level of your home, check batteries every year.  - Eye Doctor - have an eye exam every 1-2 years  - Alcohol -  If you drink, do it moderately, less than 2 drinks per day.  - Health Care Power of Attorney. Choose someone to speak for you if you are not able. Please bring a copy of these once you have them done.  - Depression is common in our stressful world.If you're feeling down or losing interest in things you normally enjoy, please come in for a visit.    Mixed hyperlipidemia Assessment & Plan: Well controlled.  No changes to medicines. Continue pravastatin 10 mg before bed. Continue to work on eating a healthy diet and exercise.    Orders: -     Pravastatin Sodium; Take 1 tablet (10 mg total) by mouth daily.  Dispense: 90 tablet; Refill: 1  Essential hypertension Assessment & Plan: Well controlled.  No changes to medicines. Continue toprol xl 100 mg  daily, losartan 100 mg daily, chlorthalidone 50 mg daily. Continue to work on eating a healthy diet and exercise.    Orders: -     Chlorthalidone; Take 1 tablet (50 mg total) by mouth daily.  Dispense: 90 tablet; Refill: 1 -     Metoprolol Succinate ER; TAKE 1 TABLET BY MOUTH ONCE DAILY WITH  OR  IMMEDIATELY  FOLLOWING  A  MEAL  Dispense: 90 tablet; Refill: 1 -     Olmesartan Medoxomil; Take 1 tablet (40 mg total) by mouth daily.  Dispense: 90 tablet; Refill: 1  Gastroesophageal reflux disease without esophagitis Assessment & Plan: Well-controlled on Pepcid  Orders: -     Famotidine; Take 1 tablet (20 mg total) by mouth  daily.  Dispense: 90 tablet; Refill: 3  Encounter for screening mammogram for malignant neoplasm of breast Assessment & Plan: Check mammogram.  Orders: -     3D Screening Mammogram, Left and Right; Future  Class 1 obesity due to excess calories with serious comorbidity and body mass index (BMI) of 30.0 to 30.9 in adult Assessment & Plan: Recommend continue to work on eating healthy diet and exercise.    Other orders -     diazePAM; Take 1 tablet (5 mg total) by mouth 2 (two) times daily as needed for anxiety.  Dispense: 20 tablet; Refill: 0 -     Loratadine; Take 1 tablet (10 mg total) by mouth daily.  Dispense: 90 tablet; Refill: 3    I have personally reviewed and noted the following in the patient's chart:   Medical and social history Use of alcohol, tobacco or illicit drugs  Current medications and supplements including opioid prescriptions. Patient is not currently taking opioid prescriptions. Functional ability and status Nutritional status Physical activity Advanced directives List of other physicians Hospitalizations, surgeries, and ER visits in previous 12 months Vitals Screenings to include cognitive, depression, and falls Referrals and appointments  In addition, I have reviewed and discussed with patient certain preventive protocols, quality metrics, and best practice recommendations. A written personalized care plan for preventive services as well as general preventive health recommendations were provided to patient.    Clayborn Bigness I Leal-Borjas,acting as a scribe for Blane Ohara, MD.,have documented all relevant documentation on the behalf of Blane Ohara, MD,as directed by  Blane Ohara, MD while in the presence of Blane Ohara, MD.                                      I attest that I have reviewed this visit and agree with the plan scribed by my staff.   Blane Ohara, MD [facility] [phone]

## 2023-02-04 NOTE — Assessment & Plan Note (Addendum)
Well controlled.  No changes to medicines. Continue toprol xl 100 mg  daily, losartan 100 mg daily, chlorthalidone 50 mg daily. Continue to work on eating a healthy diet and exercise.

## 2023-02-04 NOTE — Assessment & Plan Note (Addendum)
Well controlled.  No changes to medicines. Continue pravastatin 10 mg before bed. Continue to work on eating a healthy diet and exercise.

## 2023-02-04 NOTE — Assessment & Plan Note (Signed)
Well controlled on Pepcid

## 2023-02-04 NOTE — Assessment & Plan Note (Signed)
Things to do to keep yourself healthy  - Exercise at least 30-45 minutes a day, 3-4 days a week.  - Eat a low-fat diet with lots of fruits and vegetables, up to 7-9 servings per day.  - Seatbelts can save your life. Wear them always.  - Smoke detectors on every level of your home, check batteries every year.  - Eye Doctor - have an eye exam every 1-2 years  - Alcohol -  If you drink, do it moderately, less than 2 drinks per day.  - [place]. Choose someone to speak for you if you are not able. Please bring a copy of these once you have them done.  - Depression is common in our stressful world.If you're feeling down or losing interest in things you normally enjoy, please come in for a visit.

## 2023-02-12 DIAGNOSIS — E6609 Other obesity due to excess calories: Secondary | ICD-10-CM | POA: Insufficient documentation

## 2023-02-12 NOTE — Assessment & Plan Note (Signed)
Check mammogram

## 2023-02-12 NOTE — Assessment & Plan Note (Signed)
Recommend continue to work on eating healthy diet and exercise.  

## 2023-03-08 ENCOUNTER — Ambulatory Visit
Admission: RE | Admit: 2023-03-08 | Discharge: 2023-03-08 | Disposition: A | Discharge status: Home / Self Care | Payer: Medicare HMO | Source: Ambulatory Visit | Attending: Family Medicine | Admitting: Family Medicine

## 2023-03-08 DIAGNOSIS — Z1231 Encounter for screening mammogram for malignant neoplasm of breast: Secondary | ICD-10-CM | POA: Diagnosis not present

## 2023-04-10 ENCOUNTER — Other Ambulatory Visit: Payer: Self-pay | Admitting: Family Medicine

## 2023-04-10 ENCOUNTER — Encounter: Payer: Self-pay | Admitting: Family Medicine

## 2023-04-10 MED ORDER — OMEPRAZOLE 40 MG PO CPDR: 40.0000 mg | DELAYED_RELEASE_CAPSULE | Freq: Every day | ORAL | 0 refills | Status: DC

## 2023-06-20 DIAGNOSIS — H524 Presbyopia: Secondary | ICD-10-CM | POA: Diagnosis not present

## 2023-07-17 ENCOUNTER — Other Ambulatory Visit: Payer: Self-pay

## 2023-07-17 DIAGNOSIS — I10 Essential (primary) hypertension: Secondary | ICD-10-CM

## 2023-07-17 DIAGNOSIS — E782 Mixed hyperlipidemia: Secondary | ICD-10-CM

## 2023-07-18 ENCOUNTER — Other Ambulatory Visit: Payer: Medicare HMO

## 2023-07-18 DIAGNOSIS — I10 Essential (primary) hypertension: Secondary | ICD-10-CM

## 2023-07-18 DIAGNOSIS — E782 Mixed hyperlipidemia: Secondary | ICD-10-CM | POA: Diagnosis not present

## 2023-07-18 LAB — COMPREHENSIVE METABOLIC PANEL
ALT: 23 IU/L (ref 0–32)
AST: 21 IU/L (ref 0–40)
Albumin: 4.4 g/dL (ref 3.8–4.8)
Alkaline Phosphatase: 80 IU/L (ref 44–121)
BUN/Creatinine Ratio: 20 (ref 12–28)
BUN: 17 mg/dL (ref 8–27)
Bilirubin Total: 0.7 mg/dL (ref 0.0–1.2)
CO2: 25 mmol/L (ref 20–29)
Calcium: 9.7 mg/dL (ref 8.7–10.3)
Chloride: 97 mmol/L (ref 96–106)
Creatinine, Ser: 0.85 mg/dL (ref 0.57–1.00)
Globulin, Total: 2.4 g/dL (ref 1.5–4.5)
Glucose: 92 mg/dL (ref 70–99)
Potassium: 3.8 mmol/L (ref 3.5–5.2)
Sodium: 137 mmol/L (ref 134–144)
Total Protein: 6.8 g/dL (ref 6.0–8.5)
eGFR: 73 mL/min/{1.73_m2} (ref >59)

## 2023-07-18 LAB — LIPID PANEL
Chol/HDL Ratio: 3.7 ratio (ref 0.0–4.4)
Cholesterol, Total: 181 mg/dL (ref 100–199)
HDL: 49 mg/dL (ref >39)
LDL Chol Calc (NIH): 111 mg/dL — ABNORMAL HIGH (ref 0–99)
Triglycerides: 116 mg/dL (ref 0–149)
VLDL Cholesterol Cal: 21 mg/dL (ref 5–40)

## 2023-07-18 LAB — CBC WITH DIFFERENTIAL/PLATELET
Basophils Absolute: 0.1 10*3/uL (ref 0.0–0.2)
Basos: 1 %
EOS (ABSOLUTE): 1 10*3/uL — ABNORMAL HIGH (ref 0.0–0.4)
Eos: 12 %
Hematocrit: 42.2 % (ref 34.0–46.6)
Hemoglobin: 14.5 g/dL (ref 11.1–15.9)
Immature Grans (Abs): 0 10*3/uL (ref 0.0–0.1)
Immature Granulocytes: 0 %
Lymphocytes Absolute: 1.8 10*3/uL (ref 0.7–3.1)
Lymphs: 22 %
MCH: 31.2 pg (ref 26.6–33.0)
MCHC: 34.4 g/dL (ref 31.5–35.7)
MCV: 91 fL (ref 79–97)
Monocytes Absolute: 0.6 10*3/uL (ref 0.1–0.9)
Monocytes: 8 %
Neutrophils Absolute: 4.5 10*3/uL (ref 1.4–7.0)
Neutrophils: 57 %
Platelets: 267 10*3/uL (ref 150–450)
RBC: 4.65 x10E6/uL (ref 3.77–5.28)
RDW: 11.8 % (ref 11.7–15.4)
WBC: 8 10*3/uL (ref 3.4–10.8)

## 2023-07-19 NOTE — Progress Notes (Unsigned)
 Subjective:  Patient ID: Kimberly Whitney, female    DOB: August 09, 1950  Age: 73 y.o. MRN: [id number]  Chief Complaint  Patient presents with   Medical Management of Chronic Issues   Discussed the use of AI scribe software for clinical note transcription with the patient, who gave verbal consent to proceed.  History of Present Illness      Hyperlipidemia: Patient is taking pravastatin 10 mg before bed.   Hypertension: Current medications: toprol xl 100 mg  daily, losartan 100 mg daily, chlorthalidone 50 mg daily.  GERD: Famotidine 20 mg 1 tablet once daily.  Diet: healthy Exercise:active   Allergies: loratidine 10 mg daily.  Psoriasis: VTAMA 1% cream as needed .       07/20/2023   10:42 AM 02/02/2023   10:26 AM 01/31/2022    1:59 PM 11/15/2020    2:23 PM 10/09/2020   10:25 AM  Depression screen PHQ 2/9  Decreased Interest 0 0 0 0 0  Down, Depressed, Hopeless 0 0 0 0 0  PHQ - 2 Score 0 0 0 0 0  Altered sleeping  0 0    Tired, decreased energy  0 0    Change in appetite  0 0    Feeling bad or failure about yourself   0 0    Trouble concentrating  0 0    Moving slowly or fidgety/restless  0 0    Suicidal thoughts  0 0    PHQ-9 Score  0 0    Difficult doing work/chores  Not difficult at all Not difficult at all          07/20/2023   10:42 AM  Fall Risk   Falls in the past year? 0  Number falls in past yr: 0  Injury with Fall? 0  Risk for fall due to : No Fall Risks  Follow up Falls evaluation completed    Patient Care Team: Blane Ohara, MD as PCP - General (Family Medicine) Tarry Kos, MD as Attending Physician (Orthopedic Surgery)   Review of Systems  Constitutional:  Positive for fatigue. Negative for appetite change and fever.  HENT:  Positive for rhinorrhea, sneezing and voice change. Negative for congestion, ear pain, sinus pressure and sore throat.   Respiratory:  Negative for cough, chest tightness, shortness of breath and wheezing.   Cardiovascular:   Negative for chest pain and palpitations.  Gastrointestinal:  Positive for constipation. Negative for abdominal pain, diarrhea, nausea and vomiting.  Genitourinary:  Positive for frequency (increased mild.). Negative for dysuria and hematuria.  Musculoskeletal:  Negative for arthralgias, back pain, joint swelling and myalgias.  Skin:  Negative for rash.  Neurological:  Negative for dizziness, weakness and headaches.  Psychiatric/Behavioral:  Negative for dysphoric mood. The patient is not nervous/anxious.     Current Outpatient Medications on File Prior to Visit  Medication Sig Dispense Refill   calcium-vitamin D (OSCAL WITH D) 500-200 MG-UNIT tablet Take 1 tablet by mouth 2 (two) times daily.     chlorthalidone (HYGROTON) 50 MG tablet Take 1 tablet (50 mg total) by mouth daily. 90 tablet 1   cholecalciferol (VITAMIN D3) 25 MCG (1000 UNIT) tablet Take 2,000 Units by mouth daily.     diazepam (VALIUM) 5 MG tablet Take 1 tablet (5 mg total) by mouth 2 (two) times daily as needed for anxiety. 20 tablet 0   famotidine (PEPCID) 20 MG tablet Take 1 tablet (20 mg total) by mouth daily. 90 tablet 3   metoprolol  succinate (TOPROL-XL) 100 MG 24 hr tablet TAKE 1 TABLET BY MOUTH ONCE DAILY WITH  OR  IMMEDIATELY  FOLLOWING  A  MEAL 90 tablet 1   olmesartan (BENICAR) 40 MG tablet Take 1 tablet (40 mg total) by mouth daily. 90 tablet 1   pravastatin (PRAVACHOL) 10 MG tablet Take 1 tablet (10 mg total) by mouth daily. 90 tablet 1   triamcinolone cream (KENALOG) 0.1 % Apply 1 application topically 2 (two) times daily.     No current facility-administered medications on file prior to visit.   Past Medical History:  Diagnosis Date   Basal cell carcinoma (BCC) 09/2021   right forearm   Cancer (HCC)    Closed nondisplaced intertrochanteric fracture of left femur (HCC)    Hypertension    Intertrochanteric fracture of left hip (HCC) 01/29/2020   Osteopenia    Squamous cell cancer of skin of forearm, left     Squamous cell carcinoma of leg, left    Past Surgical History:  Procedure Laterality Date   BREAST EXCISIONAL BIOPSY Right    years ago   CHOLECYSTECTOMY     FRACTURE SURGERY     L hip 30 years ago   INTRAMEDULLARY (IM) NAIL INTERTROCHANTERIC Left 01/23/2020   Procedure: INTRAMEDULLARY (IM) NAIL INTERTROCHANTRIC;  Surgeon: Tarry Kos, MD;  Location: [facility] OR;  Service: Orthopedics;  Laterality: Left;   right cataract  05/2021    Family History  Problem Relation Age of Onset   Transient ischemic attack Mother    Dementia Mother    Cancer Father    Hypertension Father    Osteoarthritis Sister    Depression Sister    Melanoma Brother    Basal cell carcinoma Brother    Squamous cell carcinoma Brother    Breast cancer Maternal Aunt    Breast cancer Maternal Aunt    Breast cancer Paternal Aunt    Pancreatic cancer Paternal Uncle    Esophageal cancer Other    Social History   Socioeconomic History   Marital status: Married    Spouse name: Not on file   Number of children: 3   Years of education: Not on file   Highest education level: 12th grade  Occupational History   Not on file  Tobacco Use   Smoking status: Former    Current packs/day: 0.00    Types: Cigarettes    Quit date: 1980    Years since quitting: 45.2   Smokeless tobacco: Never  Substance and Sexual Activity   Alcohol use: Never   Drug use: Never   Sexual activity: Not on file  Other Topics Concern   Not on file  Social History Narrative   Not on file   Social Drivers of Health   Financial Resource Strain: Low Risk  (07/17/2023)   Overall Financial Resource Strain (CARDIA)    Difficulty of Paying Living Expenses: Not hard at all  Food Insecurity: No Food Insecurity (07/17/2023)   Hunger Vital Sign    Worried About Running Out of Food in the Last Year: Never true    Ran Out of Food in the Last Year: Never true  Transportation Needs: No Transportation Needs (07/17/2023)   PRAPARE - Therapist, art (Medical): No    Lack of Transportation (Non-Medical): No  Physical Activity: Sufficiently Active (07/17/2023)   Exercise Vital Sign    Days of Exercise per Week: 4 days    Minutes of Exercise per Session: 70 min  Stress: No Stress Concern Present (07/17/2023)   [employer] of Occupational Health - Occupational Stress Questionnaire    Feeling of Stress : Not at all  Social Connections: Unknown (07/17/2023)   Social Connection and Isolation Panel [NHANES]    Frequency of Communication with Friends and Family: More than three times a week    Frequency of Social Gatherings with Friends and Family: More than three times a week    Attends Religious Services: Patient declined    Database administrator or Organizations: Yes    Attends Engineer, structural: Patient declined    Marital Status: Married    Objective:  BP 122/62 (BP Location: Left Arm, Patient Position: Sitting)   Pulse (!) 59   Temp (!) 97.2 F (36.2 C) (Temporal)   Ht 5' 5.5" (1.664 m)   Wt 190 lb (86.2 kg)   SpO2 97%   BMI 31.14 kg/m      07/20/2023   10:41 AM 02/02/2023   10:25 AM 02/02/2023   10:15 AM  BP/Weight  Systolic BP 122 136 160  Diastolic BP 62 68 70  Wt. (Lbs) 190  186  BMI 31.14 kg/m2  30.48 kg/m2    Physical Exam  Diabetic Foot Exam - Simple   No data filed      Lab Results  Component Value Date   WBC 8.0 07/18/2023   HGB 14.5 07/18/2023   HCT 42.2 07/18/2023   PLT 267 07/18/2023   GLUCOSE 92 07/18/2023   CHOL 181 07/18/2023   TRIG 116 07/18/2023   HDL 49 07/18/2023   LDLCALC 111 (H) 07/18/2023   ALT 23 07/18/2023   AST 21 07/18/2023   NA 137 07/18/2023   K 3.8 07/18/2023   CL 97 07/18/2023   CREATININE 0.85 07/18/2023   BUN 17 07/18/2023   CO2 25 07/18/2023   TSH 1.660 10/09/2020   INR 1.0 01/23/2020   HGBA1C 5.5 10/09/2020      Assessment & Plan:  Assessment and Plan       Essential hypertension Assessment & Plan: Well  controlled.  No changes to medicines. Continue toprol xl 100 mg  daily, losartan 100 mg daily, chlorthalidone 50 mg daily. Continue to work on eating a healthy diet and exercise.  Order EKG   Orders: -     POCT URINALYSIS DIP (CLINITEK) -     EKG 12-Lead  Gastroesophageal reflux disease without esophagitis Assessment & Plan: Well-controlled on Pepcid   Mixed hyperlipidemia Assessment & Plan: Well controlled.  No changes to medicines. Continue pravastatin 10 mg before bed. Continue to work on eating a healthy diet and exercise.     Post-menopausal atrophic vaginitis  Epigastric abdominal tenderness without rebound tenderness Assessment & Plan: Order H. Pylori.test  Orders: -     H. pylori antigen, stool  Acute cystitis without hematuria Assessment & Plan: Check UA  Orders: -     Urine Culture  Other orders -     Pantoprazole Sodium; Take 1 tablet (40 mg total) by mouth 2 (two) times daily.  Dispense: 60 tablet; Refill: 2     Meds ordered this encounter  Medications   pantoprazole (PROTONIX) 40 MG tablet    Sig: Take 1 tablet (40 mg total) by mouth 2 (two) times daily.    Dispense:  60 tablet    Refill:  2    Orders Placed This Encounter  Procedures   Urine Culture   H. pylori antigen, stool   POCT URINALYSIS  DIP (CLINITEK)   EKG 12-Lead     Follow-up: Return in about 4 weeks (around 08/17/2023) for chronic follow up.   I,Marla I Leal-Borjas,acting as a scribe for Blane Ohara, MD.,have documented all relevant documentation on the behalf of Blane Ohara, MD,as directed by  Blane Ohara, MD while in the presence of Blane Ohara, MD.   An After Visit Summary was printed and given to the patient.  Blane Ohara, MD [facility] [phone]

## 2023-07-20 ENCOUNTER — Encounter: Payer: Self-pay | Admitting: Family Medicine

## 2023-07-20 ENCOUNTER — Ambulatory Visit (INDEPENDENT_AMBULATORY_CARE_PROVIDER_SITE_OTHER): Payer: Medicare HMO | Admitting: Family Medicine

## 2023-07-20 VITALS — BP 122/62 | HR 59 | Temp 97.2°F | Ht 65.5 in | Wt 190.0 lb

## 2023-07-20 DIAGNOSIS — R10816 Epigastric abdominal tenderness: Secondary | ICD-10-CM | POA: Diagnosis not present

## 2023-07-20 DIAGNOSIS — K219 Gastro-esophageal reflux disease without esophagitis: Secondary | ICD-10-CM

## 2023-07-20 DIAGNOSIS — R82998 Other abnormal findings in urine: Secondary | ICD-10-CM

## 2023-07-20 DIAGNOSIS — I10 Essential (primary) hypertension: Secondary | ICD-10-CM

## 2023-07-20 DIAGNOSIS — N3000 Acute cystitis without hematuria: Secondary | ICD-10-CM

## 2023-07-20 DIAGNOSIS — N952 Postmenopausal atrophic vaginitis: Secondary | ICD-10-CM

## 2023-07-20 DIAGNOSIS — E782 Mixed hyperlipidemia: Secondary | ICD-10-CM | POA: Diagnosis not present

## 2023-07-20 LAB — POCT URINALYSIS DIP (CLINITEK)
Bilirubin, UA: NEGATIVE
Blood, UA: NEGATIVE
Glucose, UA: NEGATIVE mg/dL
Ketones, POC UA: NEGATIVE mg/dL
Nitrite, UA: NEGATIVE
POC PROTEIN,UA: NEGATIVE
Spec Grav, UA: 1.015 (ref 1.010–1.025)
Urobilinogen, UA: 0.2 U/dL
pH, UA: 6 (ref 5.0–8.0)

## 2023-07-20 MED ORDER — PANTOPRAZOLE SODIUM 40 MG PO TBEC: 40.0000 mg | DELAYED_RELEASE_TABLET | Freq: Two times a day (BID) | ORAL | 2 refills | Status: DC

## 2023-07-20 NOTE — Patient Instructions (Addendum)
 Protonix 40 mg once daily. If helicobacter pylori is positive, I will increase to twice daily.  Can continue famotidine right now. Checking for H. pylori.  Return Stool study. Decreased Toprol XL to half pill (50 mg) once a day.  Continue chlorthalidone and olmesartan.  Check blood pressure daily.  Return for blood pressure visit in 3-4 weeks. Urine showed trace leukocytes. Sent for culture. If culture is positive, will send an antibiotic.

## 2023-07-21 DIAGNOSIS — R10816 Epigastric abdominal tenderness: Secondary | ICD-10-CM | POA: Diagnosis not present

## 2023-07-22 DIAGNOSIS — N3000 Acute cystitis without hematuria: Secondary | ICD-10-CM | POA: Insufficient documentation

## 2023-07-22 DIAGNOSIS — R10816 Epigastric abdominal tenderness: Secondary | ICD-10-CM | POA: Insufficient documentation

## 2023-07-22 LAB — URINE CULTURE

## 2023-07-22 NOTE — Assessment & Plan Note (Signed)
Well controlled on Pepcid

## 2023-07-22 NOTE — Assessment & Plan Note (Signed)
 Protonix 40 mg once daily. If helicobacter pylori is positive, I will increase to twice daily and will add amoxicillin and clarithromycin. Can continue famotidine right now. Checking for H. pylori.  Return Stool study.

## 2023-07-22 NOTE — Assessment & Plan Note (Signed)
 Check UA

## 2023-07-22 NOTE — Assessment & Plan Note (Addendum)
 Well controlled.  No changes to medicines. Continue toprol xl 100 mg  daily, losartan 100 mg daily, chlorthalidone 50 mg daily. Continue to work on eating a healthy diet and exercise.  Order EKG

## 2023-07-22 NOTE — Assessment & Plan Note (Signed)
Well controlled.  No changes to medicines. Continue pravastatin 10 mg before bed. Continue to work on eating a healthy diet and exercise.

## 2023-07-23 ENCOUNTER — Encounter: Payer: Self-pay | Admitting: Family Medicine

## 2023-07-23 LAB — H. PYLORI ANTIGEN, STOOL: H pylori Ag, Stl: NEGATIVE

## 2023-08-01 ENCOUNTER — Other Ambulatory Visit: Payer: Medicare HMO

## 2023-08-03 ENCOUNTER — Ambulatory Visit: Payer: Medicare HMO | Admitting: Family Medicine

## 2023-08-18 ENCOUNTER — Encounter: Payer: Self-pay | Admitting: Family Medicine

## 2023-08-18 ENCOUNTER — Ambulatory Visit (INDEPENDENT_AMBULATORY_CARE_PROVIDER_SITE_OTHER): Admitting: Family Medicine

## 2023-08-18 VITALS — BP 138/70 | HR 60 | Temp 97.9°F | Resp 16 | Ht 65.5 in | Wt 188.8 lb

## 2023-08-18 DIAGNOSIS — J301 Allergic rhinitis due to pollen: Secondary | ICD-10-CM

## 2023-08-18 DIAGNOSIS — F418 Other specified anxiety disorders: Secondary | ICD-10-CM

## 2023-08-18 DIAGNOSIS — K219 Gastro-esophageal reflux disease without esophagitis: Secondary | ICD-10-CM

## 2023-08-18 DIAGNOSIS — I10 Essential (primary) hypertension: Secondary | ICD-10-CM

## 2023-08-18 DIAGNOSIS — E782 Mixed hyperlipidemia: Secondary | ICD-10-CM

## 2023-08-18 DIAGNOSIS — Z23 Encounter for immunization: Secondary | ICD-10-CM

## 2023-08-18 MED ORDER — PRAVASTATIN SODIUM 10 MG PO TABS: 10.0000 mg | ORAL_TABLET | Freq: Every day | ORAL | 1 refills | Status: DC

## 2023-08-18 MED ORDER — METOPROLOL SUCCINATE ER 50 MG PO TB24: 50.0000 mg | ORAL_TABLET | Freq: Every day | ORAL | 3 refills | Status: AC

## 2023-08-18 MED ORDER — OLMESARTAN MEDOXOMIL 40 MG PO TABS: 40.0000 mg | ORAL_TABLET | Freq: Every day | ORAL | 1 refills | Status: DC

## 2023-08-18 MED ORDER — CHLORTHALIDONE 50 MG PO TABS: 50.0000 mg | ORAL_TABLET | Freq: Every day | ORAL | 1 refills | Status: DC

## 2023-08-18 MED ORDER — FLUTICASONE PROPIONATE 50 MCG/ACT NA SUSP: 2.0000 | Freq: Every day | NASAL | 6 refills | Status: AC

## 2023-08-18 NOTE — Progress Notes (Signed)
 Subjective:  Patient ID: Kimberly Whitney, female    DOB: 04/07/1951  Age: 73 y.o. MRN: [id number]  Chief Complaint  Patient presents with   Medical Management of Chronic Issues   Hypertension: Current medications: Decreased to toprol  xl 50 mg daily, olmesartan  40 mg daily, chlorthalidone  50 mg daily. 120s/70s.    Allergies: loratidine 10 mg daily in am and zyrtec in the afternoon. Still having allergy symptoms.   GERD: hoarseness and reflux has resolved on protonix  40 mg daily. Continue famotidine  40 mg daily.      07/20/2023   10:42 AM 02/02/2023   10:26 AM 01/31/2022    1:59 PM 11/15/2020    2:23 PM 10/09/2020   10:25 AM  Depression screen PHQ 2/9  Decreased Interest 0 0 0 0 0  Down, Depressed, Hopeless 0 0 0 0 0  PHQ - 2 Score 0 0 0 0 0  Altered sleeping  0 0    Tired, decreased energy  0 0    Change in appetite  0 0    Feeling bad or failure about yourself   0 0    Trouble concentrating  0 0    Moving slowly or fidgety/restless  0 0    Suicidal thoughts  0 0    PHQ-9 Score  0 0    Difficult doing work/chores  Not difficult at all Not difficult at all          07/20/2023   10:42 AM  Fall Risk   Falls in the past year? 0  Number falls in past yr: 0  Injury with Fall? 0  Risk for fall due to : No Fall Risks  Follow up Falls evaluation completed    Patient Care Team: Sherre Clapper, MD as PCP - General (Family Medicine) Jerri Kay HERO, MD as Attending Physician (Orthopedic Surgery)   Review of Systems  Constitutional:  Negative for chills, fatigue and fever.  HENT:  Negative for congestion, ear pain and sore throat.   Respiratory:  Negative for cough and shortness of breath.   Cardiovascular:  Negative for chest pain.    Current Outpatient Medications on File Prior to Visit  Medication Sig Dispense Refill   aspirin EC 81 MG tablet Take 81 mg by mouth daily.     calcium-vitamin D (OSCAL WITH D) 500-200 MG-UNIT tablet Take 1 tablet by mouth 2 (two) times daily.      cetirizine (ZYRTEC) 10 MG tablet Take 10 mg by mouth daily.     cholecalciferol (VITAMIN D3) 25 MCG (1000 UNIT) tablet Take 2,000 Units by mouth daily.     MAGNESIUM  PO Take 1 each by mouth daily.     nystatin cream (MYCOSTATIN) Apply 1 Application topically 2 (two) times daily as needed for dry skin.     pantoprazole  (PROTONIX ) 40 MG tablet Take 1 tablet (40 mg total) by mouth 2 (two) times daily. 60 tablet 2   triamcinolone cream (KENALOG) 0.1 % Apply 1 application topically 2 (two) times daily.     diazepam  (VALIUM ) 5 MG tablet Take 1 tablet (5 mg total) by mouth 2 (two) times daily as needed for anxiety. (Patient not taking: Reported on 08/18/2023) 20 tablet 0   No current facility-administered medications on file prior to visit.   Past Medical History:  Diagnosis Date   Basal cell carcinoma (BCC) 09/2021   right forearm   Cancer (HCC)    Closed nondisplaced intertrochanteric fracture of left femur (HCC)    Hypertension  Intertrochanteric fracture of left hip (HCC) 01/29/2020   Osteopenia    Squamous cell cancer of skin of forearm, left    Squamous cell carcinoma of leg, left    Past Surgical History:  Procedure Laterality Date   BREAST EXCISIONAL BIOPSY Right    years ago   CHOLECYSTECTOMY     FRACTURE SURGERY     L hip 30 years ago   INTRAMEDULLARY (IM) NAIL INTERTROCHANTERIC Left 01/23/2020   Procedure: INTRAMEDULLARY (IM) NAIL INTERTROCHANTRIC;  Surgeon: Jerri Kay HERO, MD;  Location: [facility] OR;  Service: Orthopedics;  Laterality: Left;   right cataract  05/2021    Family History  Problem Relation Age of Onset   Transient ischemic attack Mother    Dementia Mother    Cancer Father    Hypertension Father    Osteoarthritis Sister    Depression Sister    Melanoma Brother    Basal cell carcinoma Brother    Squamous cell carcinoma Brother    Breast cancer Maternal Aunt    Breast cancer Maternal Aunt    Breast cancer Paternal Aunt    Pancreatic cancer Paternal Uncle     Esophageal cancer Other    Social History   Socioeconomic History   Marital status: Married    Spouse name: Not on file   Number of children: 3   Years of education: Not on file   Highest education level: 12th grade  Occupational History   Not on file  Tobacco Use   Smoking status: Former    Current packs/day: 0.00    Types: Cigarettes    Quit date: 1980    Years since quitting: 45.3   Smokeless tobacco: Never  Substance and Sexual Activity   Alcohol use: Never   Drug use: Never   Sexual activity: Not on file  Other Topics Concern   Not on file  Social History Narrative   Not on file   Social Drivers of Health   Financial Resource Strain: Low Risk  (07/17/2023)   Overall Financial Resource Strain (CARDIA)    Difficulty of Paying Living Expenses: Not hard at all  Food Insecurity: No Food Insecurity (07/17/2023)   Hunger Vital Sign    Worried About Running Out of Food in the Last Year: Never true    Ran Out of Food in the Last Year: Never true  Transportation Needs: No Transportation Needs (07/17/2023)   PRAPARE - Administrator, Civil Service (Medical): No    Lack of Transportation (Non-Medical): No  Physical Activity: Sufficiently Active (07/17/2023)   Exercise Vital Sign    Days of Exercise per Week: 4 days    Minutes of Exercise per Session: 70 min  Stress: No Stress Concern Present (07/17/2023)   [employer] of Occupational Health - Occupational Stress Questionnaire    Feeling of Stress : Not at all  Social Connections: Unknown (07/17/2023)   Social Connection and Isolation Panel [NHANES]    Frequency of Communication with Friends and Family: More than three times a week    Frequency of Social Gatherings with Friends and Family: More than three times a week    Attends Religious Services: Patient declined    Database Administrator or Organizations: Yes    Attends Banker Meetings: Patient declined    Marital Status: Married     Objective:  BP 138/70   Pulse 60   Temp 97.9 F (36.6 C) (Temporal)   Resp 16   Ht 5' 5.5 (  1.664 m)   Wt 188 lb 12.8 oz (85.6 kg)   SpO2 97%   BMI 30.94 kg/m      08/18/2023   10:26 AM 07/20/2023   10:41 AM 02/02/2023   10:25 AM  BP/Weight  Systolic BP 138 122 136  Diastolic BP 70 62 68  Wt. (Lbs) 188.8 190   BMI 30.94 kg/m2 31.14 kg/m2     Physical Exam Vitals reviewed.  Constitutional:      Appearance: Normal appearance. She is normal weight.  Neck:     Vascular: No carotid bruit.  Cardiovascular:     Rate and Rhythm: Normal rate and regular rhythm.     Heart sounds: Normal heart sounds.  Pulmonary:     Effort: Pulmonary effort is normal. No respiratory distress.     Breath sounds: Normal breath sounds.  Abdominal:     General: Abdomen is flat. Bowel sounds are normal.     Palpations: Abdomen is soft.     Tenderness: There is no abdominal tenderness.  Neurological:     Mental Status: She is alert and oriented to person, place, and time.  Psychiatric:        Mood and Affect: Mood normal.        Behavior: Behavior normal.     Diabetic Foot Exam - Simple   No data filed      Lab Results  Component Value Date   WBC 8.0 07/18/2023   HGB 14.5 07/18/2023   HCT 42.2 07/18/2023   PLT 267 07/18/2023   GLUCOSE 92 07/18/2023   CHOL 181 07/18/2023   TRIG 116 07/18/2023   HDL 49 07/18/2023   LDLCALC 111 (H) 07/18/2023   ALT 23 07/18/2023   AST 21 07/18/2023   NA 137 07/18/2023   K 3.8 07/18/2023   CL 97 07/18/2023   CREATININE 0.85 07/18/2023   BUN 17 07/18/2023   CO2 25 07/18/2023   TSH 1.660 10/09/2020   INR 1.0 01/23/2020   HGBA1C 5.5 10/09/2020      Assessment & Plan:   Essential hypertension Assessment & Plan: Well controlled.  No changes to medicines. On Toprol  XL to half pill (50 mg) once a day.  Continue chlorthalidone  and losartan .  Check blood pressure daily.   Continue to work on eating a healthy diet and exercise.      Orders: -     Olmesartan  Medoxomil; Take 1 tablet (40 mg total) by mouth daily.  Dispense: 90 tablet; Refill: 1 -     Metoprolol  Succinate ER; Take 1 tablet (50 mg total) by mouth daily. Take with or immediately following a meal.  Dispense: 90 tablet; Refill: 3 -     Chlorthalidone ; Take 1 tablet (50 mg total) by mouth daily.  Dispense: 90 tablet; Refill: 1  Gastroesophageal reflux disease without esophagitis Assessment & Plan: The current medical regimen is effective;  continue present plan and medications.  Pantoprazole  40 mg daily.   Mixed hyperlipidemia Assessment & Plan: Well controlled.  No changes to medicines. Continue pravastatin  10 mg before bed. Continue to work on eating a healthy diet and exercise.    Orders: -     Pravastatin  Sodium; Take 1 tablet (10 mg total) by mouth daily.  Dispense: 90 tablet; Refill: 1  Other specified anxiety disorders Assessment & Plan: Continue on Diazepam  2 mg one twice daily prn severe anxiety.   Seasonal allergic rhinitis due to pollen Assessment & Plan: Start on flonase .  Continue zyrtec.   Orders: -  Fluticasone  Propionate; Place 2 sprays into both nostrils daily.  Dispense: 16 g; Refill: 6     Meds ordered this encounter  Medications   pravastatin  (PRAVACHOL ) 10 MG tablet    Sig: Take 1 tablet (10 mg total) by mouth daily.    Dispense:  90 tablet    Refill:  1    Please put on file.   olmesartan  (BENICAR ) 40 MG tablet    Sig: Take 1 tablet (40 mg total) by mouth daily.    Dispense:  90 tablet    Refill:  1    Please put on file.   fluticasone  (FLONASE ) 50 MCG/ACT nasal spray    Sig: Place 2 sprays into both nostrils daily.    Dispense:  16 g    Refill:  6   metoprolol  succinate (TOPROL -XL) 50 MG 24 hr tablet    Sig: Take 1 tablet (50 mg total) by mouth daily. Take with or immediately following a meal.    Dispense:  90 tablet    Refill:  3    Please put on file.   chlorthalidone  (HYGROTON ) 50 MG tablet     Sig: Take 1 tablet (50 mg total) by mouth daily.    Dispense:  90 tablet    Refill:  1    No orders of the defined types were placed in this encounter.    Follow-up: Return in about 6 months (around 02/17/2024) for chronic follow up. AWV kim smith, lpn.  LILLETTE Kato I Leal-Borjas,acting as a scribe for Abigail Free, MD.,have documented all relevant documentation on the behalf of Abigail Free, MD,as directed by  Abigail Free, MD while in the presence of Abigail Free, MD.   An After Visit Summary was printed and given to the patient.  I attest that I have reviewed this visit and agree with the plan scribed by my staff.  Abigail Free, MD [facility] [phone]

## 2023-08-20 NOTE — Assessment & Plan Note (Signed)
The current medical regimen is effective;  continue present plan and medications. Pantoprazole 40 mg daily. 

## 2023-08-20 NOTE — Assessment & Plan Note (Signed)
 Continue on Diazepam  2 mg one twice daily prn severe anxiety.

## 2023-08-20 NOTE — Assessment & Plan Note (Signed)
 Well controlled.  No changes to medicines. On Toprol  XL to half pill (50 mg) once a day.  Continue chlorthalidone  and losartan .  Check blood pressure daily.   Continue to work on eating a healthy diet and exercise.

## 2023-08-20 NOTE — Assessment & Plan Note (Signed)
Well controlled.  No changes to medicines. Continue pravastatin 10 mg before bed. Continue to work on eating a healthy diet and exercise.

## 2023-08-21 NOTE — Assessment & Plan Note (Signed)
 Start on flonase .  Continue zyrtec.

## 2023-09-26 ENCOUNTER — Encounter: Payer: Self-pay | Admitting: Family Medicine

## 2023-09-26 ENCOUNTER — Ambulatory Visit (INDEPENDENT_AMBULATORY_CARE_PROVIDER_SITE_OTHER): Admitting: Family Medicine

## 2023-09-26 ENCOUNTER — Ambulatory Visit: Payer: Self-pay | Admitting: Family Medicine

## 2023-09-26 ENCOUNTER — Ambulatory Visit (INDEPENDENT_AMBULATORY_CARE_PROVIDER_SITE_OTHER)
Admission: RE | Admit: 2023-09-26 | Discharge: 2023-09-26 | Disposition: A | Discharge status: Home / Self Care | Source: Ambulatory Visit | Attending: Family Medicine | Admitting: Family Medicine

## 2023-09-26 VITALS — BP 188/80 | HR 61 | Temp 97.3°F | Resp 14 | Ht 65.5 in | Wt 187.0 lb

## 2023-09-26 DIAGNOSIS — R270 Ataxia, unspecified: Secondary | ICD-10-CM

## 2023-09-26 DIAGNOSIS — F411 Generalized anxiety disorder: Secondary | ICD-10-CM | POA: Diagnosis not present

## 2023-09-26 DIAGNOSIS — I10 Essential (primary) hypertension: Secondary | ICD-10-CM

## 2023-09-26 DIAGNOSIS — R42 Dizziness and giddiness: Secondary | ICD-10-CM | POA: Diagnosis not present

## 2023-09-26 DIAGNOSIS — R29818 Other symptoms and signs involving the nervous system: Secondary | ICD-10-CM | POA: Diagnosis not present

## 2023-09-26 DIAGNOSIS — M8580 Other specified disorders of bone density and structure, unspecified site: Secondary | ICD-10-CM

## 2023-09-26 MED ORDER — ESCITALOPRAM OXALATE 5 MG PO TABS
5.0000 mg | ORAL_TABLET | Freq: Every day | ORAL | 1 refills | Status: DC
Start: 2023-09-26 — End: 2023-10-31

## 2023-09-26 MED ORDER — VALSARTAN 320 MG PO TABS: 320.0000 mg | ORAL_TABLET | Freq: Every day | ORAL | 0 refills | Status: DC

## 2023-09-26 MED ORDER — DIAZEPAM 5 MG PO TABS: ORAL_TABLET | ORAL | 0 refills | Status: DC

## 2023-09-26 NOTE — Patient Instructions (Addendum)
 Hypertension:  Stop olmesartan . Start valsartan 320 mg daily. Continue chlorthalidone  50 mg daily.  Continue metoprolol  xl 50 mg daily.   Ordering ct scan of brain STAT.   Plan to order mri of brain. (Valium  to take for mri.)  Generalized Anxiety Disorder: start on lexapro 5 mg daily.

## 2023-09-26 NOTE — Progress Notes (Unsigned)
 Acute Office Visit  Subjective:    Patient ID: Kimberly Whitney, female    DOB: [birth year], 73 y.o.   MRN: [id number]  Chief Complaint  Patient presents with  . Dizziness  . Hypertension    HPI: Patient is in today for dizziness since 9 days ago. She described that everything was spinning and she had some nausea in the 3 first days. Then the dizziness got better but she still needs help to walk because her ambulation in unstable. Mini neuro exam done by her daughter, who is a PA, and her neuro exam was normal. Symptoms have improved, but feels off balance. No chest pain or shortness of breath. MRI done 07/13/2022. No evidence of acute intracranial abnormality.Mild chronic small vessel ischemic changes.   Patient brought BP log and systolic  146-170 and diastolic 69-85. She denies chest pain, SOB, headache.  Past Medical History:  Diagnosis Date  . Basal cell carcinoma (BCC) 09/2021   right forearm  . Cancer (HCC)   . Closed nondisplaced intertrochanteric fracture of left femur (HCC)   . Hypertension   . Intertrochanteric fracture of left hip (HCC) 01/29/2020  . Osteopenia   . Squamous cell cancer of skin of forearm, left   . Squamous cell carcinoma of leg, left     Past Surgical History:  Procedure Laterality Date  . BREAST EXCISIONAL BIOPSY Right    years ago  . CHOLECYSTECTOMY    . FRACTURE SURGERY     L hip 30 years ago  . INTRAMEDULLARY (IM) NAIL INTERTROCHANTERIC Left 01/23/2020   Procedure: INTRAMEDULLARY (IM) NAIL INTERTROCHANTRIC;  Surgeon: Wes Hamman, MD;  Location: [facility] OR;  Service: Orthopedics;  Laterality: Left;  . right cataract  05/2021    Family History  Problem Relation Age of Onset  . Transient ischemic attack Mother   . Dementia Mother   . Cancer Father   . Hypertension Father   . Osteoarthritis Sister   . Depression Sister   . Melanoma Brother   . Basal cell carcinoma Brother   . Squamous cell carcinoma Brother   . Breast cancer Maternal Aunt    . Breast cancer Maternal Aunt   . Breast cancer Paternal Aunt   . Pancreatic cancer Paternal Uncle   . Esophageal cancer Other     Social History   Socioeconomic History  . Marital status: Married    Spouse name: Not on file  . Number of children: 3  . Years of education: Not on file  . Highest education level: 12th grade  Occupational History  . Not on file  Tobacco Use  . Smoking status: Former    Current packs/day: 0.00    Types: Cigarettes    Quit date: 1980    Years since quitting: 45.4  . Smokeless tobacco: Never  Substance and Sexual Activity  . Alcohol use: Never  . Drug use: Never  . Sexual activity: Not on file  Other Topics Concern  . Not on file  Social History Narrative  . Not on file   Social Drivers of Health   Financial Resource Strain: Low Risk  (07/17/2023)   Overall Financial Resource Strain (CARDIA)   . Difficulty of Paying Living Expenses: Not hard at all  Food Insecurity: No Food Insecurity (07/17/2023)   Hunger Vital Sign   . Worried About Programme researcher, broadcasting/film/video in the Last Year: Never true   . Ran Out of Food in the Last Year: Never true  Transportation Needs: No Transportation  Needs (07/17/2023)   PRAPARE - Transportation   . Lack of Transportation (Medical): No   . Lack of Transportation (Non-Medical): No  Physical Activity: Sufficiently Active (07/17/2023)   Exercise Vital Sign   . Days of Exercise per Week: 4 days   . Minutes of Exercise per Session: 70 min  Stress: No Stress Concern Present (07/17/2023)   [employer] of Occupational Health - Occupational Stress Questionnaire   . Feeling of Stress : Not at all  Social Connections: Unknown (07/17/2023)   Social Connection and Isolation Panel [NHANES]   . Frequency of Communication with Friends and Family: More than three times a week   . Frequency of Social Gatherings with Friends and Family: More than three times a week   . Attends Religious Services: Patient declined   . Active  Member of Clubs or Organizations: Yes   . Attends Banker Meetings: Patient declined   . Marital Status: Married  Catering manager Violence: Not At Risk (01/31/2022)   Humiliation, Afraid, Rape, and Kick questionnaire   . Fear of Current or Ex-Partner: No   . Emotionally Abused: No   . Physically Abused: No   . Sexually Abused: No    Outpatient Medications Prior to Visit  Medication Sig Dispense Refill  . aspirin EC 81 MG tablet Take 81 mg by mouth daily.    . calcium-vitamin D (OSCAL WITH D) 500-200 MG-UNIT tablet Take 1 tablet by mouth 2 (two) times daily.    . cetirizine (ZYRTEC) 10 MG tablet Take 10 mg by mouth daily.    . chlorthalidone  (HYGROTON ) 50 MG tablet Take 1 tablet (50 mg total) by mouth daily. 90 tablet 1  . cholecalciferol (VITAMIN D3) 25 MCG (1000 UNIT) tablet Take 2,000 Units by mouth daily.    . fluticasone  (FLONASE ) 50 MCG/ACT nasal spray Place 2 sprays into both nostrils daily. 16 g 6  . MAGNESIUM  PO Take 1 each by mouth daily.    . metoprolol  succinate (TOPROL -XL) 50 MG 24 hr tablet Take 1 tablet (50 mg total) by mouth daily. Take with or immediately following a meal. 90 tablet 3  . nystatin cream (MYCOSTATIN) Apply 1 Application topically 2 (two) times daily as needed for dry skin.    . pantoprazole  (PROTONIX ) 40 MG tablet Take 1 tablet (40 mg total) by mouth 2 (two) times daily. 60 tablet 2  . pravastatin  (PRAVACHOL ) 10 MG tablet Take 1 tablet (10 mg total) by mouth daily. 90 tablet 1  . triamcinolone cream (KENALOG) 0.1 % Apply 1 application topically 2 (two) times daily.    . diazepam  (VALIUM ) 5 MG tablet Take 1 tablet (5 mg total) by mouth 2 (two) times daily as needed for anxiety. 20 tablet 0  . olmesartan  (BENICAR ) 40 MG tablet Take 1 tablet (40 mg total) by mouth daily. 90 tablet 1   No facility-administered medications prior to visit.    Allergies  Allergen Reactions  . Amlodipine Other (See Comments)    Ankle edema   . Lisinopril Cough   . Lidocaine  Rash    Lidocaine  patch    Review of Systems  Constitutional:  Negative for chills, fatigue and fever.  HENT:  Negative for congestion, ear pain and sore throat.   Respiratory:  Negative for cough and shortness of breath.   Cardiovascular:  Negative for chest pain and palpitations.  Gastrointestinal:  Positive for nausea. Negative for abdominal pain, constipation, diarrhea and vomiting.  Endocrine: Negative for polydipsia, polyphagia and polyuria.  Genitourinary:  Negative for difficulty urinating and dysuria.  Musculoskeletal:  Negative for arthralgias, back pain and myalgias.  Skin:  Negative for rash.  Neurological:  Positive for dizziness. Negative for headaches.  Psychiatric/Behavioral:  Negative for dysphoric mood. The patient is not nervous/anxious.        Objective:         09/26/2023    2:07 PM 09/26/2023    2:04 PM 09/26/2023    1:55 PM  Vitals with BMI  Height   5' 5.5"  Weight   187 lbs  BMI   30.63  Systolic 188 170 119  Diastolic 80 78 76  Pulse   61    No data found.   Physical Exam Vitals reviewed.  Constitutional:      Appearance: Normal appearance.  HENT:     Right Ear: Tympanic membrane, ear canal and external ear normal.     Left Ear: Tympanic membrane, ear canal and external ear normal.     Nose: Nose normal.     Comments: No sinus tenderness    Mouth/Throat:     Pharynx: Oropharynx is clear.  Neck:     Vascular: No carotid bruit.  Cardiovascular:     Rate and Rhythm: Normal rate and regular rhythm.     Heart sounds: Normal heart sounds. No murmur heard. Pulmonary:     Effort: Pulmonary effort is normal. No respiratory distress.     Breath sounds: Normal breath sounds.  Lymphadenopathy:     Cervical: No cervical adenopathy.  Neurological:     Mental Status: She is alert and oriented to person, place, and time.     Coordination: Romberg sign positive. Coordination abnormal. Finger-Nose-Finger Test normal.     Gait: Gait  abnormal.  Psychiatric:        Mood and Affect: Mood normal.        Behavior: Behavior normal.    Health Maintenance Due  Topic Date Due  . Hepatitis C Screening  Never done    There are no preventive care reminders to display for this patient.   Lab Results  Component Value Date   TSH 1.660 10/09/2020   Lab Results  Component Value Date   WBC 8.0 07/18/2023   HGB 14.5 07/18/2023   HCT 42.2 07/18/2023   MCV 91 07/18/2023   PLT 267 07/18/2023   Lab Results  Component Value Date   NA 137 07/18/2023   K 3.8 07/18/2023   CO2 25 07/18/2023   GLUCOSE 92 07/18/2023   BUN 17 07/18/2023   CREATININE 0.85 07/18/2023   BILITOT 0.7 07/18/2023   ALKPHOS 80 07/18/2023   AST 21 07/18/2023   ALT 23 07/18/2023   PROT 6.8 07/18/2023   ALBUMIN 4.4 07/18/2023   CALCIUM 9.7 07/18/2023   ANIONGAP 7 02/03/2020   EGFR 73 07/18/2023   Lab Results  Component Value Date   CHOL 181 07/18/2023   Lab Results  Component Value Date   HDL 49 07/18/2023   Lab Results  Component Value Date   LDLCALC 111 (H) 07/18/2023   Lab Results  Component Value Date   TRIG 116 07/18/2023   Lab Results  Component Value Date   CHOLHDL 3.7 07/18/2023   Lab Results  Component Value Date   HGBA1C 5.5 10/09/2020       Assessment & Plan:  Ataxia -     CT HEAD WO CONTRAST ( ); Future  Essential hypertension -     Valsartan; Take 1 tablet (320  mg total) by mouth daily.  Dispense: 90 tablet; Refill: 0  GAD (generalized anxiety disorder) -     Escitalopram Oxalate; Take 1 tablet (5 mg total) by mouth daily.  Dispense: 30 tablet; Refill: 1  Osteopenia after menopause  Other orders -     diazePAM ; One tablet one hour prior to mri. May repeat x 2 before/during exam. Must have a driver.  Dispense: 10 tablet; Refill: 0     Meds ordered this encounter  Medications  . valsartan (DIOVAN) 320 MG tablet    Sig: Take 1 tablet (320 mg total) by mouth daily.    Dispense:  90 tablet    Refill:   0  . escitalopram (LEXAPRO) 5 MG tablet    Sig: Take 1 tablet (5 mg total) by mouth daily.    Dispense:  30 tablet    Refill:  1  . diazepam  (VALIUM ) 5 MG tablet    Sig: One tablet one hour prior to mri. May repeat x 2 before/during exam. Must have a driver.    Dispense:  10 tablet    Refill:  0    Orders Placed This Encounter  Procedures  . CT HEAD WO CONTRAST ( )  . DG Outside Films Spine  . MR OUTSIDE FILMS HEAD/FACE     Follow-up: No follow-ups on file.  An After Visit Summary was printed and given to the patient.  Mercy Stall, MD [facility] [phone]

## 2023-09-29 NOTE — Assessment & Plan Note (Signed)
Start on lexapro 5mg  daily.

## 2023-09-29 NOTE — Assessment & Plan Note (Addendum)
 Order ct brain stat. Showed no strokes.  Order mri of brain. Valium  for mri.

## 2023-09-29 NOTE — Assessment & Plan Note (Signed)
 Uncontrolled. No changes to medicines. On Toprol  XL to half pill (50 mg) once a day.  Continue chlorthalidone  50 mg daily. Change losartan  to valsartan 320 mg daily. .  Check blood pressure daily.   Continue to work on eating a healthy diet and exercise.

## 2023-10-02 ENCOUNTER — Ambulatory Visit: Admitting: Family Medicine

## 2023-10-02 ENCOUNTER — Encounter: Payer: Self-pay | Admitting: Family Medicine

## 2023-10-02 VITALS — BP 144/60 | HR 98 | Temp 97.4°F | Resp 16 | Ht 65.5 in | Wt 187.0 lb

## 2023-10-02 DIAGNOSIS — R270 Ataxia, unspecified: Secondary | ICD-10-CM | POA: Diagnosis not present

## 2023-10-02 DIAGNOSIS — I10 Essential (primary) hypertension: Secondary | ICD-10-CM

## 2023-10-02 MED ORDER — ONDANSETRON HCL 4 MG PO TABS: 4.0000 mg | ORAL_TABLET | Freq: Three times a day (TID) | ORAL | 0 refills | Status: DC | PRN

## 2023-10-02 NOTE — Progress Notes (Unsigned)
 Subjective:  Patient ID: Kimberly Whitney, female    DOB: [birth year]  Age: 73 y.o. MRN: [id number]  Chief Complaint  Patient presents with   Hypertension   Dizziness    HPI: Patient was seen on 09/26/2023 for Dizziness and hypertension. She started valsartan  320 mg daily and her blood pressure at home systolic 117-156 and diastolic 60-69. She denies chest pain, SOB, headache.  She states that her dizziness is better, but she still has lightheaded.      09/26/2023    3:06 PM 07/20/2023   10:42 AM 02/02/2023   10:26 AM 01/31/2022    1:59 PM 11/15/2020    2:23 PM  Depression screen PHQ 2/9  Decreased Interest 0 0 0 0 0  Down, Depressed, Hopeless 0 0 0 0 0  PHQ - 2 Score 0 0 0 0 0  Altered sleeping 1  0 0   Tired, decreased energy 3  0 0   Change in appetite 1  0 0   Feeling bad or failure about yourself  0  0 0   Trouble concentrating 0  0 0   Moving slowly or fidgety/restless 0  0 0   Suicidal thoughts 0  0 0   PHQ-9 Score 5  0 0   Difficult doing work/chores Somewhat difficult  Not difficult at all Not difficult at all         09/26/2023    3:06 PM  Fall Risk   Falls in the past year? 0  Number falls in past yr: 0  Injury with Fall? 0  Risk for fall due to : No Fall Risks  Follow up Falls evaluation completed    Patient Care Team: Mercy Stall, MD as PCP - General (Family Medicine) Wes Hamman, MD as Attending Physician (Orthopedic Surgery)   Review of Systems  Constitutional:  Negative for chills, fatigue and fever.  HENT:  Negative for congestion, ear pain and sore throat.   Respiratory:  Negative for cough and shortness of breath.   Cardiovascular:  Negative for chest pain and palpitations.  Gastrointestinal:  Negative for abdominal pain, constipation, diarrhea, nausea and vomiting.  Endocrine: Negative for polydipsia, polyphagia and polyuria.  Genitourinary:  Negative for difficulty urinating and dysuria.  Musculoskeletal:  Negative for arthralgias, back pain and  myalgias.  Skin:  Negative for rash.  Neurological:  Positive for light-headedness. Negative for headaches.  Psychiatric/Behavioral:  Negative for dysphoric mood. The patient is not nervous/anxious.     Current Outpatient Medications on File Prior to Visit  Medication Sig Dispense Refill   aspirin EC 81 MG tablet Take 81 mg by mouth daily.     calcium-vitamin D (OSCAL WITH D) 500-200 MG-UNIT tablet Take 1 tablet by mouth 2 (two) times daily.     cetirizine (ZYRTEC) 10 MG tablet Take 10 mg by mouth daily.     chlorthalidone  (HYGROTON ) 50 MG tablet Take 1 tablet (50 mg total) by mouth daily. 90 tablet 1   cholecalciferol (VITAMIN D3) 25 MCG (1000 UNIT) tablet Take 2,000 Units by mouth daily.     diazepam  (VALIUM ) 5 MG tablet One tablet one hour prior to mri. May repeat x 2 before/during exam. Must have a driver. 10 tablet 0   escitalopram  (LEXAPRO ) 5 MG tablet Take 1 tablet (5 mg total) by mouth daily. 30 tablet 1   fluticasone  (FLONASE ) 50 MCG/ACT nasal spray Place 2 sprays into both nostrils daily. 16 g 6   MAGNESIUM  PO Take 1 each  by mouth daily.     metoprolol  succinate (TOPROL -XL) 50 MG 24 hr tablet Take 1 tablet (50 mg total) by mouth daily. Take with or immediately following a meal. 90 tablet 3   nystatin cream (MYCOSTATIN) Apply 1 Application topically 2 (two) times daily as needed for dry skin.     pantoprazole  (PROTONIX ) 40 MG tablet Take 1 tablet (40 mg total) by mouth 2 (two) times daily. 60 tablet 2   pravastatin  (PRAVACHOL ) 10 MG tablet Take 1 tablet (10 mg total) by mouth daily. 90 tablet 1   triamcinolone cream (KENALOG) 0.1 % Apply 1 application topically 2 (two) times daily.     valsartan  (DIOVAN ) 320 MG tablet Take 1 tablet (320 mg total) by mouth daily. 90 tablet 0   No current facility-administered medications on file prior to visit.   Past Medical History:  Diagnosis Date   Basal cell carcinoma (BCC) 09/2021   right forearm   Cancer (HCC)    Closed nondisplaced  intertrochanteric fracture of left femur (HCC)    Hypertension    Intertrochanteric fracture of left hip (HCC) 01/29/2020   Osteopenia    Squamous cell cancer of skin of forearm, left    Squamous cell carcinoma of leg, left    Past Surgical History:  Procedure Laterality Date   BREAST EXCISIONAL BIOPSY Right    years ago   CHOLECYSTECTOMY     FRACTURE SURGERY     L hip 30 years ago   INTRAMEDULLARY (IM) NAIL INTERTROCHANTERIC Left 01/23/2020   Procedure: INTRAMEDULLARY (IM) NAIL INTERTROCHANTRIC;  Surgeon: Wes Hamman, MD;  Location: [facility] OR;  Service: Orthopedics;  Laterality: Left;   right cataract  05/2021    Family History  Problem Relation Age of Onset   Transient ischemic attack Mother    Dementia Mother    Cancer Father    Hypertension Father    Osteoarthritis Sister    Depression Sister    Melanoma Brother    Basal cell carcinoma Brother    Squamous cell carcinoma Brother    Breast cancer Maternal Aunt    Breast cancer Maternal Aunt    Breast cancer Paternal Aunt    Pancreatic cancer Paternal Uncle    Esophageal cancer Other    Social History   Socioeconomic History   Marital status: Married    Spouse name: Not on file   Number of children: 3   Years of education: Not on file   Highest education level: 12th grade  Occupational History   Not on file  Tobacco Use   Smoking status: Former    Current packs/day: 0.00    Types: Cigarettes    Quit date: 1980    Years since quitting: 45.4   Smokeless tobacco: Never  Substance and Sexual Activity   Alcohol use: Never   Drug use: Never   Sexual activity: Not on file  Other Topics Concern   Not on file  Social History Narrative   Not on file   Social Drivers of Health   Financial Resource Strain: Low Risk  (07/17/2023)   Overall Financial Resource Strain (CARDIA)    Difficulty of Paying Living Expenses: Not hard at all  Food Insecurity: No Food Insecurity (07/17/2023)   Hunger Vital Sign    Worried About  Running Out of Food in the Last Year: Never true    Ran Out of Food in the Last Year: Never true  Transportation Needs: No Transportation Needs (07/17/2023)   PRAPARE - Transportation  Lack of Transportation (Medical): No    Lack of Transportation (Non-Medical): No  Physical Activity: Sufficiently Active (07/17/2023)   Exercise Vital Sign    Days of Exercise per Week: 4 days    Minutes of Exercise per Session: 70 min  Stress: No Stress Concern Present (07/17/2023)   [employer] of Occupational Health - Occupational Stress Questionnaire    Feeling of Stress : Not at all  Social Connections: Unknown (07/17/2023)   Social Connection and Isolation Panel [NHANES]    Frequency of Communication with Friends and Family: More than three times a week    Frequency of Social Gatherings with Friends and Family: More than three times a week    Attends Religious Services: Patient declined    Database administrator or Organizations: Yes    Attends Engineer, structural: Patient declined    Marital Status: Married    Objective:  BP (!) 144/60   Pulse 98   Temp (!) 97.4 F (36.3 C)   Resp 16   Ht 5' 5.5" (1.664 m)   Wt 187 lb (84.8 kg)   SpO2 (!) 60%   BMI 30.65 kg/m      10/02/2023    2:48 PM 09/26/2023    2:07 PM 09/26/2023    2:04 PM  BP/Weight  Systolic BP 144 188 170  Diastolic BP 60 80 78  Wt. (Lbs) 187    BMI 30.65 kg/m2      Physical Exam Vitals reviewed.  Constitutional:      Appearance: Normal appearance. She is normal weight. She is not diaphoretic.  Neck:     Vascular: No carotid bruit.  Cardiovascular:     Rate and Rhythm: Normal rate and regular rhythm.     Heart sounds: Normal heart sounds.  Pulmonary:     Effort: Pulmonary effort is normal. No respiratory distress.     Breath sounds: Normal breath sounds.  Neurological:     Mental Status: She is alert and oriented to person, place, and time.     Coordination: Romberg sign positive.  Psychiatric:         Mood and Affect: Mood normal.        Behavior: Behavior normal.     Diabetic Foot Exam - Simple   No data filed      Lab Results  Component Value Date   WBC 8.0 07/18/2023   HGB 14.5 07/18/2023   HCT 42.2 07/18/2023   PLT 267 07/18/2023   GLUCOSE 92 07/18/2023   CHOL 181 07/18/2023   TRIG 116 07/18/2023   HDL 49 07/18/2023   LDLCALC 111 (H) 07/18/2023   ALT 23 07/18/2023   AST 21 07/18/2023   NA 137 07/18/2023   K 3.8 07/18/2023   CL 97 07/18/2023   CREATININE 0.85 07/18/2023   BUN 17 07/18/2023   CO2 25 07/18/2023   TSH 1.660 10/09/2020   INR 1.0 01/23/2020   HGBA1C 5.5 10/09/2020      Assessment & Plan:  Ataxia  Essential hypertension  Other orders -     Ondansetron  HCl; Take 1 tablet (4 mg total) by mouth every 8 (eight) hours as needed for nausea or vomiting.  Dispense: 20 tablet; Refill: 0     Meds ordered this encounter  Medications   ondansetron  (ZOFRAN ) 4 MG tablet    Sig: Take 1 tablet (4 mg total) by mouth every 8 (eight) hours as needed for nausea or vomiting.    Dispense:  20 tablet  Refill:  0    No orders of the defined types were placed in this encounter.    Follow-up: Return in about 4 weeks (around 10/30/2023) for chronic follow up.   I,Marla I Leal-Borjas,acting as a scribe for Mercy Stall, MD.,have documented all relevant documentation on the behalf of Mercy Stall, MD,as directed by  Mercy Stall, MD while in the presence of Mercy Stall, MD.   An After Visit Summary was printed and given to the patient.  Mercy Stall, MD [facility] [phone]

## 2023-10-03 NOTE — Telephone Encounter (Signed)
 Pt is scheduled at [facility] on 6/6 arriving at 1:30pm. Pt notified.

## 2023-10-05 NOTE — Assessment & Plan Note (Signed)
 Trying to move mri of brain up. Previously given Valium  for mri.

## 2023-10-05 NOTE — Assessment & Plan Note (Addendum)
 Uncontrolled. No changes to medicines. On Toprol  XL to half pill (50 mg) once a day.  Continue chlorthalidone  50 mg daily. Continue valsartan  320 mg daily. Recently changed. Improved, but not at goal.  Check blood pressure daily.   Continue to work on eating a healthy diet and exercise.

## 2023-10-06 ENCOUNTER — Ambulatory Visit (HOSPITAL_COMMUNITY)
Admission: RE | Admit: 2023-10-06 | Discharge: 2023-10-06 | Disposition: A | Discharge status: Home / Self Care | Source: Ambulatory Visit | Attending: Family Medicine | Admitting: Family Medicine

## 2023-10-06 DIAGNOSIS — I6782 Cerebral ischemia: Secondary | ICD-10-CM | POA: Diagnosis not present

## 2023-10-06 DIAGNOSIS — R270 Ataxia, unspecified: Secondary | ICD-10-CM | POA: Insufficient documentation

## 2023-10-06 MED ORDER — GADOBUTROL 1 MMOL/ML IV SOLN
9.0000 mL | Freq: Once | INTRAVENOUS | Status: AC | PRN
  Administered 2023-10-06: 9 mL via INTRAVENOUS

## 2023-10-09 ENCOUNTER — Ambulatory Visit: Payer: Self-pay | Admitting: Family Medicine

## 2023-10-19 ENCOUNTER — Other Ambulatory Visit (HOSPITAL_BASED_OUTPATIENT_CLINIC_OR_DEPARTMENT_OTHER): Admitting: Radiology

## 2023-10-31 ENCOUNTER — Ambulatory Visit: Admitting: Family Medicine

## 2023-10-31 ENCOUNTER — Encounter: Payer: Self-pay | Admitting: Family Medicine

## 2023-10-31 VITALS — BP 130/68 | HR 67 | Temp 98.1°F | Ht 65.5 in | Wt 187.0 lb

## 2023-10-31 DIAGNOSIS — I10 Essential (primary) hypertension: Secondary | ICD-10-CM | POA: Diagnosis not present

## 2023-10-31 DIAGNOSIS — F411 Generalized anxiety disorder: Secondary | ICD-10-CM | POA: Diagnosis not present

## 2023-10-31 DIAGNOSIS — K219 Gastro-esophageal reflux disease without esophagitis: Secondary | ICD-10-CM | POA: Diagnosis not present

## 2023-10-31 DIAGNOSIS — H9311 Tinnitus, right ear: Secondary | ICD-10-CM

## 2023-10-31 MED ORDER — VALSARTAN 320 MG PO TABS: 320.0000 mg | ORAL_TABLET | Freq: Every day | ORAL | 1 refills | Status: AC

## 2023-10-31 MED ORDER — VOQUEZNA 10 MG PO TABS: 10.0000 mg | ORAL_TABLET | Freq: Every day | ORAL | Status: DC

## 2023-10-31 MED ORDER — ESCITALOPRAM OXALATE 10 MG PO TABS: 10.0000 mg | ORAL_TABLET | Freq: Every day | ORAL | 1 refills | Status: DC

## 2023-10-31 MED ORDER — HYDRALAZINE HCL 10 MG PO TABS: 10.0000 mg | ORAL_TABLET | Freq: Three times a day (TID) | ORAL | 1 refills | Status: DC

## 2023-10-31 NOTE — Patient Instructions (Addendum)
 For anxiety: Increase Lexapro  to 10 mg daily.  Hypertension recommending add hydralazine 10 mg 3 times daily.  Continue valsartan  320 mg once daily, chlorthalidone  50 mg daily, and Toprol -XL 50 mg half pill daily.  Reflux: Stop pantoprazole  and famotidine .  Start Voquezna 10 mg daily.  Recommend start on bioflavonoid OTC for ringing in your ears.   Consider ENT referral.

## 2023-10-31 NOTE — Progress Notes (Unsigned)
 Subjective:  Patient ID: Kimberly Whitney, female    DOB: 26-Mar-1951  Age: 72 y.o. MRN: [id number]  Chief Complaint  Patient presents with   Medical Management of Chronic Issues    HPI:  Hypertension: Current medications: Decreased to toprol  xl 50 mg daily, chlorthalidone  50 mg daily, valsartan  320mg  daily. At home readings have been 117-162/60-77.   Allergies: loratidine 10 mg daily in am and zyrtec in the afternoon. Still having allergy symptoms.    GERD: hoarseness and reflux has resolved on protonix  40 mg daily. Continue famotidine  40 mg daily.         09/26/2023    3:06 PM 07/20/2023   10:42 AM 02/02/2023   10:26 AM 01/31/2022    1:59 PM 11/15/2020    2:23 PM  Depression screen PHQ 2/9  Decreased Interest 0 0 0 0 0  Down, Depressed, Hopeless 0 0 0 0 0  PHQ - 2 Score 0 0 0 0 0  Altered sleeping 1  0 0   Tired, decreased energy 3  0 0   Change in appetite 1  0 0   Feeling bad or failure about yourself  0  0 0   Trouble concentrating 0  0 0   Moving slowly or fidgety/restless 0  0 0   Suicidal thoughts 0  0 0   PHQ-9 Score 5  0 0   Difficult doing work/chores Somewhat difficult  Not difficult at all Not difficult at all         09/26/2023    3:06 PM  Fall Risk   Falls in the past year? 0  Number falls in past yr: 0  Injury with Fall? 0  Risk for fall due to : No Fall Risks  Follow up Falls evaluation completed      09/26/2023    3:07 PM 02/02/2023   10:26 AM 03/02/2021    2:47 PM  GAD 7 : Generalized Anxiety Score  Nervous, Anxious, on Edge 2 0 3  Control/stop worrying 2 0 3  Worry too much - different things 1 0 2  Trouble relaxing 0 0 2  Restless 0 0 0  Easily annoyed or irritable 2 0 1  Afraid - awful might happen 3 0 2  Total GAD 7 Score 10 0 13  Anxiety Difficulty Somewhat difficult Not difficult at all      Patient Care Team: Sherre Clapper, MD as PCP - General (Family Medicine) Jerri Kay HERO, MD as Attending Physician (Orthopedic Surgery)   Review  of Systems  Constitutional:  Negative for chills, fatigue and fever.  HENT:  Negative for congestion, ear pain, rhinorrhea and sore throat.   Respiratory:  Negative for cough and shortness of breath.   Cardiovascular:  Negative for chest pain.  Gastrointestinal:  Negative for abdominal pain, constipation, diarrhea, nausea and vomiting.  Genitourinary:  Negative for dysuria and urgency.  Musculoskeletal:  Negative for back pain and myalgias.  Neurological:  Negative for dizziness, weakness, light-headedness and headaches.  Psychiatric/Behavioral:  Negative for dysphoric mood. The patient is not nervous/anxious.     Current Outpatient Medications on File Prior to Visit  Medication Sig Dispense Refill   aspirin EC 81 MG tablet Take 81 mg by mouth daily.     calcium-vitamin D (OSCAL WITH D) 500-200 MG-UNIT tablet Take 1 tablet by mouth 2 (two) times daily.     cetirizine (ZYRTEC) 10 MG tablet Take 10 mg by mouth daily.     chlorthalidone  (HYGROTON )  50 MG tablet Take 1 tablet (50 mg total) by mouth daily. 90 tablet 1   cholecalciferol (VITAMIN D3) 25 MCG (1000 UNIT) tablet Take 2,000 Units by mouth daily.     diazepam  (VALIUM ) 5 MG tablet One tablet one hour prior to mri. May repeat x 2 before/during exam. Must have a driver. 10 tablet 0   escitalopram  (LEXAPRO ) 5 MG tablet Take 1 tablet (5 mg total) by mouth daily. 30 tablet 1   fluticasone  (FLONASE ) 50 MCG/ACT nasal spray Place 2 sprays into both nostrils daily. 16 g 6   MAGNESIUM  PO Take 1 each by mouth daily.     metoprolol  succinate (TOPROL -XL) 50 MG 24 hr tablet Take 1 tablet (50 mg total) by mouth daily. Take with or immediately following a meal. 90 tablet 3   nystatin cream (MYCOSTATIN) Apply 1 Application topically 2 (two) times daily as needed for dry skin.     ondansetron  (ZOFRAN ) 4 MG tablet Take 1 tablet (4 mg total) by mouth every 8 (eight) hours as needed for nausea or vomiting. 20 tablet 0   pantoprazole  (PROTONIX ) 40 MG tablet  Take 1 tablet (40 mg total) by mouth 2 (two) times daily. 60 tablet 2   pravastatin  (PRAVACHOL ) 10 MG tablet Take 1 tablet (10 mg total) by mouth daily. 90 tablet 1   triamcinolone cream (KENALOG) 0.1 % Apply 1 application topically 2 (two) times daily.     valsartan  (DIOVAN ) 320 MG tablet Take 1 tablet (320 mg total) by mouth daily. 90 tablet 0   No current facility-administered medications on file prior to visit.   Past Medical History:  Diagnosis Date   Basal cell carcinoma (BCC) 09/2021   right forearm   Cancer (HCC)    Closed nondisplaced intertrochanteric fracture of left femur (HCC)    Hypertension    Intertrochanteric fracture of left hip (HCC) 01/29/2020   Osteopenia    Squamous cell cancer of skin of forearm, left    Squamous cell carcinoma of leg, left    Past Surgical History:  Procedure Laterality Date   BREAST EXCISIONAL BIOPSY Right    years ago   CHOLECYSTECTOMY     FRACTURE SURGERY     L hip 30 years ago   INTRAMEDULLARY (IM) NAIL INTERTROCHANTERIC Left 01/23/2020   Procedure: INTRAMEDULLARY (IM) NAIL INTERTROCHANTRIC;  Surgeon: Jerri Kay HERO, MD;  Location: [facility] OR;  Service: Orthopedics;  Laterality: Left;   right cataract  05/2021    Family History  Problem Relation Age of Onset   Transient ischemic attack Mother    Dementia Mother    Cancer Father    Hypertension Father    Osteoarthritis Sister    Depression Sister    Melanoma Brother    Basal cell carcinoma Brother    Squamous cell carcinoma Brother    Breast cancer Maternal Aunt    Breast cancer Maternal Aunt    Breast cancer Paternal Aunt    Pancreatic cancer Paternal Uncle    Esophageal cancer Other    Social History   Socioeconomic History   Marital status: Married    Spouse name: Not on file   Number of children: 3   Years of education: Not on file   Highest education level: 12th grade  Occupational History   Not on file  Tobacco Use   Smoking status: Former    Current packs/day:  0.00    Types: Cigarettes    Quit date: 1980    Years since quitting:  45.5   Smokeless tobacco: Never  Substance and Sexual Activity   Alcohol use: Never   Drug use: Never   Sexual activity: Not on file  Other Topics Concern   Not on file  Social History Narrative   Not on file   Social Drivers of Health   Financial Resource Strain: Low Risk  (10/29/2023)   Overall Financial Resource Strain (CARDIA)    Difficulty of Paying Living Expenses: Not hard at all  Food Insecurity: No Food Insecurity (10/29/2023)   Hunger Vital Sign    Worried About Running Out of Food in the Last Year: Never true    Ran Out of Food in the Last Year: Never true  Transportation Needs: No Transportation Needs (10/29/2023)   PRAPARE - Administrator, Civil Service (Medical): No    Lack of Transportation (Non-Medical): No  Physical Activity: Sufficiently Active (10/29/2023)   Exercise Vital Sign    Days of Exercise per Week: 3 days    Minutes of Exercise per Session: 70 min  Stress: No Stress Concern Present (10/29/2023)   [employer] of Occupational Health - Occupational Stress Questionnaire    Feeling of Stress: Not at all  Social Connections: Moderately Integrated (10/29/2023)   Social Connection and Isolation Panel    Frequency of Communication with Friends and Family: More than three times a week    Frequency of Social Gatherings with Friends and Family: Twice a week    Attends Religious Services: Patient declined    Database administrator or Organizations: Yes    Attends Engineer, structural: More than 4 times per year    Marital Status: Married    Objective:  BP 130/68   Pulse 67   Temp 98.1 F (36.7 C)   Ht 5' 5.5 (1.664 m)   Wt 187 lb (84.8 kg)   SpO2 97%   BMI 30.65 kg/m      10/31/2023    2:32 PM 10/02/2023    2:48 PM 09/26/2023    2:07 PM  BP/Weight  Systolic BP 130 144 188  Diastolic BP 68 60 80  Wt. (Lbs) 187 187   BMI 30.65 kg/m2 30.65 kg/m2      Physical Exam  {Perform Simple Foot Exam  Perform Detailed exam:1} {Insert foot Exam (Optional):30965}   Lab Results  Component Value Date   WBC 8.0 07/18/2023   HGB 14.5 07/18/2023   HCT 42.2 07/18/2023   PLT 267 07/18/2023   GLUCOSE 92 07/18/2023   CHOL 181 07/18/2023   TRIG 116 07/18/2023   HDL 49 07/18/2023   LDLCALC 111 (H) 07/18/2023   ALT 23 07/18/2023   AST 21 07/18/2023   NA 137 07/18/2023   K 3.8 07/18/2023   CL 97 07/18/2023   CREATININE 0.85 07/18/2023   BUN 17 07/18/2023   CO2 25 07/18/2023   TSH 1.660 10/09/2020   INR 1.0 01/23/2020   HGBA1C 5.5 10/09/2020      Assessment & Plan:  GAD (generalized anxiety disorder)  Essential hypertension     No orders of the defined types were placed in this encounter.   No orders of the defined types were placed in this encounter.    Follow-up: No follow-ups on file.   I,Katherina A Bramblett,acting as a scribe for Abigail Free, MD.,have documented all relevant documentation on the behalf of Abigail Free, MD,as directed by  Abigail Free, MD while in the presence of Abigail Free, MD.   An After Visit  Summary was printed and given to the patient.  Abigail Free, MD [facility] [phone]

## 2023-11-02 DIAGNOSIS — H9311 Tinnitus, right ear: Secondary | ICD-10-CM | POA: Insufficient documentation

## 2023-11-02 NOTE — Assessment & Plan Note (Signed)
 Stop pantoprazole  and famotidine .  Start Voquezna 10 mg daily. If no improvement, I would recommend referral to GI.

## 2023-11-02 NOTE — Assessment & Plan Note (Signed)
Increase Lexapro to 10 mg daily 

## 2023-11-02 NOTE — Assessment & Plan Note (Signed)
 Recommend start on bioflavonoid OTC for ringing in your ears.   Consider ENT referral.

## 2023-11-02 NOTE — Assessment & Plan Note (Signed)
 recommending add hydralazine 10 mg 3 times daily.  Continue valsartan  320 mg once daily, chlorthalidone  50 mg daily, and Toprol -XL 50 mg half pill daily.

## 2023-11-05 ENCOUNTER — Telehealth: Payer: Self-pay

## 2023-11-05 NOTE — Telephone Encounter (Signed)
 Hello, Can you check on this? please Copied from CRM [id number]. Topic: General - Billing Inquiry >> Nov 02, 2023  4:31 PM Tobias L wrote: Reason for CRM: Patient calling about MRI and authorization for the MRI. Patient states she received a notice of denial of payment from [facility] about the MRI DOS: 10/06/2023.   Patient requesting call back to discuss further: [phone]

## 2023-11-09 ENCOUNTER — Other Ambulatory Visit: Payer: Self-pay | Admitting: Family Medicine

## 2023-11-09 DIAGNOSIS — K219 Gastro-esophageal reflux disease without esophagitis: Secondary | ICD-10-CM

## 2023-11-22 DIAGNOSIS — L820 Inflamed seborrheic keratosis: Secondary | ICD-10-CM | POA: Diagnosis not present

## 2023-11-22 DIAGNOSIS — D485 Neoplasm of uncertain behavior of skin: Secondary | ICD-10-CM | POA: Diagnosis not present

## 2023-11-22 DIAGNOSIS — L814 Other melanin hyperpigmentation: Secondary | ICD-10-CM | POA: Diagnosis not present

## 2023-11-22 DIAGNOSIS — D225 Melanocytic nevi of trunk: Secondary | ICD-10-CM | POA: Diagnosis not present

## 2023-11-22 DIAGNOSIS — L821 Other seborrheic keratosis: Secondary | ICD-10-CM | POA: Diagnosis not present

## 2023-11-22 DIAGNOSIS — D2239 Melanocytic nevi of other parts of face: Secondary | ICD-10-CM | POA: Diagnosis not present

## 2023-11-23 ENCOUNTER — Other Ambulatory Visit: Payer: Self-pay | Admitting: Family Medicine

## 2023-11-23 DIAGNOSIS — K219 Gastro-esophageal reflux disease without esophagitis: Secondary | ICD-10-CM

## 2023-11-28 ENCOUNTER — Ambulatory Visit (INDEPENDENT_AMBULATORY_CARE_PROVIDER_SITE_OTHER): Admitting: Family Medicine

## 2023-11-28 ENCOUNTER — Encounter: Payer: Self-pay | Admitting: Family Medicine

## 2023-11-28 VITALS — BP 128/66 | HR 67 | Temp 97.9°F | Resp 16 | Ht 65.5 in | Wt 184.4 lb

## 2023-11-28 DIAGNOSIS — I10 Essential (primary) hypertension: Secondary | ICD-10-CM | POA: Diagnosis not present

## 2023-11-28 DIAGNOSIS — F411 Generalized anxiety disorder: Secondary | ICD-10-CM

## 2023-11-28 DIAGNOSIS — K219 Gastro-esophageal reflux disease without esophagitis: Secondary | ICD-10-CM | POA: Diagnosis not present

## 2023-11-28 MED ORDER — VOQUEZNA 10 MG PO TABS: 10.0000 mg | ORAL_TABLET | Freq: Every day | ORAL | 1 refills | Status: DC

## 2023-11-28 MED ORDER — VOQUEZNA 10 MG PO TABS: 10.0000 mg | ORAL_TABLET | Freq: Every day | ORAL | 2 refills | Status: DC

## 2023-11-28 NOTE — Addendum Note (Signed)
 Addended by: TERESSA HARRIE HERO on: 11/28/2023 01:53 PM   Modules accepted: Orders

## 2023-11-28 NOTE — Patient Instructions (Addendum)
 VISIT SUMMARY: Today, we reviewed your current medications and discussed your ongoing health concerns, including hypertension, depression, and allergies. We also talked about your recent experience with vertigo and tinnitus, which have resolved.  YOUR PLAN: -HYPERTENSION: Hypertension, or high blood pressure, is being managed with your current medications. Please continue taking hydralazine  10 mg three times daily, valsartan  320 mg daily, chlorthalidone  50 mg daily, and metoprolol  25 mg daily. We will follow up in four weeks to check your blood pressure.  -DEPRESSION: Depression is being treated with Lexapro . Since increasing the dose to 10 mg four weeks ago has not significantly improved your symptoms, you may consider reducing the dose back to 5 mg if you do not notice any improvement.  -ALLERGIES: Your allergy symptoms are well-managed with Zyrtec or Claritin . Please continue taking either of these medications as needed.  INSTRUCTIONS: Please follow up with Kimberly Whitney in four weeks for a blood pressure check.                                   Contains text generated by [employer].

## 2023-11-28 NOTE — Assessment & Plan Note (Signed)
 Well controlled    11/28/2023   10:37 AM 09/26/2023    3:07 PM 02/02/2023   10:26 AM 03/02/2021    2:47 PM  GAD 7 : Generalized Anxiety Score  Nervous, Anxious, on Edge 0 2 0 3  Control/stop worrying 0 2 0 3  Worry too much - different things 0 1 0 2  Trouble relaxing 0 0 0 2  Restless 0 0 0 0  Easily annoyed or irritable 0 2 0 1  Afraid - awful might happen 0 3 0 2  Total GAD 7 Score 0 10 0 13  Anxiety Difficulty Not difficult at all Somewhat difficult Not difficult at all      Increased Lexapro  to 10 mg four weeks ago with no significant symptom change. - Continue Lexapro  10 mg daily. - Consider reducing to 5 mg if no improvement.

## 2023-11-28 NOTE — Assessment & Plan Note (Signed)
 Well controlled on current medication - Continue Voquezna  10 mg daily. - Send Rx for this medication to [facility] - Send to [facility] if too costly

## 2023-11-28 NOTE — Assessment & Plan Note (Signed)
 Hypertension is managed with current medication. BP Readings from Last 3 Encounters:  11/28/23 128/66  10/31/23 130/68  10/02/23 (!) 144/60    - Continue hydralazine  10 mg TID, valsartan  320 mg, chlorthalidone  50 mg daily, metoprolol  25 mg daily. - Follow up with Doctor Cox in four weeks for BP check. - FU with Dr. Sherre on 01/02/24

## 2023-11-28 NOTE — Progress Notes (Signed)
 Subjective:  Patient ID: Kimberly Whitney, female    DOB: October 31, 1950  Age: 73 y.o. MRN: [id number]  Chief Complaint  Patient presents with   Medical Management of Chronic Issues    Discussed the use of AI scribe software for clinical note transcription with the patient, who gave verbal consent to proceed.  History of Present Illness   Kimberly Whitney is a 73 year old female with hypertension and GERD who presents for medication management and follow-up.  GERD: She has been on Voquezna  10 mg for four weeks and is happy that it is working better than her previous reflux medication. She continues to avoid sodas and spicy foods. She has been using samples so far and is requesting a refill for this medication.   Hypertension: Her current regimen includes valsartan  320 mg, hydralazine  10 mg three times daily, chlorthalidone  50 mg daily, and Toprol  25 mg daily. Hydralazine  was added four weeks ago. She reports no side effects with the recent change in medications. Denies chest pain, shortness of breath or dizziness.     Tinnitus: She experienced vertigo six weeks ago, which resolved four weeks ago. Following the vertigo, she had tinnitus, which has since resolved without treatment. She did not end up getting the bioflavonoid OTC supplement.  Anxiety: She increased her Lexapro  to 10 mg four weeks ago but has not noticed a significant difference in her anxiety levels. She is considering returning to a 5 mg dose. She states that she hasn't had any life stressors recently to determine if it has helped more than the 5 mg dose.        11/28/2023   10:36 AM 09/26/2023    3:06 PM 07/20/2023   10:42 AM 02/02/2023   10:26 AM 01/31/2022    1:59 PM  Depression screen PHQ 2/9  Decreased Interest 0 0 0 0 0  Down, Depressed, Hopeless 0 0 0 0 0  PHQ - 2 Score 0 0 0 0 0  Altered sleeping 0 1  0 0  Tired, decreased energy 0 3  0 0  Change in appetite 0 1  0 0  Feeling bad or failure about yourself  0 0  0 0   Trouble concentrating 0 0  0 0  Moving slowly or fidgety/restless 0 0  0 0  Suicidal thoughts 0 0  0 0  PHQ-9 Score 0 5  0 0  Difficult doing work/chores Not difficult at all Somewhat difficult  Not difficult at all Not difficult at all        11/28/2023   10:36 AM  Fall Risk   Falls in the past year? 0  Number falls in past yr: 0  Injury with Fall? 0  Risk for fall due to : No Fall Risks  Follow up Falls evaluation completed    Patient Care Team: Sherre Clapper, MD as PCP - General (Family Medicine) Jerri Kay HERO, MD as Attending Physician (Orthopedic Surgery)   Review of Systems  Constitutional:  Negative for chills, diaphoresis, fatigue and fever.  HENT:  Negative for congestion, ear pain and sinus pain.   Eyes: Negative.   Respiratory:  Negative for cough and shortness of breath.   Cardiovascular:  Negative for chest pain.  Gastrointestinal:  Negative for abdominal pain, constipation, diarrhea, nausea and vomiting.  Endocrine: Negative.   Genitourinary:  Negative for dysuria, frequency and urgency.  Musculoskeletal:  Negative for arthralgias.  Skin: Negative.   Allergic/Immunologic: Negative.   Neurological:  Negative for dizziness,  weakness, light-headedness and headaches.  Hematological: Negative.   Psychiatric/Behavioral:  Negative for dysphoric mood. The patient is not nervous/anxious.     Current Outpatient Medications on File Prior to Visit  Medication Sig Dispense Refill   aspirin EC 81 MG tablet Take 81 mg by mouth daily.     calcium-vitamin D (OSCAL WITH D) 500-200 MG-UNIT tablet Take 1 tablet by mouth 2 (two) times daily.     cetirizine (ZYRTEC) 10 MG tablet Take 10 mg by mouth daily.     chlorthalidone  (HYGROTON ) 50 MG tablet Take 1 tablet (50 mg total) by mouth daily. 90 tablet 1   cholecalciferol (VITAMIN D3) 25 MCG (1000 UNIT) tablet Take 2,000 Units by mouth daily.     escitalopram  (LEXAPRO ) 10 MG tablet Take 1 tablet (10 mg total) by mouth daily. 30  tablet 1   fluticasone  (FLONASE ) 50 MCG/ACT nasal spray Place 2 sprays into both nostrils daily. 16 g 6   hydrALAZINE  (APRESOLINE ) 10 MG tablet Take 1 tablet (10 mg total) by mouth 3 (three) times daily. 90 tablet 1   MAGNESIUM  PO Take 1 each by mouth daily.     metoprolol  succinate (TOPROL -XL) 50 MG 24 hr tablet Take 1 tablet (50 mg total) by mouth daily. Take with or immediately following a meal. 90 tablet 3   nystatin cream (MYCOSTATIN) Apply 1 Application topically 2 (two) times daily as needed for dry skin.     ondansetron  (ZOFRAN ) 4 MG tablet Take 1 tablet (4 mg total) by mouth every 8 (eight) hours as needed for nausea or vomiting. 20 tablet 0   pravastatin  (PRAVACHOL ) 10 MG tablet Take 1 tablet (10 mg total) by mouth daily. 90 tablet 1   triamcinolone cream (KENALOG) 0.1 % Apply 1 application topically 2 (two) times daily.     valsartan  (DIOVAN ) 320 MG tablet Take 1 tablet (320 mg total) by mouth daily. 90 tablet 1   diazepam  (VALIUM ) 5 MG tablet One tablet one hour prior to mri. May repeat x 2 before/during exam. Must have a driver. (Patient not taking: Reported on 11/28/2023) 10 tablet 0   No current facility-administered medications on file prior to visit.   Past Medical History:  Diagnosis Date   Basal cell carcinoma (BCC) 09/2021   right forearm   Cancer (HCC)    Closed nondisplaced intertrochanteric fracture of left femur (HCC)    Hypertension    Intertrochanteric fracture of left hip (HCC) 01/29/2020   Osteopenia    Squamous cell cancer of skin of forearm, left    Squamous cell carcinoma of leg, left    Past Surgical History:  Procedure Laterality Date   BREAST EXCISIONAL BIOPSY Right    years ago   CHOLECYSTECTOMY     FRACTURE SURGERY     L hip 30 years ago   INTRAMEDULLARY (IM) NAIL INTERTROCHANTERIC Left 01/23/2020   Procedure: INTRAMEDULLARY (IM) NAIL INTERTROCHANTRIC;  Surgeon: Jerri Kay HERO, MD;  Location: [facility] OR;  Service: Orthopedics;  Laterality: Left;   right  cataract  05/2021    Family History  Problem Relation Age of Onset   Transient ischemic attack Mother    Dementia Mother    Cancer Father    Hypertension Father    Osteoarthritis Sister    Depression Sister    Melanoma Brother    Basal cell carcinoma Brother    Squamous cell carcinoma Brother    Breast cancer Maternal Aunt    Breast cancer Maternal Aunt    Breast  cancer Paternal Aunt    Pancreatic cancer Paternal Uncle    Esophageal cancer Other    Social History   Socioeconomic History   Marital status: Married    Spouse name: Not on file   Number of children: 3   Years of education: Not on file   Highest education level: 12th grade  Occupational History   Not on file  Tobacco Use   Smoking status: Former    Current packs/day: 0.00    Types: Cigarettes    Quit date: 1980    Years since quitting: 45.6   Smokeless tobacco: Never  Substance and Sexual Activity   Alcohol use: Never   Drug use: Never   Sexual activity: Not on file  Other Topics Concern   Not on file  Social History Narrative   Not on file   Social Drivers of Health   Financial Resource Strain: Low Risk  (10/29/2023)   Overall Financial Resource Strain (CARDIA)    Difficulty of Paying Living Expenses: Not hard at all  Food Insecurity: No Food Insecurity (10/29/2023)   Hunger Vital Sign    Worried About Running Out of Food in the Last Year: Never true    Ran Out of Food in the Last Year: Never true  Transportation Needs: No Transportation Needs (10/29/2023)   PRAPARE - Administrator, Civil Service (Medical): No    Lack of Transportation (Non-Medical): No  Physical Activity: Sufficiently Active (10/29/2023)   Exercise Vital Sign    Days of Exercise per Week: 3 days    Minutes of Exercise per Session: 70 min  Stress: No Stress Concern Present (10/29/2023)   [employer] of Occupational Health - Occupational Stress Questionnaire    Feeling of Stress: Not at all  Social Connections:  Moderately Integrated (10/29/2023)   Social Connection and Isolation Panel    Frequency of Communication with Friends and Family: More than three times a week    Frequency of Social Gatherings with Friends and Family: Twice a week    Attends Religious Services: Patient declined    Database administrator or Organizations: Yes    Attends Engineer, structural: More than 4 times per year    Marital Status: Married    Objective:  BP 128/66   Pulse 67   Temp 97.9 F (36.6 C) (Temporal)   Resp 16   Ht 5' 5.5 (1.664 m)   Wt 184 lb 6.4 oz (83.6 kg)   SpO2 95%   BMI 30.22 kg/m      11/28/2023   10:27 AM 10/31/2023    2:32 PM 10/02/2023    2:48 PM  BP/Weight  Systolic BP 128 130 144  Diastolic BP 66 68 60  Wt. (Lbs) 184.4 187 187  BMI 30.22 kg/m2 30.65 kg/m2 30.65 kg/m2    Physical Exam Vitals reviewed.  Constitutional:      General: She is not in acute distress.    Appearance: Normal appearance.  Eyes:     Conjunctiva/sclera: Conjunctivae normal.  Cardiovascular:     Rate and Rhythm: Normal rate and regular rhythm.     Heart sounds: Normal heart sounds. No murmur heard. Pulmonary:     Effort: Pulmonary effort is normal.     Breath sounds: Normal breath sounds. No wheezing.  Musculoskeletal:        General: Normal range of motion.  Skin:    General: Skin is warm.  Neurological:     Mental Status: She is  alert. Mental status is at baseline.  Psychiatric:        Mood and Affect: Mood normal.        Behavior: Behavior normal.     Lab Results  Component Value Date   WBC 8.0 07/18/2023   HGB 14.5 07/18/2023   HCT 42.2 07/18/2023   PLT 267 07/18/2023   GLUCOSE 92 07/18/2023   CHOL 181 07/18/2023   TRIG 116 07/18/2023   HDL 49 07/18/2023   LDLCALC 111 (H) 07/18/2023   ALT 23 07/18/2023   AST 21 07/18/2023   NA 137 07/18/2023   K 3.8 07/18/2023   CL 97 07/18/2023   CREATININE 0.85 07/18/2023   BUN 17 07/18/2023   CO2 25 07/18/2023   TSH 1.660 10/09/2020    INR 1.0 01/23/2020   HGBA1C 5.5 10/09/2020      Assessment & Plan:  Essential hypertension Assessment & Plan: Hypertension is managed with current medication. BP Readings from Last 3 Encounters:  11/28/23 128/66  10/31/23 130/68  10/02/23 (!) 144/60    - Continue hydralazine  10 mg TID, valsartan  320 mg, chlorthalidone  50 mg daily, metoprolol  25 mg daily. - Follow up with Doctor [facility] in four weeks for BP check. - FU with Dr. Sherre on 01/02/24   Gastroesophageal reflux disease without esophagitis Assessment & Plan: Well controlled on current medication - Continue Voquezna  10 mg daily. - Send Rx for this medication to [facility] - Send to [facility] if too costly  Orders: -     Voquezna ; Take 10 mg by mouth daily.  Dispense: 30 tablet; Refill: 2  GAD (generalized anxiety disorder) Assessment & Plan: Well controlled    11/28/2023   10:37 AM 09/26/2023    3:07 PM 02/02/2023   10:26 AM 03/02/2021    2:47 PM  GAD 7 : Generalized Anxiety Score  Nervous, Anxious, on Edge 0 2 0 3  Control/stop worrying 0 2 0 3  Worry too much - different things 0 1 0 2  Trouble relaxing 0 0 0 2  Restless 0 0 0 0  Easily annoyed or irritable 0 2 0 1  Afraid - awful might happen 0 3 0 2  Total GAD 7 Score 0 10 0 13  Anxiety Difficulty Not difficult at all Somewhat difficult Not difficult at all      Increased Lexapro  to 10 mg four weeks ago with no significant symptom change. - Continue Lexapro  10 mg daily. - Consider reducing to 5 mg if no improvement.      Meds ordered this encounter  Medications   Vonoprazan Fumarate  (VOQUEZNA ) 10 MG TABS    Sig: Take 10 mg by mouth daily.    Dispense:  30 tablet    Refill:  2    Follow-up: Return if symptoms worsen or fail to improve.   I,Angela Taylor,acting as a Neurosurgeon for Harrie CHRISTELLA Cedar, FNP.,have documented all relevant documentation on the behalf of Harrie CHRISTELLA Cedar, FNP,as directed by  Harrie CHRISTELLA Cedar, FNP while in the presence of Harrie CHRISTELLA Cedar,  FNP.   An After Visit Summary was printed and given to the patient.  Harrie Cedar, FNP [facility] [phone]

## 2023-12-28 ENCOUNTER — Other Ambulatory Visit: Payer: Self-pay | Admitting: Family Medicine

## 2023-12-28 DIAGNOSIS — F411 Generalized anxiety disorder: Secondary | ICD-10-CM

## 2024-01-02 ENCOUNTER — Other Ambulatory Visit: Payer: Self-pay | Admitting: Family Medicine

## 2024-01-02 ENCOUNTER — Encounter: Payer: Self-pay | Admitting: Family Medicine

## 2024-01-02 ENCOUNTER — Ambulatory Visit (INDEPENDENT_AMBULATORY_CARE_PROVIDER_SITE_OTHER): Admitting: Family Medicine

## 2024-01-02 VITALS — BP 136/68 | HR 60 | Temp 98.2°F | Ht 65.5 in | Wt 185.0 lb

## 2024-01-02 DIAGNOSIS — I10 Essential (primary) hypertension: Secondary | ICD-10-CM

## 2024-01-02 DIAGNOSIS — K219 Gastro-esophageal reflux disease without esophagitis: Secondary | ICD-10-CM

## 2024-01-02 DIAGNOSIS — F411 Generalized anxiety disorder: Secondary | ICD-10-CM

## 2024-01-02 DIAGNOSIS — E782 Mixed hyperlipidemia: Secondary | ICD-10-CM | POA: Diagnosis not present

## 2024-01-02 MED ORDER — ESCITALOPRAM OXALATE 5 MG PO TABS: 5.0000 mg | ORAL_TABLET | Freq: Every day | ORAL | 1 refills | Status: DC

## 2024-01-02 MED ORDER — HYDRALAZINE HCL 25 MG PO TABS: 25.0000 mg | ORAL_TABLET | Freq: Three times a day (TID) | ORAL | 2 refills | Status: DC

## 2024-01-02 NOTE — Patient Instructions (Signed)
  VISIT SUMMARY: Today, we discussed your ongoing issues with blood pressure, acid reflux, and anxiety. We made some adjustments to your medications and planned further evaluations to better manage your conditions.  YOUR PLAN: PRIMARY HYPERTENSION: Your blood pressure remains high despite taking four medications. -We will increase your hydralazine  dose to 25 mg three times a day. Continue other bp medicines.  -A new prescription for hydralazine  has been sent to your pharmacy, and the old one has been canceled. -We will conduct a blood test and a renal artery ultrasound to check for secondary causes of your high blood pressure. -Please use the new blood pressure monitoring sheet to track your readings.  GASTROESOPHAGEAL REFLUX DISEASE (GERD): You continue to have acid reflux symptoms even with your current medication. -We are increasing your Voquezna  dose to 20 mg daily. Samples have been provided. -You are being referred to [facility] in [place] for further evaluation.  GENERALIZED ANXIETY DISORDER: Your anxiety is well-managed with Lexapro  5 mg. -We will ensure your Lexapro  5 mg prescription is available for refill.                      Contains text generated by [employer].                                 Contains text generated by [employer].

## 2024-01-02 NOTE — Progress Notes (Signed)
 Subjective:  Patient ID: Kimberly Whitney, female    DOB: 12-08-1950  Age: 73 y.o. MRN: [id number]  Chief Complaint  Patient presents with   Medical Management of Chronic Issues    Could not tell a difference in lexapro  10mg  so wants to go back to 5mg  dose    HPI: Discussed the use of AI scribe software for clinical note transcription with the patient, who gave verbal consent to proceed.  History of Present Illness Kimberly Whitney is a 73 year old female with hypertension and acid reflux who presents for follow-up of her blood pressure and reflux symptoms.  Gastroesophageal reflux symptoms - Persistent acid reflux symptoms occurring three to four times daily - Symptoms are exacerbated by leaning forward or exercising - Improvement in frequency and severity of symptoms since initiation of Voquezna , but not resolved.  - Previously experienced constant reflux symptoms prior to Voquezna  - Currently taking Voquezna  10 mg daily - No abdominal pain, but discomfort attributed to reflux  Hypertension management - Long-standing hypertension with poor control over the past couple of years - Blood pressure readings have ranged from a low of 108/64 mmHg to a high of 158/66 mmHg - Currently taking four antihypertensive medications: chlorthalidone  50 mg once daily, hydralazine  10 mg three times daily, metoprolol  XL 50 mg once daily, and valsartan  320 mg once daily - Blood pressure remains uncontrolled with occasional significant elevations - No chest pain  Anxiety symptoms - Currently taking Lexapro  for anxiety - Initially increased dose to 10 mg, but did not experience additional benefit - Returned to 5 mg dose, which is fairly effective for anxiety symptoms       11/28/2023   10:36 AM 09/26/2023    3:06 PM 07/20/2023   10:42 AM 02/02/2023   10:26 AM 01/31/2022    1:59 PM  Depression screen PHQ 2/9  Decreased Interest 0 0 0 0 0  Down, Depressed, Hopeless 0 0 0 0 0  PHQ - 2 Score 0 0 0 0 0   Altered sleeping 0 1  0 0  Tired, decreased energy 0 3  0 0  Change in appetite 0 1  0 0  Feeling bad or failure about yourself  0 0  0 0  Trouble concentrating 0 0  0 0  Moving slowly or fidgety/restless 0 0  0 0  Suicidal thoughts 0 0  0 0  PHQ-9 Score 0 5  0 0  Difficult doing work/chores Not difficult at all Somewhat difficult  Not difficult at all Not difficult at all        11/28/2023   10:36 AM  Fall Risk   Falls in the past year? 0  Number falls in past yr: 0  Injury with Fall? 0  Risk for fall due to : No Fall Risks  Follow up Falls evaluation completed    Patient Care Team: Sherre Clapper, MD as PCP - General (Family Medicine) Jerri Kay HERO, MD as Attending Physician (Orthopedic Surgery)   Review of Systems  Constitutional:  Negative for chills, fatigue and fever.  HENT:  Negative for congestion, ear pain and sore throat.   Respiratory:  Negative for cough and shortness of breath.   Cardiovascular:  Negative for chest pain.  Gastrointestinal:  Negative for abdominal pain, constipation, diarrhea, nausea and vomiting.       Gerd  Genitourinary:  Negative for dysuria and urgency.  Musculoskeletal:  Negative for arthralgias and myalgias.  Skin:  Negative for rash.  Neurological:  Negative for dizziness and headaches.  Psychiatric/Behavioral:  Negative for dysphoric mood. The patient is not nervous/anxious.     Current Outpatient Medications on File Prior to Visit  Medication Sig Dispense Refill   aspirin EC 81 MG tablet Take 81 mg by mouth daily.     calcium-vitamin D (OSCAL WITH D) 500-200 MG-UNIT tablet Take 1 tablet by mouth 2 (two) times daily.     cetirizine (ZYRTEC) 10 MG tablet Take 10 mg by mouth daily.     chlorthalidone  (HYGROTON ) 50 MG tablet Take 1 tablet (50 mg total) by mouth daily. 90 tablet 1   cholecalciferol (VITAMIN D3) 25 MCG (1000 UNIT) tablet Take 2,000 Units by mouth daily.     diazepam  (VALIUM ) 5 MG tablet One tablet one hour prior to mri.  May repeat x 2 before/during exam. Must have a driver. 10 tablet 0   fluticasone  (FLONASE ) 50 MCG/ACT nasal spray Place 2 sprays into both nostrils daily. 16 g 6   MAGNESIUM  PO Take 1 each by mouth daily.     metoprolol  succinate (TOPROL -XL) 50 MG 24 hr tablet Take 1 tablet (50 mg total) by mouth daily. Take with or immediately following a meal. 90 tablet 3   nystatin cream (MYCOSTATIN) Apply 1 Application topically 2 (two) times daily as needed for dry skin.     ondansetron  (ZOFRAN ) 4 MG tablet Take 1 tablet (4 mg total) by mouth every 8 (eight) hours as needed for nausea or vomiting. 20 tablet 0   pravastatin  (PRAVACHOL ) 10 MG tablet Take 1 tablet (10 mg total) by mouth daily. 90 tablet 1   triamcinolone cream (KENALOG) 0.1 % Apply 1 application topically 2 (two) times daily.     valsartan  (DIOVAN ) 320 MG tablet Take 1 tablet (320 mg total) by mouth daily. 90 tablet 1   Vonoprazan Fumarate  (VOQUEZNA ) 10 MG TABS Take 10 mg by mouth daily. 30 tablet 1   No current facility-administered medications on file prior to visit.   Past Medical History:  Diagnosis Date   Basal cell carcinoma (BCC) 09/2021   right forearm   Cancer (HCC)    Closed nondisplaced intertrochanteric fracture of left femur (HCC)    Hypertension    Intertrochanteric fracture of left hip (HCC) 01/29/2020   Osteopenia    Squamous cell cancer of skin of forearm, left    Squamous cell carcinoma of leg, left    Past Surgical History:  Procedure Laterality Date   BREAST EXCISIONAL BIOPSY Right    years ago   CHOLECYSTECTOMY     FRACTURE SURGERY     L hip 30 years ago   INTRAMEDULLARY (IM) NAIL INTERTROCHANTERIC Left 01/23/2020   Procedure: INTRAMEDULLARY (IM) NAIL INTERTROCHANTRIC;  Surgeon: Jerri Kay HERO, MD;  Location: [facility] OR;  Service: Orthopedics;  Laterality: Left;   right cataract  05/2021    Family History  Problem Relation Age of Onset   Transient ischemic attack Mother    Dementia Mother    Cancer Father     Hypertension Father    Osteoarthritis Sister    Depression Sister    Melanoma Brother    Basal cell carcinoma Brother    Squamous cell carcinoma Brother    Breast cancer Maternal Aunt    Breast cancer Maternal Aunt    Breast cancer Paternal Aunt    Pancreatic cancer Paternal Uncle    Esophageal cancer Other    Social History   Socioeconomic History   Marital status: Married  Spouse name: Not on file   Number of children: 3   Years of education: Not on file   Highest education level: 12th grade  Occupational History   Not on file  Tobacco Use   Smoking status: Former    Current packs/day: 0.00    Types: Cigarettes    Quit date: 1980    Years since quitting: 45.7   Smokeless tobacco: Never  Substance and Sexual Activity   Alcohol use: Never   Drug use: Never   Sexual activity: Not on file  Other Topics Concern   Not on file  Social History Narrative   Not on file   Social Drivers of Health   Financial Resource Strain: Low Risk  (10/29/2023)   Overall Financial Resource Strain (CARDIA)    Difficulty of Paying Living Expenses: Not hard at all  Food Insecurity: No Food Insecurity (10/29/2023)   Hunger Vital Sign    Worried About Running Out of Food in the Last Year: Never true    Ran Out of Food in the Last Year: Never true  Transportation Needs: No Transportation Needs (10/29/2023)   PRAPARE - Administrator, Civil Service (Medical): No    Lack of Transportation (Non-Medical): No  Physical Activity: Sufficiently Active (10/29/2023)   Exercise Vital Sign    Days of Exercise per Week: 3 days    Minutes of Exercise per Session: 70 min  Stress: No Stress Concern Present (10/29/2023)   [employer] of Occupational Health - Occupational Stress Questionnaire    Feeling of Stress: Not at all  Social Connections: Moderately Integrated (10/29/2023)   Social Connection and Isolation Panel    Frequency of Communication with Friends and Family: More than three  times a week    Frequency of Social Gatherings with Friends and Family: Twice a week    Attends Religious Services: Patient declined    Database administrator or Organizations: Yes    Attends Engineer, structural: More than 4 times per year    Marital Status: Married    Objective:  BP 136/68   Pulse 60   Temp 98.2 F (36.8 C)   Ht 5' 5.5 (1.664 m)   Wt 185 lb (83.9 kg)   SpO2 98%   BMI 30.32 kg/m      01/02/2024    2:53 PM 11/28/2023   10:27 AM 10/31/2023    2:32 PM  BP/Weight  Systolic BP 136 128 130  Diastolic BP 68 66 68  Wt. (Lbs) 185 184.4 187  BMI 30.32 kg/m2 30.22 kg/m2 30.65 kg/m2    Physical Exam Vitals reviewed.  Constitutional:      Appearance: Normal appearance. She is normal weight.  Cardiovascular:     Rate and Rhythm: Normal rate and regular rhythm.     Heart sounds: Normal heart sounds.  Pulmonary:     Effort: Pulmonary effort is normal. No respiratory distress.     Breath sounds: Normal breath sounds.  Abdominal:     General: Abdomen is flat. Bowel sounds are normal.     Palpations: Abdomen is soft.     Tenderness: There is abdominal tenderness (mild epigastric.).  Neurological:     Mental Status: She is alert and oriented to person, place, and time.  Psychiatric:        Mood and Affect: Mood normal.        Behavior: Behavior normal.         Lab Results  Component Value Date  WBC 8.0 07/18/2023   HGB 14.5 07/18/2023   HCT 42.2 07/18/2023   PLT 267 07/18/2023   GLUCOSE 92 07/18/2023   CHOL 181 07/18/2023   TRIG 116 07/18/2023   HDL 49 07/18/2023   LDLCALC 111 (H) 07/18/2023   ALT 23 07/18/2023   AST 21 07/18/2023   NA 137 07/18/2023   K 3.8 07/18/2023   CL 97 07/18/2023   CREATININE 0.85 07/18/2023   BUN 17 07/18/2023   CO2 25 07/18/2023   TSH 1.760 01/02/2024   INR 1.0 01/23/2020   HGBA1C 5.5 10/09/2020      Assessment & Plan:  Essential hypertension Assessment & Plan: Hypertension uncontrolled despite four  medications. Considering secondary causes like renal artery stenosis. - Order blood test for secondary hypertension evaluation. - Order renal artery ultrasound. - Increase hydralazine  to 25 mg three times a day. Continue chlorthalidone  50 mg once daily, metoprolol  XL 50 mg once daily, and valsartan  320 mg once daily - Send new hydralazine  prescription to pharmacy and cancel previous prescription. - Provide new blood pressure monitoring sheet.  Orders: -     US  RENAL ARTERY DUPLEX COMPLETE; Future -     hydrALAZINE  HCl; Take 1 tablet (25 mg total) by mouth 3 (three) times daily.  Dispense: 90 tablet; Refill: 2 -     Metanephrines, plasma  GAD (generalized anxiety disorder) Assessment & Plan: Lexapro  10 mg ineffective, reverting to 5 mg which was effective. - Ensure Lexapro  5 mg prescription is available for refill.  Orders: -     Escitalopram  Oxalate; Take 1 tablet (5 mg total) by mouth daily.  Dispense: 90 tablet; Refill: 1  Gastroesophageal reflux disease without esophagitis Assessment & Plan: Persistent GERD symptoms despite Voquezna .  - Provide samples of Voquezna  20 mg. - Instruct to double the dose of Voquezna  10 mg to 2 daily to make 20 mg dose. - Refer to [facility] in [place] for further evaluation.  Orders: -     Ambulatory referral to Gastroenterology  Mixed hyperlipidemia Assessment & Plan: Well controlled.  No changes to medicines. Continue pravastatin  10 mg before bed. Continue to work on eating a healthy diet and exercise.    Orders: -     TSH    Meds ordered this encounter  Medications   escitalopram  (LEXAPRO ) 5 MG tablet    Sig: Take 1 tablet (5 mg total) by mouth daily.    Dispense:  90 tablet    Refill:  1    This prescription was filled on 12/08/2023. Any refills authorized will be placed on file.   hydrALAZINE  (APRESOLINE ) 25 MG tablet    Sig: Take 1 tablet (25 mg total) by mouth 3 (three) times daily.    Dispense:  90 tablet     Refill:  2    CANCEL HYDRALAZINE  10 MG THREE TIMES A DAY.    Orders Placed This Encounter  Procedures   US  Renal Artery Stenosis   TSH   Metanephrines, plasma   Ambulatory referral to Gastroenterology     Follow-up: Return in about 7 weeks (around 02/22/2024) for chronic follow up (already scheduled).  I,Marla I Leal-Borjas,acting as a scribe for Abigail Free, MD.,have documented all relevant documentation on the behalf of Abigail Free, MD,as directed by  Abigail Free, MD while in the presence of Abigail Free, MD.    An After Visit Summary was printed and given to the patient.  I attest that I have reviewed this visit and agree with the plan  scribed by my staff.   Abigail Free, MD [facility] [phone]

## 2024-01-03 ENCOUNTER — Ambulatory Visit: Payer: Self-pay | Admitting: Family Medicine

## 2024-01-03 LAB — TSH: TSH: 1.76 u[IU]/mL (ref 0.450–4.500)

## 2024-01-05 ENCOUNTER — Encounter: Payer: Self-pay | Admitting: Family Medicine

## 2024-01-05 ENCOUNTER — Encounter: Payer: Self-pay | Admitting: Physician Assistant

## 2024-01-05 ENCOUNTER — Ambulatory Visit (INDEPENDENT_AMBULATORY_CARE_PROVIDER_SITE_OTHER)
Admission: RE | Admit: 2024-01-05 | Discharge: 2024-01-05 | Disposition: A | Discharge status: Home / Self Care | Source: Ambulatory Visit | Attending: Family Medicine | Admitting: Family Medicine

## 2024-01-05 DIAGNOSIS — I10 Essential (primary) hypertension: Secondary | ICD-10-CM | POA: Diagnosis not present

## 2024-01-06 NOTE — Assessment & Plan Note (Signed)
 Lexapro  10 mg ineffective, reverting to 5 mg which was effective. - Ensure Lexapro  5 mg prescription is available for refill.

## 2024-01-06 NOTE — Assessment & Plan Note (Signed)
Well controlled.  No changes to medicines. Continue pravastatin 10 mg before bed. Continue to work on eating a healthy diet and exercise.

## 2024-01-06 NOTE — Assessment & Plan Note (Signed)
 Hypertension uncontrolled despite four medications. Considering secondary causes like renal artery stenosis. - Order blood test for secondary hypertension evaluation. - Order renal artery ultrasound. - Increase hydralazine  to 25 mg three times a day. - Send new hydralazine  prescription to pharmacy and cancel previous prescription. - Provide new blood pressure monitoring sheet.

## 2024-01-06 NOTE — Assessment & Plan Note (Signed)
 Persistent GERD symptoms despite Voquezna . Considering dose increase and gastroenterology referral. - Provide samples of Voquezna  20 mg. - Instruct to double the dose of Voquezna  to 20 mg. - Refer to [facility] in [place] for further evaluation.

## 2024-01-07 LAB — METANEPHRINES, PLASMA
Metanephrine, Free: <25 pg/mL (ref 0.0–88.0)
Normetanephrine, Free: 70.2 pg/mL (ref 0.0–285.2)

## 2024-01-08 ENCOUNTER — Other Ambulatory Visit: Payer: Self-pay | Admitting: Family Medicine

## 2024-01-08 MED ORDER — SUCRALFATE 1 G PO TABS: 1.0000 g | ORAL_TABLET | Freq: Three times a day (TID) | ORAL | 1 refills | Status: DC

## 2024-01-15 ENCOUNTER — Other Ambulatory Visit: Payer: Self-pay | Admitting: Family Medicine

## 2024-01-15 DIAGNOSIS — I10 Essential (primary) hypertension: Secondary | ICD-10-CM

## 2024-01-15 DIAGNOSIS — I701 Atherosclerosis of renal artery: Secondary | ICD-10-CM

## 2024-01-17 ENCOUNTER — Encounter: Payer: Self-pay | Admitting: Family Medicine

## 2024-01-17 ENCOUNTER — Other Ambulatory Visit: Payer: Self-pay | Admitting: Family Medicine

## 2024-01-17 DIAGNOSIS — I10 Essential (primary) hypertension: Secondary | ICD-10-CM

## 2024-01-17 DIAGNOSIS — I701 Atherosclerosis of renal artery: Secondary | ICD-10-CM

## 2024-01-22 ENCOUNTER — Encounter: Payer: Self-pay | Admitting: Family Medicine

## 2024-01-22 ENCOUNTER — Other Ambulatory Visit: Payer: Self-pay

## 2024-01-22 ENCOUNTER — Other Ambulatory Visit: Payer: Self-pay | Admitting: Family Medicine

## 2024-01-22 DIAGNOSIS — K219 Gastro-esophageal reflux disease without esophagitis: Secondary | ICD-10-CM

## 2024-01-22 MED ORDER — VOQUEZNA 20 MG PO TABS: 20.0000 mg | ORAL_TABLET | Freq: Every day | ORAL | 3 refills | Status: DC

## 2024-01-24 ENCOUNTER — Ambulatory Visit
Admission: RE | Admit: 2024-01-24 | Discharge: 2024-01-24 | Disposition: A | Discharge status: Home / Self Care | Source: Ambulatory Visit | Attending: Family Medicine | Admitting: Family Medicine

## 2024-01-24 ENCOUNTER — Encounter: Payer: Self-pay | Admitting: Radiology

## 2024-01-24 DIAGNOSIS — I10 Essential (primary) hypertension: Secondary | ICD-10-CM

## 2024-01-24 DIAGNOSIS — I700 Atherosclerosis of aorta: Secondary | ICD-10-CM | POA: Diagnosis not present

## 2024-01-24 DIAGNOSIS — I701 Atherosclerosis of renal artery: Secondary | ICD-10-CM

## 2024-01-24 MED ORDER — IOPAMIDOL (ISOVUE-370) INJECTION 76%
100.0000 mL | Freq: Once | INTRAVENOUS | Status: AC | PRN
  Administered 2024-01-24: 100 mL via INTRAVENOUS

## 2024-01-24 NOTE — Consult Note (Signed)
 Chief Complaint: Hypertension with potential renal artery stenosis.  Referring Provider(s): Cox,Kirsten   Patient Status:  History of Present Illness: Kimberly Whitney is a 73 y.o. female With poorly controlled hypertension. Ultrasound renal arterial duplex was performed on 01/13/2024 which demonstrated peak arterial flow in the right renal artery to be suggestive of significant stenosis. A follow-up exam CT angio abdomen and pelvis with and without contrast was performed on 01/24/2024 and reviewed by the demonstrating of significant renal artery stenosis and no findings suggestive of fibromuscular dysplasia. Renal arteries are widely patent with minimal calcification the ostium bilaterally. No evidence to suggest renal artery disease as the source of the patient's hypertension. We discussed renal artery stenosis and reviewed the imaging of her renal arteries during the office visit order to explain anatomy and possible sources of hypertension. The patient's systolic blood pressure today was 170 demonstrating poor control multiple medications. Patient may be a candidate for renal artery denervation, however I do not perform that procedure. Possibly looking locally for a practitioner that could provide this minimally invasive procedure could be beneficial to the patient as it seems that over the last several years her blood pressure has been poorly controlled (previously well controlled few medications).     Past Medical History:  Diagnosis Date   Basal cell carcinoma (BCC) 09/2021   right forearm   Cancer (HCC)    Closed nondisplaced intertrochanteric fracture of left femur (HCC)    Hypertension    Intertrochanteric fracture of left hip (HCC) 01/29/2020   Osteopenia    Squamous cell cancer of skin of forearm, left    Squamous cell carcinoma of leg, left     Past Surgical History:  Procedure Laterality Date   BREAST EXCISIONAL BIOPSY Right    years ago   CHOLECYSTECTOMY     FRACTURE  SURGERY     L hip 30 years ago   INTRAMEDULLARY (IM) NAIL INTERTROCHANTERIC Left 01/23/2020   Procedure: INTRAMEDULLARY (IM) NAIL INTERTROCHANTRIC;  Surgeon: Jerri Kay HERO, MD;  Location: [facility] OR;  Service: Orthopedics;  Laterality: Left;   right cataract  05/2021    Allergies: Amlodipine, Lisinopril, and Lidocaine   Medications: Prior to Admission medications   Medication Sig Start Date End Date Taking? Authorizing Provider  aspirin EC 81 MG tablet Take 81 mg by mouth daily. 07/05/19   [provider]  calcium-vitamin D (OSCAL WITH D) 500-200 MG-UNIT tablet Take 1 tablet by mouth 2 (two) times daily.    [provider]  cetirizine (ZYRTEC) 10 MG tablet Take 10 mg by mouth daily. 07/19/16   [provider]  chlorthalidone  (HYGROTON ) 50 MG tablet Take 1 tablet (50 mg total) by mouth daily. 08/18/23   Cox, Abigail, MD  cholecalciferol (VITAMIN D3) 25 MCG (1000 UNIT) tablet Take 2,000 Units by mouth daily.    [provider]  diazepam  (VALIUM ) 5 MG tablet One tablet one hour prior to mri. May repeat x 2 before/during exam. Must have a driver. 09/26/23   CoxAbigail, MD  escitalopram  (LEXAPRO ) 5 MG tablet Take 1 tablet (5 mg total) by mouth daily. 01/02/24   CoxAbigail, MD  fluticasone  (FLONASE ) 50 MCG/ACT nasal spray Place 2 sprays into both nostrils daily. 08/18/23   CoxAbigail, MD  hydrALAZINE  (APRESOLINE ) 25 MG tablet Take 1 tablet (25 mg total) by mouth 3 (three) times daily. 01/02/24   Sherre Abigail, MD  MAGNESIUM  PO Take 1 each by mouth daily. 07/15/15   [provider]  metoprolol   succinate (TOPROL -XL) 50 MG 24 hr tablet Take 1 tablet (50 mg total) by mouth daily. Take with or immediately following a meal. 08/18/23   Cox, Kirsten, MD  nystatin cream (MYCOSTATIN) Apply 1 Application topically 2 (two) times daily as needed for dry skin. 04/19/23   [provider]  ondansetron  (ZOFRAN ) 4 MG tablet Take 1 tablet (4 mg total) by mouth every 8 (eight)  hours as needed for nausea or vomiting. 10/02/23   CoxAbigail, MD  pravastatin  (PRAVACHOL ) 10 MG tablet Take 1 tablet (10 mg total) by mouth daily. 08/18/23   CoxAbigail, MD  sucralfate  (CARAFATE ) 1 g tablet Take 1 tablet (1 g total) by mouth 4 (four) times daily -  with meals and at bedtime. 01/08/24   Cox, Abigail, MD  triamcinolone cream (KENALOG) 0.1 % Apply 1 application topically 2 (two) times daily.    [provider]  valsartan  (DIOVAN ) 320 MG tablet Take 1 tablet (320 mg total) by mouth daily. 10/31/23   Cox, Abigail, MD  Vonoprazan Fumarate  (VOQUEZNA ) 20 MG TABS Take 20 mg by mouth daily. 01/22/24   CoxAbigail, MD     Family History  Problem Relation Age of Onset   Transient ischemic attack Mother    Dementia Mother    Cancer Father    Hypertension Father    Osteoarthritis Sister    Depression Sister    Melanoma Brother    Basal cell carcinoma Brother    Squamous cell carcinoma Brother    Breast cancer Maternal Aunt    Breast cancer Maternal Aunt    Breast cancer Paternal Aunt    Pancreatic cancer Paternal Uncle    Esophageal cancer Other     Social History   Socioeconomic History   Marital status: Married    Spouse name: Not on file   Number of children: 3   Years of education: Not on file   Highest education level: 12th grade  Occupational History   Not on file  Tobacco Use   Smoking status: Former    Current packs/day: 0.00    Types: Cigarettes    Quit date: 1980    Years since quitting: 45.7   Smokeless tobacco: Never  Substance and Sexual Activity   Alcohol use: Never   Drug use: Never   Sexual activity: Not on file  Other Topics Concern   Not on file  Social History Narrative   Not on file   Social Drivers of Health   Financial Resource Strain: Low Risk  (10/29/2023)   Overall Financial Resource Strain (CARDIA)    Difficulty of Paying Living Expenses: Not hard at all  Food Insecurity: No Food Insecurity (10/29/2023)   Hunger Vital Sign     Worried About Running Out of Food in the Last Year: Never true    Ran Out of Food in the Last Year: Never true  Transportation Needs: No Transportation Needs (10/29/2023)   PRAPARE - Administrator, Civil Service (Medical): No    Lack of Transportation (Non-Medical): No  Physical Activity: Sufficiently Active (10/29/2023)   Exercise Vital Sign    Days of Exercise per Week: 3 days    Minutes of Exercise per Session: 70 min  Stress: No Stress Concern Present (10/29/2023)   [employer] of Occupational Health - Occupational Stress Questionnaire    Feeling of Stress: Not at all  Social Connections: Moderately Integrated (10/29/2023)   Social Connection and Isolation Panel    Frequency of Communication  with Friends and Family: More than three times a week    Frequency of Social Gatherings with Friends and Family: Twice a week    Attends Religious Services: Patient declined    Database administrator or Organizations: Yes    Attends Engineer, structural: More than 4 times per year    Marital Status: Married     Review of Systems: A 12 point ROS discussed and pertinent positives are indicated in the HPI above.  All other systems are negative.  Review of Systems  Vital Signs: BP (!) 172/81 (BP Location: Left Arm, Patient Position: Sitting, Cuff Size: Normal)   Pulse (!) 59   Temp 98 F (36.7 C) (Oral)   Resp 18   SpO2 95%    Physical Exam  Imaging: US  Renal Artery Stenosis Result Date: 01/13/2024 EXAM: Ultrasound Abdomen/Pelvis Doppler TECHNIQUE: Grayscale imaging was done with attention to the kidneys. Doppler imaging was done. Doppler grayscale M-mode spectral analysis of renal waveforms was performed. Velocity measurements were measured. Ratios were calculated. Color flow imaging was reviewed. COMPARISON: None available. CLINICAL HISTORY: HYPERTENSION. FINDINGS: PSV aorta at level of SMA = 47 cm/sec Right Renal Artery: Origin (PSV/EDV): Origin right main renal  artery velocities 150 cm/sec Proximal: Proximal right main renal artery velocities 150 cm/sec Mid: Mid right renal artery velocities 164 cm/sec RAR = 3.5 Distal: Distal right main renal artery velocities 70 cm/sec Accessory branches: Not seen. Renal vein patent: Yes. Right renal length: Right kidney length 10.5 cm Cortical thickness: About Right cortical thickness normal. Cortical velocities: Upper pole: Right upper cortical velocities with resistive index of 0.59 Right upper cortical resistive index 0.59 Mid pole: Mid right cortical velocities with RI of 0.70 Mid right cortical RI 0.70 Lower pole: Lower right cortical velocities with RI of 0.70 Lower right cortical RI 0.70 Left Renal Artery: Origin: Left main renal artery origin velocities 81 cm/sec Proximal: Proximal left main renal artery velocities 81 cm/sec Mid: Mid left main renal artery velocities 137 cm/sec RAR = 2.9 Distal: Distal left main renal artery velocities 50 cm/sec Accessory branches: Not seen. Renal vein patent: Yes. Kidney length: Left kidney length 11.4 cm long. Cortical thickness: About Left kidney cortical thickness normal. Cortical velocities: Upper pole Left cortical velocities upper pole with resistive index of 0.67 Upper cortex left kidney RI 0.67 Mid pole: Left cortical velocities, middle pole with resistive index of 0.71 Mid left kidney RI 0.71 Lower pole: Left cortical velocities lower pole with resistive index of 0.71 Lower left renal cortex RI 0.71 IMPRESSION: 1. Peak renal artery to aortic ratio within the mid segment of the right renal artery suggests a hemodynamically significant stenosis in this location. Dedicated CT arteriography or formal arteriography is recommended for further evaluation. . Electronically signed by: Dorethia Molt MD 01/13/2024 09:48 PM EDT RP Workstation: [id number]    Labs:  CBC: Recent Labs    01/26/23 0801 07/18/23 0808  WBC 8.2 8.0  HGB 14.5 14.5  HCT 43.5 42.2  PLT 274 267    COAGS: No  results for input(s): INR, APTT in the last 8760 hours.  BMP: Recent Labs    01/26/23 0801 07/18/23 0808  NA 136 137  K 4.1 3.8  CL 96 97  CO2 25 25  GLUCOSE 93 92  BUN 15 17  CALCIUM 9.7 9.7  CREATININE 0.86 0.85    LIVER FUNCTION TESTS: Recent Labs    01/26/23 0801 07/18/23 0808  BILITOT 0.6 0.7  AST 21 21  ALT 15 23  ALKPHOS 81 80  PROT 6.8 6.8  ALBUMIN 4.3 4.4    TUMOR MARKERS: No results for input(s): AFPTM, CEA, CA199, CHROMGRNA in the last 8760 hours.  Assessment and Plan:  Hypertension without renal artery stenosis.  No intervention planned.  The patient may benefit with consult for renal artery denervation (a service I do not perform) or consult with cardiovascular or nephrology.    Electronically Signed: Cordella DELENA Banner, MD   01/24/2024, 5:31 PM     I spent a total of  40 Minutes   in face to face in clinical consultation, greater than 50% of which was counseling/coordinating care for poorly controlled hypertension.

## 2024-01-31 ENCOUNTER — Ambulatory Visit: Payer: Self-pay | Admitting: Family Medicine

## 2024-02-08 ENCOUNTER — Ambulatory Visit

## 2024-02-15 ENCOUNTER — Other Ambulatory Visit: Payer: Self-pay | Admitting: Family Medicine

## 2024-02-15 DIAGNOSIS — I10 Essential (primary) hypertension: Secondary | ICD-10-CM

## 2024-02-19 ENCOUNTER — Other Ambulatory Visit: Payer: Self-pay

## 2024-02-19 DIAGNOSIS — I10 Essential (primary) hypertension: Secondary | ICD-10-CM

## 2024-02-19 DIAGNOSIS — E782 Mixed hyperlipidemia: Secondary | ICD-10-CM

## 2024-02-20 ENCOUNTER — Other Ambulatory Visit

## 2024-02-20 DIAGNOSIS — E782 Mixed hyperlipidemia: Secondary | ICD-10-CM | POA: Diagnosis not present

## 2024-02-20 DIAGNOSIS — I10 Essential (primary) hypertension: Secondary | ICD-10-CM

## 2024-02-21 DIAGNOSIS — D485 Neoplasm of uncertain behavior of skin: Secondary | ICD-10-CM | POA: Diagnosis not present

## 2024-02-21 DIAGNOSIS — L814 Other melanin hyperpigmentation: Secondary | ICD-10-CM | POA: Diagnosis not present

## 2024-02-21 LAB — CBC WITH DIFFERENTIAL/PLATELET
Basophils Absolute: 0.1 x10E3/uL (ref 0.0–0.2)
Basos: 1 %
EOS (ABSOLUTE): 0.4 x10E3/uL (ref 0.0–0.4)
Eos: 7 %
Hematocrit: 42.5 % (ref 34.0–46.6)
Hemoglobin: 13.7 g/dL (ref 11.1–15.9)
Immature Grans (Abs): 0 x10E3/uL (ref 0.0–0.1)
Immature Granulocytes: 0 %
Lymphocytes Absolute: 1.3 x10E3/uL (ref 0.7–3.1)
Lymphs: 21 %
MCH: 30.3 pg (ref 26.6–33.0)
MCHC: 32.2 g/dL (ref 31.5–35.7)
MCV: 94 fL (ref 79–97)
Monocytes Absolute: 0.5 x10E3/uL (ref 0.1–0.9)
Monocytes: 8 %
Neutrophils Absolute: 4 x10E3/uL (ref 1.4–7.0)
Neutrophils: 63 %
Platelets: 277 x10E3/uL (ref 150–450)
RBC: 4.52 x10E6/uL (ref 3.77–5.28)
RDW: 12.1 % (ref 11.7–15.4)
WBC: 6.3 x10E3/uL (ref 3.4–10.8)

## 2024-02-21 LAB — COMPREHENSIVE METABOLIC PANEL WITH GFR
ALT: 19 IU/L (ref 0–32)
AST: 23 IU/L (ref 0–40)
Albumin: 4.4 g/dL (ref 3.8–4.8)
Alkaline Phosphatase: 79 IU/L (ref 49–135)
BUN/Creatinine Ratio: 19 (ref 12–28)
BUN: 15 mg/dL (ref 8–27)
Bilirubin Total: 0.6 mg/dL (ref 0.0–1.2)
CO2: 26 mmol/L (ref 20–29)
Calcium: 9.9 mg/dL (ref 8.7–10.3)
Chloride: 92 mmol/L — ABNORMAL LOW (ref 96–106)
Creatinine, Ser: 0.77 mg/dL (ref 0.57–1.00)
Globulin, Total: 2.2 g/dL (ref 1.5–4.5)
Glucose: 87 mg/dL (ref 70–99)
Potassium: 3.9 mmol/L (ref 3.5–5.2)
Sodium: 135 mmol/L (ref 134–144)
Total Protein: 6.6 g/dL (ref 6.0–8.5)
eGFR: 82 mL/min/1.73 (ref >59)

## 2024-02-21 LAB — LIPID PANEL
Chol/HDL Ratio: 3.8 ratio (ref 0.0–4.4)
Cholesterol, Total: 182 mg/dL (ref 100–199)
HDL: 48 mg/dL (ref >39)
LDL Chol Calc (NIH): 107 mg/dL — ABNORMAL HIGH (ref 0–99)
Triglycerides: 156 mg/dL — ABNORMAL HIGH (ref 0–149)
VLDL Cholesterol Cal: 27 mg/dL (ref 5–40)

## 2024-02-22 ENCOUNTER — Ambulatory Visit: Admitting: Family Medicine

## 2024-02-22 ENCOUNTER — Encounter: Payer: Self-pay | Admitting: Family Medicine

## 2024-02-22 ENCOUNTER — Ambulatory Visit: Payer: Self-pay | Admitting: Family Medicine

## 2024-02-22 VITALS — BP 124/66 | HR 59 | Temp 98.5°F | Ht 65.5 in | Wt 185.0 lb

## 2024-02-22 DIAGNOSIS — K5909 Other constipation: Secondary | ICD-10-CM

## 2024-02-22 DIAGNOSIS — E782 Mixed hyperlipidemia: Secondary | ICD-10-CM

## 2024-02-22 DIAGNOSIS — I10 Essential (primary) hypertension: Secondary | ICD-10-CM

## 2024-02-22 DIAGNOSIS — K219 Gastro-esophageal reflux disease without esophagitis: Secondary | ICD-10-CM

## 2024-02-22 DIAGNOSIS — L539 Erythematous condition, unspecified: Secondary | ICD-10-CM

## 2024-02-22 MED ORDER — PRAVASTATIN SODIUM 20 MG PO TABS: 20.0000 mg | ORAL_TABLET | Freq: Every day | ORAL | 1 refills | Status: AC

## 2024-02-22 NOTE — Patient Instructions (Addendum)
 HYPERTENSION: continue current bp medicines, but move chlorthalidone  to mid day.  HYPERLIPIDEMIA: Increase pravastatin  to 20 mg before bed.

## 2024-02-22 NOTE — Progress Notes (Unsigned)
 Subjective:  Patient ID: Kimberly Whitney, female    DOB: 1951-03-02  Age: 73 y.o. MRN: [id number]  Chief Complaint  Patient presents with   Medical Management of Chronic Issues    HPI: Discussed the use of AI scribe software for clinical note transcription with the patient, who gave verbal consent to proceed.  History of Present Illness Kimberly Whitney is a 73 year old female with hypertension and hyperlipidemia who presents with acid reflux.  Gastroesophageal reflux symptoms - Persistent acid reflux not adequately controlled with current medication (Voquezna ) - Sucralfate  is difficult to swallow, begins dissolving in mouth, sometimes causes choking and loss of appetite - Residual throat irritation following recent viral illness - No nausea or vomiting - Appointment scheduled with GI this coming week.   Constipation - Constipation since starting reflux medication (voquezna .) - Daily bowel movements achieved with some difficulty - Increased water intake  Blood pressure fluctuations - Blood pressure fluctuating over the past week and a half, with readings ranging from 137/60 to 165/70 mmHg - Today's blood pressure 124/66 mmHg, lower than usual - Currently taking Diovan  320 mg once daily, Toprol  XL 50 mg once daily, chlorthalidone  50 mg once daily, and hydralazine  25 mg three times daily  Lipid management - Recent cholesterol levels show slight improvement - LDL decreased from 111 to 892 mg/dL - Triglycerides previously elevated, now normal - Currently taking pravastatin  10 mg daily  Recent viral upper respiratory illness - Viral illness 1.5 to 2 weeks ago with sore throat, stuffy nose, and earache - Symptoms mostly resolved except for residual throat irritation from acid reflux - No current fever, chills, sweats, or significant respiratory symptoms  Cutaneous lesions - Recent biopsy performed for a couple of small skin lesions similar to previous basal cell carcinoma diagnosed  last year - No current skin problems aside from these lesions  Physical activity and diet - Exercises four times a week at the [facility] - Mindful of dietary habits       11/28/2023   10:36 AM 09/26/2023    3:06 PM 07/20/2023   10:42 AM 02/02/2023   10:26 AM 01/31/2022    1:59 PM  Depression screen PHQ 2/9  Decreased Interest 0 0 0 0 0  Down, Depressed, Hopeless 0 0 0 0 0  PHQ - 2 Score 0 0 0 0 0  Altered sleeping 0 1  0 0  Tired, decreased energy 0 3  0 0  Change in appetite 0 1  0 0  Feeling bad or failure about yourself  0 0  0 0  Trouble concentrating 0 0  0 0  Moving slowly or fidgety/restless 0 0  0 0  Suicidal thoughts 0 0  0 0  PHQ-9 Score 0 5  0 0  Difficult doing work/chores Not difficult at all Somewhat difficult  Not difficult at all Not difficult at all        11/28/2023   10:36 AM  Fall Risk   Falls in the past year? 0  Number falls in past yr: 0  Injury with Fall? 0  Risk for fall due to : No Fall Risks  Follow up Falls evaluation completed    Patient Care Team: Sherre Clapper, MD as PCP - General (Family Medicine) Jerri Kay HERO, MD as Attending Physician (Orthopedic Surgery)   Review of Systems  Constitutional:  Negative for chills, fatigue and fever.  HENT:  Positive for sore throat. Negative for congestion and ear pain.   Respiratory:  Negative for cough and shortness of breath.   Cardiovascular:  Negative for chest pain.  Gastrointestinal:  Positive for constipation. Negative for abdominal pain, diarrhea, nausea and vomiting.       Reflux present despite carafate  and voquezna  20 mg daily.   Genitourinary:  Negative for dysuria and urgency.  Musculoskeletal:  Negative for arthralgias, back pain and myalgias.  Neurological:  Negative for dizziness and headaches.  Psychiatric/Behavioral:  Negative for dysphoric mood. The patient is not nervous/anxious.     Current Outpatient Medications on File Prior to Visit  Medication Sig Dispense Refill   aspirin EC  81 MG tablet Take 81 mg by mouth daily.     calcium-vitamin D (OSCAL WITH D) 500-200 MG-UNIT tablet Take 1 tablet by mouth 2 (two) times daily.     cetirizine (ZYRTEC) 10 MG tablet Take 10 mg by mouth daily.     chlorthalidone  (HYGROTON ) 50 MG tablet TAKE ONE TABLET BY MOUTH ONCE DAILY 90 tablet 1   cholecalciferol (VITAMIN D3) 25 MCG (1000 UNIT) tablet Take 2,000 Units by mouth daily.     escitalopram  (LEXAPRO ) 5 MG tablet Take 1 tablet (5 mg total) by mouth daily. 90 tablet 1   fluticasone  (FLONASE ) 50 MCG/ACT nasal spray Place 2 sprays into both nostrils daily. 16 g 6   hydrALAZINE  (APRESOLINE ) 25 MG tablet Take 1 tablet (25 mg total) by mouth 3 (three) times daily. 90 tablet 2   MAGNESIUM  PO Take 1 each by mouth daily.     metoprolol  succinate (TOPROL -XL) 50 MG 24 hr tablet Take 1 tablet (50 mg total) by mouth daily. Take with or immediately following a meal. 90 tablet 3   nystatin cream (MYCOSTATIN) Apply 1 Application topically 2 (two) times daily as needed for dry skin. (Patient not taking: Reported on 02/28/2024)     ondansetron  (ZOFRAN ) 4 MG tablet Take 1 tablet (4 mg total) by mouth every 8 (eight) hours as needed for nausea or vomiting. 20 tablet 0   sucralfate  (CARAFATE ) 1 g tablet Take 1 tablet (1 g total) by mouth 4 (four) times daily -  with meals and at bedtime. 120 tablet 1   triamcinolone cream (KENALOG) 0.1 % Apply 1 application topically 2 (two) times daily.     valsartan  (DIOVAN ) 320 MG tablet Take 1 tablet (320 mg total) by mouth daily. 90 tablet 1   No current facility-administered medications on file prior to visit.   Past Medical History:  Diagnosis Date   Basal cell carcinoma (BCC) 09/2021   right forearm   Blood transfusion without reported diagnosis    with first hip fracture   Cancer [facility])    Cataract    Closed nondisplaced intertrochanteric fracture of left femur (HCC)    GERD (gastroesophageal reflux disease)    Hypertension    Intertrochanteric fracture of  left hip (HCC) 01/29/2020   Osteopenia    Squamous cell cancer of skin of forearm, left    Squamous cell carcinoma of leg, left    Past Surgical History:  Procedure Laterality Date   BREAST EXCISIONAL BIOPSY Right    years ago   CHOLECYSTECTOMY     FRACTURE SURGERY     L hip 30 years ago   INTRAMEDULLARY (IM) NAIL INTERTROCHANTERIC Left 01/23/2020   Procedure: INTRAMEDULLARY (IM) NAIL INTERTROCHANTRIC;  Surgeon: Jerri Kay HERO, MD;  Location: [facility] OR;  Service: Orthopedics;  Laterality: Left;   IR RADIOLOGIST EVAL & MGMT  01/24/2024   right cataract  05/2021  Family History  Problem Relation Age of Onset   Transient ischemic attack Mother    Dementia Mother    Cancer Father    Hypertension Father    Osteoarthritis Sister    Depression Sister    Melanoma Brother    Basal cell carcinoma Brother    Squamous cell carcinoma Brother    Breast cancer Maternal Aunt    Breast cancer Maternal Aunt    Breast cancer Paternal Aunt    Pancreatic cancer Paternal Uncle    Esophageal cancer Other    Colon cancer Neg Hx    Stomach cancer Neg Hx    Social History   Socioeconomic History   Marital status: Married    Spouse name: Not on file   Number of children: 3   Years of education: Not on file   Highest education level: 12th grade  Occupational History   Not on file  Tobacco Use   Smoking status: Former    Current packs/day: 0.00    Types: Cigarettes    Quit date: 1980    Years since quitting: 45.8   Smokeless tobacco: Never  Vaping Use   Vaping status: Never Used  Substance and Sexual Activity   Alcohol use: Never   Drug use: Never   Sexual activity: Not on file  Other Topics Concern   Not on file  Social History Narrative   Not on file   Social Drivers of Health   Financial Resource Strain: Low Risk  (02/18/2024)   Overall Financial Resource Strain (CARDIA)    Difficulty of Paying Living Expenses: Not hard at all  Food Insecurity: No Food Insecurity (02/18/2024)    Hunger Vital Sign    Worried About Running Out of Food in the Last Year: Never true    Ran Out of Food in the Last Year: Never true  Transportation Needs: No Transportation Needs (02/18/2024)   PRAPARE - Administrator, Civil Service (Medical): No    Lack of Transportation (Non-Medical): No  Physical Activity: Sufficiently Active (02/18/2024)   Exercise Vital Sign    Days of Exercise per Week: 4 days    Minutes of Exercise per Session: 70 min  Stress: No Stress Concern Present (02/18/2024)   [employer] of Occupational Health - Occupational Stress Questionnaire    Feeling of Stress: Not at all  Social Connections: Socially Integrated (02/18/2024)   Social Connection and Isolation Panel    Frequency of Communication with Friends and Family: More than three times a week    Frequency of Social Gatherings with Friends and Family: Twice a week    Attends Religious Services: 1 to 4 times per year    Active Member of [employer] or Organizations: Yes    Attends Engineer, Structural: More than 4 times per year    Marital Status: Married    Objective:  BP 124/66   Pulse (!) 59   Temp 98.5 F (36.9 C)   Ht 5' 5.5 (1.664 m)   Wt 185 lb (83.9 kg)   SpO2 98%   BMI 30.32 kg/m      02/28/2024    8:50 AM 02/28/2024    8:38 AM 02/28/2024    8:28 AM  BP/Weight  Systolic BP 127 125 128  Diastolic BP 68 64 60    Physical Exam Vitals reviewed.  Constitutional:      Appearance: Normal appearance. She is normal weight.  HENT:     Right Ear: Tympanic membrane normal.  Left Ear: Tympanic membrane normal.     Nose:     Right Turbinates: Not swollen.     Left Turbinates: Swollen (erythema and blood in septum of nostril).  Neck:     Vascular: No carotid bruit.  Cardiovascular:     Rate and Rhythm: Normal rate and regular rhythm.     Heart sounds: Normal heart sounds.  Pulmonary:     Effort: Pulmonary effort is normal. No respiratory distress.     Breath  sounds: Normal breath sounds.  Abdominal:     General: Abdomen is flat. Bowel sounds are normal.     Palpations: Abdomen is soft.     Tenderness: There is no abdominal tenderness.  Neurological:     Mental Status: She is alert and oriented to person, place, and time.  Psychiatric:        Mood and Affect: Mood normal.        Behavior: Behavior normal.         Lab Results  Component Value Date   WBC 6.3 02/20/2024   HGB 13.7 02/20/2024   HCT 42.5 02/20/2024   PLT 277 02/20/2024   GLUCOSE 87 02/20/2024   CHOL 182 02/20/2024   TRIG 156 (H) 02/20/2024   HDL 48 02/20/2024   LDLCALC 107 (H) 02/20/2024   ALT 19 02/20/2024   AST 23 02/20/2024   NA 135 02/20/2024   K 3.9 02/20/2024   CL 92 (L) 02/20/2024   CREATININE 0.77 02/20/2024   BUN 15 02/20/2024   CO2 26 02/20/2024   TSH 1.760 01/02/2024   INR 1.0 01/23/2020   HGBA1C 5.5 10/09/2020    Results for orders placed or performed in visit on 02/20/24  Lipid panel   Collection Time: 02/20/24  7:59 AM  Result Value Ref Range   Cholesterol, Total 182 100 - 199 mg/dL   Triglycerides 843 (H) 0 - 149 mg/dL   HDL 48 >60 mg/dL   VLDL Cholesterol Cal 27 5 - 40 mg/dL   LDL Chol Calc (NIH) 892 (H) 0 - 99 mg/dL   Chol/HDL Ratio 3.8 0.0 - 4.4 ratio  Comprehensive metabolic panel with GFR   Collection Time: 02/20/24  7:59 AM  Result Value Ref Range   Glucose 87 70 - 99 mg/dL   BUN 15 8 - 27 mg/dL   Creatinine, Ser 9.22 0.57 - 1.00 mg/dL   eGFR 82 >40 fO/fpw/8.26   BUN/Creatinine Ratio 19 12 - 28   Sodium 135 134 - 144 mmol/L   Potassium 3.9 3.5 - 5.2 mmol/L   Chloride 92 (L) 96 - 106 mmol/L   CO2 26 20 - 29 mmol/L   Calcium 9.9 8.7 - 10.3 mg/dL   Total Protein 6.6 6.0 - 8.5 g/dL   Albumin 4.4 3.8 - 4.8 g/dL   Globulin, Total 2.2 1.5 - 4.5 g/dL   Bilirubin Total 0.6 0.0 - 1.2 mg/dL   Alkaline Phosphatase 79 49 - 135 IU/L   AST 23 0 - 40 IU/L   ALT 19 0 - 32 IU/L  CBC with Differential/Platelet   Collection Time: 02/20/24   7:59 AM  Result Value Ref Range   WBC 6.3 3.4 - 10.8 x10E3/uL   RBC 4.52 3.77 - 5.28 x10E6/uL   Hemoglobin 13.7 11.1 - 15.9 g/dL   Hematocrit 57.4 65.9 - 46.6 %   MCV 94 79 - 97 fL   MCH 30.3 26.6 - 33.0 pg   MCHC 32.2 31.5 - 35.7 g/dL   RDW  12.1 11.7 - 15.4 %   Platelets 277 150 - 450 x10E3/uL   Neutrophils 63 Not Estab. %   Lymphs 21 Not Estab. %   Monocytes 8 Not Estab. %   Eos 7 Not Estab. %   Basos 1 Not Estab. %   Neutrophils Absolute 4.0 1.4 - 7.0 x10E3/uL   Lymphocytes Absolute 1.3 0.7 - 3.1 x10E3/uL   Monocytes Absolute 0.5 0.1 - 0.9 x10E3/uL   EOS (ABSOLUTE) 0.4 0.0 - 0.4 x10E3/uL   Basophils Absolute 0.1 0.0 - 0.2 x10E3/uL   Immature Granulocytes 0 Not Estab. %   Immature Grans (Abs) 0.0 0.0 - 0.1 x10E3/uL  .  Assessment & Plan:   Assessment & Plan Mixed hyperlipidemia Slight improvement in LDL from 111 mg/dL to 892 mg/dL, but not at goal. Triglycerides normalized. Treated with pravastatin  10 mg daily. - Increase pravastatin  20 mg daily.    Essential hypertension Hypertension with fluctuating readings. Home readings elevated; office reading lower. Current regimen includes Diovan , Toprol  XL, chlorthalidone , and hydralazine .  - Continue current bp medicines, but move chlorthalidone  to mid day.  - Continue diovan  in am and toprol  xl in am. Continue hydralazine  three times a day.  - Consider referral to hypertension clinic if blood pressure remains uncontrolled.    Other constipation Mild constipation possibly related to reflux medication. Daily bowel movements but difficult. - Consider stool softener if needed. - Encourage increased water intake.    Gastroesophageal reflux disease without esophagitis Chronic GERD with persistent symptoms despite Voquezna  and sucralfate . Sucralfate  causes ingestion difficulty. Gastroenterology referral for further evaluation and potential endoscopy scheduled. - Continue Voquezna  20 mg and consider holding sucralfate  due to  swallowing difficulty. - Keep appointment with gastroenterologist for further evaluation and potential endoscopy.    Erythema of nasal skin Nasal mucosal irritation possibly due to Flonase . No epistaxis, but irritation noted. - Consider break from Flonase  for one week. - Use saline nasal spray as alternative.      Body mass index is 30.32 kg/m.    Meds ordered this encounter  Medications   pravastatin  (PRAVACHOL ) 20 MG tablet    Sig: Take 1 tablet (20 mg total) by mouth daily.    Dispense:  90 tablet    Refill:  1    No orders of the defined types were placed in this encounter.   LILLETTE Kato I Leal-Borjas,acting as a scribe for Abigail Free, MD.,have documented all relevant documentation on the behalf of Abigail Free, MD,as directed by  Abigail Free, MD while in the presence of Abigail Free, MD.    Follow-up: Return in about 3 months (around 05/24/2024) for chronic follow up.  An After Visit Summary was printed and given to the patient.  I attest that I have reviewed this visit and agree with the plan scribed by my staff.   Abigail Free, MD [facility] [phone]

## 2024-02-25 NOTE — Assessment & Plan Note (Signed)
 Chronic GERD with persistent symptoms despite Voquezna  and sucralfate . Sucralfate  causes ingestion difficulty. Gastroenterology referral for further evaluation and potential endoscopy scheduled. - Continue Voquezna  20 mg and consider holding sucralfate  due to swallowing difficulty. - Keep appointment with gastroenterologist for further evaluation and potential endoscopy.

## 2024-02-25 NOTE — Assessment & Plan Note (Signed)
 Hypertension with fluctuating readings. Home readings elevated; office reading lower. Current regimen includes Diovan , Toprol  XL, chlorthalidone , and hydralazine . Hydralazine  can be increased. Consider hypertension clinic referral if uncontrolled. - Increase hydralazine  dosage. - Recheck blood pressure in office. - Consider referral to hypertension clinic if blood pressure remains uncontrolled.

## 2024-02-25 NOTE — Assessment & Plan Note (Signed)
 Slight improvement in LDL from 111 mg/dL to 892 mg/dL. Triglycerides normalized. Treated with pravastatin  10 mg daily. - Continue pravastatin  10 mg daily.

## 2024-02-26 ENCOUNTER — Ambulatory Visit (INDEPENDENT_AMBULATORY_CARE_PROVIDER_SITE_OTHER): Admitting: Physician Assistant

## 2024-02-26 ENCOUNTER — Encounter: Payer: Self-pay | Admitting: Physician Assistant

## 2024-02-26 ENCOUNTER — Encounter: Payer: Self-pay | Admitting: Pediatrics

## 2024-02-26 VITALS — BP 132/68 | HR 65 | Ht 65.5 in | Wt 185.0 lb

## 2024-02-26 DIAGNOSIS — R1310 Dysphagia, unspecified: Secondary | ICD-10-CM

## 2024-02-26 DIAGNOSIS — K219 Gastro-esophageal reflux disease without esophagitis: Secondary | ICD-10-CM | POA: Diagnosis not present

## 2024-02-26 MED ORDER — ESOMEPRAZOLE MAGNESIUM 40 MG PO CPDR: 40.0000 mg | DELAYED_RELEASE_CAPSULE | Freq: Two times a day (BID) | ORAL | 11 refills | Status: DC

## 2024-02-26 NOTE — Patient Instructions (Addendum)
 _______________________________________________________  If your blood pressure at your visit was 140/90 or greater, please contact your primary care physician to follow up on this.  _______________________________________________________  If you are age 72 or older, your body mass index should be between 23-30. Your Body mass index is 30.32 kg/m. If this is out of the aforementioned range listed, please consider follow up with your Primary Care Provider.  If you are age 93 or younger, your body mass index should be between 19-25. Your Body mass index is 30.32 kg/m. If this is out of the aformentioned range listed, please consider follow up with your Primary Care Provider.   ________________________________________________________  The  GI providers would like to encourage you to use MYCHART to communicate with providers for non-urgent requests or questions.  Due to long hold times on the telephone, sending your provider a message by [facility] may be a faster and more efficient way to get a response.  Please allow 48 business hours for a response.  Please remember that this is for non-urgent requests.  _______________________________________________________  [facility] is using a team-based approach to care.  Your team is made up of your doctor and two to three APPS. Our APPS (Nurse Practitioners and Physician Assistants) work with your physician to ensure care continuity for you. They are fully qualified to address your health concerns and develop a treatment plan. They communicate directly with your gastroenterologist to care for you. Seeing the Advanced Practice Practitioners on your physician's team can help you by facilitating care more promptly, often allowing for earlier appointments, access to diagnostic testing, procedures, and other specialty referrals.   You have been scheduled for an endoscopy. Please follow written instructions given to you at your visit  today.  If you use inhalers (even only as needed), please bring them with you on the day of your procedure.  If you take any of the following medications, they will need to be adjusted prior to your procedure:   DO NOT TAKE 7 DAYS PRIOR TO TEST- Trulicity (dulaglutide) Ozempic, Wegovy (semaglutide) Mounjaro (tirzepatide) Bydureon Bcise (exanatide extended release)  DO NOT TAKE 1 DAY PRIOR TO YOUR TEST Rybelsus (semaglutide) Adlyxin (lixisenatide) Victoza (liraglutide) Byetta (exanatide) ___________________________________________________________________________  Due to recent changes in healthcare laws, you may see the results of your imaging and laboratory studies on MyChart before your provider has had a chance to review them.  We understand that in some cases there may be results that are confusing or concerning to you. Not all laboratory results come back in the same time frame and the provider may be waiting for multiple results in order to interpret others.  Please give us  48 hours in order for your provider to thoroughly review all the results before contacting the office for clarification of your results.    Sending a medication called Carafate , this mechanically coats your stomach. Can cause constipation and darker stools. Take about 30 mins to 1 hour before food and before bed.  If the pill is too large to take you can make it more like a liquid by doing this, can dissolve it in warm water,  in small orange juice glass or shot glass and take it more as a liquid.  Reflux Gourmet Rescue  It is an ALGINATE THERAPY which is the only intervention that works to safeguard the esophagus by creating a protective barrier that actually stops reflux from happening. -The general directions for use are as stated on the packaging: Take 1 teaspoon (5 ml), or  more as needed or as directed by your physician, after meals and before bed. -These general directions address the most common times  for reflux to occur, but our Rescue products may be taken anytime. Some individuals may take our product preemptively, when they know they will suffer from reflux, or as needed - when discomfort arises. (If taken around food, it should be consumed last.) -You do not have to take 1 teaspoon (5 ml) of the product. While one teaspoon (5ml) may be the perfect average amount to relieve reflux suffering in some, others may require more or less. You may adjust the amount of Mint Chocolate Rescue and Vanilla Caramel Rescue to the lowest amount necessary to meet your individual needs to improve your quality of life. -You may dilute the product if it is too viscous for you to consume. Keep in mind, however, that the thickness of the product was formulated to provide optimal coating and protection of your throat and esophagus. Though diluting the product is possible, it may reduce the protective function and/or length of action. -This can be used in conjunction with reflux medications and lifestyle changes.  100% ALL-NATURAL  Paraben FREE, glycerin FREE, & potassium FREE  Made entirely from all-natural ingredients considered safe for children and during pregnancy  No known side effects  All-natural flavor Gluten FREE  Allergen FREE  Vegan  Can find more information here: nameseizer.co.nz   Silent reflux: Not all heartburn burns...SABRASABRASABRA  What is LPR? Laryngopharyngeal reflux (LPR) or silent reflux is a condition in which acid that is made in the stomach travels up the esophagus (swallowing tube) and gets to the throat. Not everyone with reflux has a lot of heartburn or indigestion. In fact, many people with LPR never have heartburn. This is why LPR is called SILENT REFLUX, and the terms Silent reflux and LPR are often used interchangeably. Because LPR is silent, it is sometimes difficult to diagnose.  How can you tell if you have LPR?  Chronic hoarseness- Some people  have hoarseness that comes and goes throat clearing  Cough It can cause shortness of breath and cause asthma like symptoms. a feeling of a lump in the throat  difficulty swallowing a problem with too much nose and throat drainage.  Some people will feel their esophagus spasm which feels like their heart beating hard and fast, this will usually be after a meal, at rest, or lying down at night.    How do I treat this? Treatment for LPR should be individualized, and your doctor will suggest the best treatment for you. Generally there are several treatments for LPR: changing habits and diet to reduce reflux,  medications to reduce stomach acid, and  surgery to prevent reflux. Most people with LPR need to modify how and when they eat, as well as take some medication, to get well. Sometimes, nonprescription liquid antacids, such as Maalox, Gelucil and Mylanta are recommended. When used, these antacids should be taken four times each day - one tablespoon one hour after each meal and before bedtime. Dietary and lifestyle changes alone are not often enough to control LPR - medications that reduce stomach acid are also usually needed. These must be prescribed by our doctor.   TIPS FOR REDUCING REFLUX AND LPR Control your LIFE-STYLE and your DIET! If you use tobacco, QUIT.  Smoking makes you reflux. After every cigarette you have some LPR.  Don't wear clothing that is too tight, especially around the waist (trousers, corsets, belts).  Do  not lie down just after eating...in fact, do not eat within three hours of bedtime.  You should be on a low-fat diet.  Limit your intake of red meat.  Limit your intake of butter.  Avoid fried foods.  Avoid chocolate  Avoid cheese.  Avoid eggs. Specifically avoid caffeine (especially coffee and tea), soda pop (especially cola) and mints.  Avoid alcoholic beverages, particularly in the evening.   Silent reflux: Not all heartburn burns...SABRASABRASABRA  What is  LPR? Laryngopharyngeal reflux (LPR) or silent reflux is a condition in which acid that is made in the stomach travels up the esophagus (swallowing tube) and gets to the throat. Not everyone with reflux has a lot of heartburn or indigestion. In fact, many people with LPR never have heartburn. This is why LPR is called SILENT REFLUX, and the terms Silent reflux and LPR are often used interchangeably. Because LPR is silent, it is sometimes difficult to diagnose.  How can you tell if you have LPR?  Chronic hoarseness- Some people have hoarseness that comes and goes throat clearing  Cough It can cause shortness of breath and cause asthma like symptoms. a feeling of a lump in the throat  difficulty swallowing a problem with too much nose and throat drainage.  Some people will feel their esophagus spasm which feels like their heart beating hard and fast, this will usually be after a meal, at rest, or lying down at night.    How do I treat this? Treatment for LPR should be individualized, and your doctor will suggest the best treatment for you. Generally there are several treatments for LPR: changing habits and diet to reduce reflux,  medications to reduce stomach acid, and  surgery to prevent reflux. Most people with LPR need to modify how and when they eat, as well as take some medication, to get well. Sometimes, nonprescription liquid antacids, such as Maalox, Gelucil and Mylanta are recommended. When used, these antacids should be taken four times each day - one tablespoon one hour after each meal and before bedtime. Dietary and lifestyle changes alone are not often enough to control LPR - medications that reduce stomach acid are also usually needed. These must be prescribed by our doctor.   TIPS FOR REDUCING REFLUX AND LPR Control your LIFE-STYLE and your DIET! If you use tobacco, QUIT.  Smoking makes you reflux. After every cigarette you have some LPR.  Don't wear clothing that is too  tight, especially around the waist (trousers, corsets, belts).  Do not lie down just after eating...in fact, do not eat within three hours of bedtime.  You should be on a low-fat diet.  Limit your intake of red meat.  Limit your intake of butter.  Avoid fried foods.  Avoid chocolate  Avoid cheese.  Avoid eggs. Specifically avoid caffeine (especially coffee and tea), soda pop (especially cola) and mints.  Avoid alcoholic beverages, particularly in the evening.   Gastroparesis Gastroparesis is a condition in which food takes longer than normal to empty from the stomach.  This condition is also known as delayed gastric emptying. It is usually a long-term (chronic) condition.  What are the signs or symptoms? Symptoms of this condition include: Feeling full after eating very little or a loss of appetite. Nausea, vomiting, or heartburn. Bloating of your abdomen. Inconsistent blood sugar (glucose) levels on blood tests. Unexplained weight loss. Acid from the stomach coming up into the esophagus (gastroesophageal reflux). Sudden tightening (spasm) of the stomach, which can be painful. Symptoms  may come and go. Some people may not notice any symptoms.  What increases the risk? You are more likely to develop this condition if: You have certain disorders or diseases. These may include: An endocrine disorder. An eating disorder. Amyloidosis. Scleroderma. Parkinson's disease. Multiple sclerosis. Cancer or infection of the stomach or the vagus nerve. You have had surgery on your stomach or vagus nerve. You take certain medicines. You are female.  Things you can do: Please do small frequent meals like 4-6 meals a day.  Eat and drink liquids at separate times.  Avoid high fiber foods, cook your vegetables, avoid high fat food.  Suggest spreading protein throughout the day (greek yogurt, glucerna, soft meat, milk, eggs) Choose soft foods that you can mash with a fork When you are more  symptomatic, change to pureed foods foods and liquids.  Consider reading Living well with Gastroparesis by [facility] Check out this link to a diet online https://my.groupjournal.fr

## 2024-02-26 NOTE — Progress Notes (Unsigned)
 [facility] History and Physical   Primary Care Physician:  Sherre Clapper, MD   Reason for Procedure:  GERD, dysphagia, LPR, nausea  Plan:    EGD with possible dilation     HPI: Kimberly Whitney is a 73 y.o. female undergoing EGD with possible dilation.  Patient reports a history of GERD since January 2023 with associated nausea and LPR symptoms.  Intermittent dysphagia to meats, breads and pills.  Patient has ongoing symptoms despite famotidine , omeprazole , Voquezna , Carafate .  H. pylori - 07/2023.  CT angio of the abdomen pelvis normal September 2025.   Past Medical History:  Diagnosis Date   Basal cell carcinoma (BCC) 09/2021   right forearm   Blood transfusion without reported diagnosis    with first hip fracture   Cancer [facility])    Cataract    Closed nondisplaced intertrochanteric fracture of left femur (HCC)    GERD (gastroesophageal reflux disease)    Hypertension    Intertrochanteric fracture of left hip (HCC) 01/29/2020   Osteopenia    Squamous cell cancer of skin of forearm, left    Squamous cell carcinoma of leg, left     Past Surgical History:  Procedure Laterality Date   BREAST EXCISIONAL BIOPSY Right    years ago   CHOLECYSTECTOMY     FRACTURE SURGERY     L hip 30 years ago   INTRAMEDULLARY (IM) NAIL INTERTROCHANTERIC Left 01/23/2020   Procedure: INTRAMEDULLARY (IM) NAIL INTERTROCHANTRIC;  Surgeon: Jerri Kay HERO, MD;  Location: [facility] OR;  Service: Orthopedics;  Laterality: Left;   IR RADIOLOGIST EVAL & MGMT  01/24/2024   right cataract  05/2021    Prior to Admission medications   Medication Sig Start Date End Date Taking? Authorizing Provider  aspirin EC 81 MG tablet Take 81 mg by mouth daily. 07/05/19   [provider]  calcium-vitamin D (OSCAL WITH D) 500-200 MG-UNIT tablet Take 1 tablet by mouth 2 (two) times daily.    [provider]  cetirizine (ZYRTEC) 10 MG tablet Take 10 mg by mouth daily. 07/19/16   [provider]   chlorthalidone  (HYGROTON ) 50 MG tablet TAKE ONE TABLET BY MOUTH ONCE DAILY 02/15/24   Cox, Kirsten, MD  cholecalciferol (VITAMIN D3) 25 MCG (1000 UNIT) tablet Take 2,000 Units by mouth daily.    [provider]  escitalopram  (LEXAPRO ) 5 MG tablet Take 1 tablet (5 mg total) by mouth daily. 01/02/24   Sherre Clapper, MD  esomeprazole (NEXIUM) 40 MG capsule Take 1 capsule (40 mg total) by mouth 2 (two) times daily before a meal. 02/26/24   Craig Alan SAUNDERS, PA-C  fluticasone  (FLONASE ) 50 MCG/ACT nasal spray Place 2 sprays into both nostrils daily. 08/18/23   CoxClapper, MD  hydrALAZINE  (APRESOLINE ) 25 MG tablet Take 1 tablet (25 mg total) by mouth 3 (three) times daily. 01/02/24   Sherre Clapper, MD  MAGNESIUM  PO Take 1 each by mouth daily. 07/15/15   [provider]  metoprolol  succinate (TOPROL -XL) 50 MG 24 hr tablet Take 1 tablet (50 mg total) by mouth daily. Take with or immediately following a meal. 08/18/23   Cox, Kirsten, MD  nystatin cream (MYCOSTATIN) Apply 1 Application topically 2 (two) times daily as needed for dry skin. 04/19/23   [provider]  ondansetron  (ZOFRAN ) 4 MG tablet Take 1 tablet (4 mg total) by mouth every 8 (eight) hours as needed for nausea or vomiting. 10/02/23   Cox, Clapper, MD  pravastatin  (PRAVACHOL ) 20 MG tablet Take 1  tablet (20 mg total) by mouth daily. 02/22/24   CoxAbigail, MD  sucralfate  (CARAFATE ) 1 g tablet Take 1 tablet (1 g total) by mouth 4 (four) times daily -  with meals and at bedtime. 01/08/24   Cox, Abigail, MD  triamcinolone cream (KENALOG) 0.1 % Apply 1 application topically 2 (two) times daily.    [provider]  valsartan  (DIOVAN ) 320 MG tablet Take 1 tablet (320 mg total) by mouth daily. 10/31/23   CoxAbigail, MD    Current Outpatient Medications  Medication Sig Dispense Refill   aspirin EC 81 MG tablet Take 81 mg by mouth daily.     calcium-vitamin D (OSCAL WITH D) 500-200 MG-UNIT tablet Take 1 tablet by mouth 2  (two) times daily.     cetirizine (ZYRTEC) 10 MG tablet Take 10 mg by mouth daily.     chlorthalidone  (HYGROTON ) 50 MG tablet TAKE ONE TABLET BY MOUTH ONCE DAILY 90 tablet 1   cholecalciferol (VITAMIN D3) 25 MCG (1000 UNIT) tablet Take 2,000 Units by mouth daily.     escitalopram  (LEXAPRO ) 5 MG tablet Take 1 tablet (5 mg total) by mouth daily. 90 tablet 1   fluticasone  (FLONASE ) 50 MCG/ACT nasal spray Place 2 sprays into both nostrils daily. 16 g 6   hydrALAZINE  (APRESOLINE ) 25 MG tablet Take 1 tablet (25 mg total) by mouth 3 (three) times daily. 90 tablet 2   MAGNESIUM  PO Take 1 each by mouth daily.     metoprolol  succinate (TOPROL -XL) 50 MG 24 hr tablet Take 1 tablet (50 mg total) by mouth daily. Take with or immediately following a meal. 90 tablet 3   pravastatin  (PRAVACHOL ) 20 MG tablet Take 1 tablet (20 mg total) by mouth daily. 90 tablet 1   sucralfate  (CARAFATE ) 1 g tablet Take 1 tablet (1 g total) by mouth 4 (four) times daily -  with meals and at bedtime. 120 tablet 1   valsartan  (DIOVAN ) 320 MG tablet Take 1 tablet (320 mg total) by mouth daily. 90 tablet 1   esomeprazole (NEXIUM) 40 MG capsule Take 1 capsule (40 mg total) by mouth 2 (two) times daily before a meal. (Patient not taking: Reported on 02/28/2024) 60 capsule 11   nystatin cream (MYCOSTATIN) Apply 1 Application topically 2 (two) times daily as needed for dry skin. (Patient not taking: Reported on 02/28/2024)     ondansetron  (ZOFRAN ) 4 MG tablet Take 1 tablet (4 mg total) by mouth every 8 (eight) hours as needed for nausea or vomiting. 20 tablet 0   triamcinolone cream (KENALOG) 0.1 % Apply 1 application topically 2 (two) times daily.     Current Facility-Administered Medications  Medication Dose Route Frequency Provider Last Rate Last Admin   0.9 %  sodium chloride  infusion  500 mL Intravenous Once Braxson Hollingsworth M, MD        Allergies as of 02/28/2024 - Review Complete 02/28/2024  Allergen Reaction Noted   Amlodipine  Other (See Comments) 10/09/2020   Lidocaine  Rash 10/30/2018   Lisinopril Cough 12/13/2018    Family History  Problem Relation Age of Onset   Transient ischemic attack Mother    Dementia Mother    Cancer Father    Hypertension Father    Osteoarthritis Sister    Depression Sister    Melanoma Brother    Basal cell carcinoma Brother    Squamous cell carcinoma Brother    Breast cancer Maternal Aunt    Breast cancer Maternal Aunt    Breast  cancer Paternal Aunt    Pancreatic cancer Paternal Uncle    Esophageal cancer Other    Colon cancer Neg Hx    Stomach cancer Neg Hx     Social History   Socioeconomic History   Marital status: Married    Spouse name: Not on file   Number of children: 3   Years of education: Not on file   Highest education level: 12th grade  Occupational History   Not on file  Tobacco Use   Smoking status: Former    Current packs/day: 0.00    Types: Cigarettes    Quit date: 1980    Years since quitting: 45.8   Smokeless tobacco: Never  Vaping Use   Vaping status: Never Used  Substance and Sexual Activity   Alcohol use: Never   Drug use: Never   Sexual activity: Not on file  Other Topics Concern   Not on file  Social History Narrative   Not on file   Social Drivers of Health   Financial Resource Strain: Low Risk  (02/18/2024)   Overall Financial Resource Strain (CARDIA)    Difficulty of Paying Living Expenses: Not hard at all  Food Insecurity: No Food Insecurity (02/18/2024)   Hunger Vital Sign    Worried About Running Out of Food in the Last Year: Never true    Ran Out of Food in the Last Year: Never true  Transportation Needs: No Transportation Needs (02/18/2024)   PRAPARE - Administrator, Civil Service (Medical): No    Lack of Transportation (Non-Medical): No  Physical Activity: Sufficiently Active (02/18/2024)   Exercise Vital Sign    Days of Exercise per Week: 4 days    Minutes of Exercise per Session: 70 min  Stress:  No Stress Concern Present (02/18/2024)   [employer] of Occupational Health - Occupational Stress Questionnaire    Feeling of Stress: Not at all  Social Connections: Socially Integrated (02/18/2024)   Social Connection and Isolation Panel    Frequency of Communication with Friends and Family: More than three times a week    Frequency of Social Gatherings with Friends and Family: Twice a week    Attends Religious Services: 1 to 4 times per year    Active Member of [employer] or Organizations: Yes    Attends Engineer, Structural: More than 4 times per year    Marital Status: Married  Catering Manager Violence: Not At Risk (01/02/2024)   Humiliation, Afraid, Rape, and Kick questionnaire    Fear of Current or Ex-Partner: No    Emotionally Abused: No    Physically Abused: No    Sexually Abused: No    Review of Systems:  All other review of systems negative except as mentioned in the HPI.  Physical Exam: Vital signs BP (!) 138/94   Pulse 68   Temp (!) 97.5 F (36.4 C)   Resp 10   Ht 5' 5.5 (1.664 m)   Wt 185 lb (83.9 kg)   SpO2 99%   BMI 30.32 kg/m   General:   Alert,  Well-developed, well-nourished, pleasant and cooperative in NAD Airway:  Mallampati 2 Lungs:  Clear throughout to auscultation.   Heart:  Regular rate and rhythm; no murmurs, clicks, rubs,  or gallops. Abdomen:  Soft, nontender and nondistended. Normal bowel sounds.   Neuro/Psych:  Normal mood and affect. A and O x 3  Inocente Hausen, MD [facility]

## 2024-02-26 NOTE — Progress Notes (Unsigned)
 02/27/2024 Kimberly Whitney [id number] 11-19-1950  Referring provider: Sherre Clapper, MD Primary GI doctor: Dr. Suzann  ASSESSMENT AND PLAN:  GERD x Jan 2023 with nausea, LPR symptoms Dysphagia rare meats/breads, pills Persistent symptoms despite famotadine, omeprazole  40 mg BID, Voquezna  10 mg/20 mg and Carafate  Status post cholecystectomy, no weight loss H pylori negative 07/2023 01/24/2024 CT angio abdomen pelvis unremarkable No NSAIDS, tobacco use, ETOH - nexium 40 mg BID -alginate therapy given -Lifestyle changes discussed, avoid NSAIDS, ETOH, hand out given to the patient -Schedule EGD at [facility] with possible dilatation to evaluate GERD, esophagitis, hiatal hernia, stricture I discussed risks of EGD with patient today, including risk of sedation, bleeding or perforation. Patient provides understanding and gave verbal consent to proceed. - consider barium swallow/pH study if EGD is negative -Consider GES, gastroparesis diet given, ? From COVID  [facility] 08/2016 colonoscopy with Dr. Kristie, 1 polyp per patient,  recall 10 years Will get records  I have reviewed the clinic note as outlined by Alan Coombs, PA and agree with the assessment, plan and medical decision making.  Kimberly Whitney is referred to the office for symptoms of GERD, nausea and LPR.  Symptoms have been persistent since January 2023 and refractory to H2 blocker, PPI, Voquezna  and Carafate .  Endorses symptoms of regurgitation.  No documented dysphagia to food but does have dysphagia with large pills such as Carafate .  Agree with proceeding with EGD.  Colonoscopy in 2018 reported to show 1 polyp-histology unknown.  Will review records to determine appropriate follow-up interval.  Kimberly Suzann, MD   Patient Care Team: Sherre Clapper, MD as PCP - General (Family Medicine) Jerri Kay HERO, MD as Attending Physician (Orthopedic Surgery)  HISTORY OF PRESENT ILLNESS: 73 y.o. female with a past medical history listed below  presents for evaluation of GERD.   Discussed the use of AI scribe software for clinical note transcription with the patient, who gave verbal consent to proceed.  History of Present Illness   Kimberly Whitney is a 73 year old female with gastroesophageal reflux disease (GERD) who presents with persistent reflux symptoms.  She has experienced persistent reflux symptoms since January 2023, despite trying various medications including famotidine , omeprazole  20 mg, pantoprazole  40 mg twice daily, and currently Voquezna , without significant relief. She started Carafate  (sucralfate ) on January 02, 2024, but continues to experience throat irritation and acid regurgitation.  She has a history of intermittent heartburn prior to 2023, which worsened over time. Her daughter noticed a persistent cough about a year before the reflux diagnosis, initially thought to be due to allergies. She experiences burning in her throat and occasional difficulty swallowing, particularly with pills like Carafate , which sometimes causes her to gag or nearly vomit.  No significant weight loss, chest pain, or shortness of breath. She experiences bloating and occasional clay-colored stools. She has a history of COVID-19 infections in 2021, 2023, and August 2024.  A CT angiogram of the abdomen and pelvis was performed in September 2025. She has a negative H. pylori test from March 2025 and her gallbladder has been removed. She denies the use of NSAIDs, alcohol, or smoking.  She experiences occasional regurgitation of small amounts of food, especially when leaning forward or during exercise.        She  reports that she quit smoking about 45 years ago. Her smoking use included cigarettes. She has never used smokeless tobacco. She reports that she does not drink alcohol and does not use drugs.  RELEVANT GI HISTORY, IMAGING AND  LABS: Results   LABS H. pylori: Negative (07/2023)  RADIOLOGY CT Angio Abdomen Pelvis: Normal abdomen  and pelvis, no wall thickening or distention, unremarkable pancreas and spleen, gallbladder absent, no ductal dilation, normal liver (01/2024)  DIAGNOSTIC Colonoscopy: Normal (2018)      CBC    Component Value Date/Time   WBC 6.3 02/20/2024 0759   WBC 8.0 01/30/2020 0914   RBC 4.52 02/20/2024 0759   RBC 3.47 (L) 01/30/2020 0914   HGB 13.7 02/20/2024 0759   HCT 42.5 02/20/2024 0759   PLT 277 02/20/2024 0759   MCV 94 02/20/2024 0759   MCH 30.3 02/20/2024 0759   MCH 30.8 01/30/2020 0914   MCHC 32.2 02/20/2024 0759   MCHC 32.5 01/30/2020 0914   RDW 12.1 02/20/2024 0759   LYMPHSABS 1.3 02/20/2024 0759   MONOABS 0.8 01/30/2020 0914   EOSABS 0.4 02/20/2024 0759   BASOSABS 0.1 02/20/2024 0759   Recent Labs    07/18/23 0808 02/20/24 0759  HGB 14.5 13.7    CMP     Component Value Date/Time   NA 135 02/20/2024 0759   K 3.9 02/20/2024 0759   CL 92 (L) 02/20/2024 0759   CO2 26 02/20/2024 0759   GLUCOSE 87 02/20/2024 0759   GLUCOSE 102 (H) 02/03/2020 0614   BUN 15 02/20/2024 0759   CREATININE 0.77 02/20/2024 0759   CALCIUM 9.9 02/20/2024 0759   PROT 6.6 02/20/2024 0759   ALBUMIN 4.4 02/20/2024 0759   AST 23 02/20/2024 0759   ALT 19 02/20/2024 0759   ALKPHOS 79 02/20/2024 0759   BILITOT 0.6 02/20/2024 0759   GFRNONAA >60 02/03/2020 0614   GFRAA >60 02/03/2020 0614      Latest Ref Rng & Units 02/20/2024    7:59 AM 07/18/2023    8:08 AM 01/26/2023    8:01 AM  Hepatic Function  Total Protein 6.0 - 8.5 g/dL 6.6  6.8  6.8   Albumin 3.8 - 4.8 g/dL 4.4  4.4  4.3   AST 0 - 40 IU/L 23  21  21    ALT 0 - 32 IU/L 19  23  15    Alk Phosphatase 49 - 135 IU/L 79  80  81   Total Bilirubin 0.0 - 1.2 mg/dL 0.6  0.7  0.6       Current Medications:    Current Outpatient Medications (Cardiovascular):    chlorthalidone  (HYGROTON ) 50 MG tablet, TAKE ONE TABLET BY MOUTH ONCE DAILY   hydrALAZINE  (APRESOLINE ) 25 MG tablet, Take 1 tablet (25 mg total) by mouth 3 (three) times daily.    metoprolol  succinate (TOPROL -XL) 50 MG 24 hr tablet, Take 1 tablet (50 mg total) by mouth daily. Take with or immediately following a meal.   pravastatin  (PRAVACHOL ) 20 MG tablet, Take 1 tablet (20 mg total) by mouth daily.   valsartan  (DIOVAN ) 320 MG tablet, Take 1 tablet (320 mg total) by mouth daily.  Current Outpatient Medications (Respiratory):    cetirizine (ZYRTEC) 10 MG tablet, Take 10 mg by mouth daily.   fluticasone  (FLONASE ) 50 MCG/ACT nasal spray, Place 2 sprays into both nostrils daily.  Current Outpatient Medications (Analgesics):    aspirin EC 81 MG tablet, Take 81 mg by mouth daily.   Current Outpatient Medications (Other):    calcium-vitamin D (OSCAL WITH D) 500-200 MG-UNIT tablet, Take 1 tablet by mouth 2 (two) times daily.   cholecalciferol (VITAMIN D3) 25 MCG (1000 UNIT) tablet, Take 2,000 Units by mouth daily.   escitalopram  (LEXAPRO ) 5  MG tablet, Take 1 tablet (5 mg total) by mouth daily.   esomeprazole (NEXIUM) 40 MG capsule, Take 1 capsule (40 mg total) by mouth 2 (two) times daily before a meal.   MAGNESIUM  PO, Take 1 each by mouth daily.   nystatin cream (MYCOSTATIN), Apply 1 Application topically 2 (two) times daily as needed for dry skin.   sucralfate  (CARAFATE ) 1 g tablet, Take 1 tablet (1 g total) by mouth 4 (four) times daily -  with meals and at bedtime.   triamcinolone cream (KENALOG) 0.1 %, Apply 1 application topically 2 (two) times daily.   ondansetron  (ZOFRAN ) 4 MG tablet, Take 1 tablet (4 mg total) by mouth every 8 (eight) hours as needed for nausea or vomiting.  Medical History:  Past Medical History:  Diagnosis Date   Basal cell carcinoma (BCC) 09/2021   right forearm   Cancer (HCC)    Closed nondisplaced intertrochanteric fracture of left femur (HCC)    Hypertension    Intertrochanteric fracture of left hip (HCC) 01/29/2020   Osteopenia    Squamous cell cancer of skin of forearm, left    Squamous cell carcinoma of leg, left    Allergies:   Allergies  Allergen Reactions   Amlodipine Other (See Comments)    Ankle edema    Lisinopril Cough   Lidocaine  Rash    Lidocaine  patch     Surgical History:  She  has a past surgical history that includes Cholecystectomy; Fracture surgery; Intramedullary (im) nail intertrochanteric (Left, 01/23/2020); Breast excisional biopsy (Right); right cataract (05/2021); and IR Radiologist Eval & Mgmt (01/24/2024). Family History:  Her family history includes Basal cell carcinoma in her brother; Breast cancer in her maternal aunt, maternal aunt, and paternal aunt; Cancer in her father; Dementia in her mother; Depression in her sister; Esophageal cancer in an other family member; Hypertension in her father; Melanoma in her brother; Osteoarthritis in her sister; Pancreatic cancer in her paternal uncle; Squamous cell carcinoma in her brother; Transient ischemic attack in her mother.  REVIEW OF SYSTEMS  : All other systems reviewed and negative except where noted in the History of Present Illness.  PHYSICAL EXAM: BP 132/68   Pulse 65   Ht 5' 5.5 (1.664 m)   Wt 185 lb (83.9 kg)   BMI 30.32 kg/m  Physical Exam   GENERAL APPEARANCE: Well nourished, in no apparent distress. HEENT: No cervical lymphadenopathy, unremarkable thyroid, sclerae anicteric, conjunctiva pink. RESPIRATORY: Respiratory effort normal, breath sounds equal bilaterally without rales, rhonchi, wheezing. Lungs clear to auscultation bilaterally. CARDIO: Regular rate and rhythm with no murmurs, rubs, or gallops, peripheral pulses intact. ABDOMEN: Soft, non-distended, active bowel sounds in all four quadrants, tenderness to palpation, no rebound, no mass appreciated. RECTAL: Declines. MUSCULOSKELETAL: Full range of motion, normal gait, without edema. SKIN: Dry, intact without rashes or lesions. No jaundice. NEURO: Alert, oriented, no focal deficits. PSYCH: Cooperative, normal mood and affect.      Alan JONELLE Coombs, PA-C 7:38  AM

## 2024-02-28 ENCOUNTER — Encounter: Payer: Self-pay | Admitting: Pediatrics

## 2024-02-28 ENCOUNTER — Ambulatory Visit (AMBULATORY_SURGERY_CENTER): Admitting: Pediatrics

## 2024-02-28 VITALS — BP 127/68 | HR 64 | Temp 97.5°F | Resp 16 | Ht 65.5 in | Wt 185.0 lb

## 2024-02-28 DIAGNOSIS — K3189 Other diseases of stomach and duodenum: Secondary | ICD-10-CM

## 2024-02-28 DIAGNOSIS — R112 Nausea with vomiting, unspecified: Secondary | ICD-10-CM

## 2024-02-28 DIAGNOSIS — R1310 Dysphagia, unspecified: Secondary | ICD-10-CM | POA: Diagnosis not present

## 2024-02-28 DIAGNOSIS — K219 Gastro-esophageal reflux disease without esophagitis: Secondary | ICD-10-CM

## 2024-02-28 DIAGNOSIS — K2289 Other specified disease of esophagus: Secondary | ICD-10-CM | POA: Diagnosis not present

## 2024-02-28 DIAGNOSIS — K2100 Gastro-esophageal reflux disease with esophagitis, without bleeding: Secondary | ICD-10-CM | POA: Diagnosis not present

## 2024-02-28 MED ORDER — SODIUM CHLORIDE 0.9 % IV SOLN: 500.0000 mL | Freq: Once | INTRAVENOUS | Status: DC

## 2024-02-28 NOTE — Progress Notes (Signed)
 Called to room to assist during endoscopic procedure.  Patient ID and intended procedure confirmed with present staff. Received instructions for my participation in the procedure from the performing physician.

## 2024-02-28 NOTE — Op Note (Signed)
 [facility] Patient Name: Kimberly Whitney Procedure Date: 02/28/2024 8:07 AM MRN: [id number] Endoscopist: Inocente Hausen , MD, [id number] Age: 73 Referring MD:  Date of Birth: 11-24-1950 Gender: Female Account #: [id number] Procedure:                Upper GI endoscopy Indications:              Dysphagia, Heartburn, Follow-up of                            gastro-esophageal reflux disease, Failure to                            respond to medical treatment, Nausea, LPR Medicines:                Monitored Anesthesia Care Procedure:                Pre-Anesthesia Assessment:                           - Prior to the procedure, a History and Physical                            was performed, and patient medications and                            allergies were reviewed. The patient's tolerance of                            previous anesthesia was also reviewed. The risks                            and benefits of the procedure and the sedation                            options and risks were discussed with the patient.                            All questions were answered, and informed consent                            was obtained. Prior Anticoagulants: The patient has                            taken no anticoagulant or antiplatelet agents. ASA                            Grade Assessment: II - A patient with mild systemic                            disease. After reviewing the risks and benefits,                            the patient was deemed in satisfactory condition to  undergo the procedure.                           After obtaining informed consent, the endoscope was                            passed under direct vision. Throughout the                            procedure, the patient's blood pressure, pulse, and                            oxygen saturations were monitored continuously. The                            Olympus Scope [id number] was  introduced through the                            mouth, and advanced to the second part of duodenum.                            The upper GI endoscopy was accomplished without                            difficulty. The patient tolerated the procedure                            well. Scope In: Scope Out: Findings:                 No endoscopic abnormality was evident in the                            esophagus to explain the patient's complaint of                            dysphagia. It was decided, however, to proceed with                            dilation of the entire esophagus. A guidewire was                            placed and the scope was withdrawn. Dilation was                            performed with a Savary dilator with no resistance                            at 16 mm and 17 mm. Biopsies were obtained from the                            proximal and distal esophagus with cold forceps for  histology for evaluation of eosinophilic                            esophagitis.                           The gastric body, gastric antrum, cardia (on                            retroflexion) and gastric fundus (on retroflexion)                            were normal. Biopsies were taken with a cold                            forceps for Helicobacter pylori testing.                           The duodenal bulb and second portion of the                            duodenum were normal. Complications:            No immediate complications. Estimated blood loss:                            Minimal. Estimated Blood Loss:     Estimated blood loss was minimal. Impression:               - No endoscopic esophageal abnormality to explain                            patient's dysphagia. Esophagus dilated. Dilated.                           - Normal gastric body, antrum, cardia and gastric                            fundus. Biopsied.                           - Normal  duodenal bulb and second portion of the                            duodenum.                           - Biopsies were taken with a cold forceps for                            evaluation of eosinophilic esophagitis. Recommendation:           - Discharge patient to home (ambulatory).                           - Await pathology results.                           -  The findings and recommendations were discussed                            with the patient's family.                           - Return to GI clinic in 6-8 weeks with Dr. Suzann                            or APP Oscar Coombs or [facility] May)                           - Patient has a contact number available for                            emergencies. The signs and symptoms of potential                            delayed complications were discussed with the                            patient. Return to normal activities tomorrow.                            Written discharge instructions were provided to the                            patient. [name], MD 02/28/2024 8:28:36 AM This report has been signed electronically.

## 2024-02-28 NOTE — Patient Instructions (Signed)

## 2024-02-28 NOTE — Progress Notes (Signed)
 Sedate, gd SR, tolerated procedure well, VSS, report to RN

## 2024-02-29 ENCOUNTER — Telehealth: Payer: Self-pay

## 2024-02-29 NOTE — Telephone Encounter (Signed)
  Follow up Call-     02/28/2024    7:41 AM  Call back number  Post procedure Call Back phone  # [phone]  Permission to leave phone message Yes     Patient questions:  Do you have a fever, pain , or abdominal swelling? No. Pain Score  0 *  Have you tolerated food without any problems? Yes.    Have you been able to return to your normal activities? Yes.    Do you have any questions about your discharge instructions: Diet   No. Medications  No. Follow up visit  No.  Do you have questions or concerns about your Care? No.  Actions: * If pain score is 4 or above: No action needed, pain <4.

## 2024-03-01 LAB — SURGICAL PATHOLOGY

## 2024-03-03 ENCOUNTER — Ambulatory Visit: Payer: Self-pay | Admitting: Pediatrics

## 2024-03-06 ENCOUNTER — Other Ambulatory Visit: Payer: Self-pay | Admitting: Family Medicine

## 2024-03-20 ENCOUNTER — Other Ambulatory Visit: Payer: Self-pay | Admitting: Family Medicine

## 2024-03-20 DIAGNOSIS — I10 Essential (primary) hypertension: Secondary | ICD-10-CM

## 2024-03-25 ENCOUNTER — Other Ambulatory Visit: Payer: Self-pay | Admitting: Family Medicine

## 2024-03-25 ENCOUNTER — Encounter: Payer: Self-pay | Admitting: Family Medicine

## 2024-03-25 ENCOUNTER — Other Ambulatory Visit: Payer: Self-pay

## 2024-03-25 DIAGNOSIS — F411 Generalized anxiety disorder: Secondary | ICD-10-CM

## 2024-03-25 DIAGNOSIS — E782 Mixed hyperlipidemia: Secondary | ICD-10-CM

## 2024-03-25 MED ORDER — ESCITALOPRAM OXALATE 5 MG PO TABS: 5.0000 mg | ORAL_TABLET | Freq: Every day | ORAL | 1 refills | Status: AC

## 2024-04-04 DIAGNOSIS — K080 Exfoliation of teeth due to systemic causes: Secondary | ICD-10-CM | POA: Diagnosis not present

## 2024-05-21 ENCOUNTER — Other Ambulatory Visit: Payer: Self-pay

## 2024-05-21 DIAGNOSIS — E782 Mixed hyperlipidemia: Secondary | ICD-10-CM

## 2024-05-21 DIAGNOSIS — I10 Essential (primary) hypertension: Secondary | ICD-10-CM

## 2024-05-24 ENCOUNTER — Other Ambulatory Visit: Payer: Medicare (Managed Care)

## 2024-05-24 DIAGNOSIS — I10 Essential (primary) hypertension: Secondary | ICD-10-CM

## 2024-05-24 DIAGNOSIS — E782 Mixed hyperlipidemia: Secondary | ICD-10-CM

## 2024-05-24 LAB — COMPREHENSIVE METABOLIC PANEL WITH GFR
ALT: 19 IU/L (ref 0–32)
AST: 21 IU/L (ref 0–40)
Albumin: 4.5 g/dL (ref 3.8–4.8)
Alkaline Phosphatase: 84 IU/L (ref 49–135)
BUN/Creatinine Ratio: 17 (ref 12–28)
BUN: 15 mg/dL (ref 8–27)
Bilirubin Total: 0.8 mg/dL (ref 0.0–1.2)
CO2: 26 mmol/L (ref 20–29)
Calcium: 9.9 mg/dL (ref 8.7–10.3)
Chloride: 94 mmol/L — ABNORMAL LOW (ref 96–106)
Creatinine, Ser: 0.89 mg/dL (ref 0.57–1.00)
Globulin, Total: 2.1 g/dL (ref 1.5–4.5)
Glucose: 91 mg/dL (ref 70–99)
Potassium: 3.9 mmol/L (ref 3.5–5.2)
Sodium: 135 mmol/L (ref 134–144)
Total Protein: 6.6 g/dL (ref 6.0–8.5)
eGFR: 69 mL/min/1.73

## 2024-05-24 LAB — LIPID PANEL
Chol/HDL Ratio: 3 ratio (ref 0.0–4.4)
Cholesterol, Total: 173 mg/dL (ref 100–199)
HDL: 58 mg/dL
LDL Chol Calc (NIH): 97 mg/dL (ref 0–99)
Triglycerides: 99 mg/dL (ref 0–149)
VLDL Cholesterol Cal: 18 mg/dL (ref 5–40)

## 2024-05-24 LAB — CBC WITH DIFFERENTIAL/PLATELET
Basophils Absolute: 0.1 x10E3/uL (ref 0.0–0.2)
Basos: 1 %
EOS (ABSOLUTE): 0.7 x10E3/uL — ABNORMAL HIGH (ref 0.0–0.4)
Eos: 11 %
Hematocrit: 42 % (ref 34.0–46.6)
Hemoglobin: 14.1 g/dL (ref 11.1–15.9)
Immature Grans (Abs): 0 x10E3/uL (ref 0.0–0.1)
Immature Granulocytes: 0 %
Lymphocytes Absolute: 1.3 x10E3/uL (ref 0.7–3.1)
Lymphs: 19 %
MCH: 31 pg (ref 26.6–33.0)
MCHC: 33.6 g/dL (ref 31.5–35.7)
MCV: 92 fL (ref 79–97)
Monocytes Absolute: 0.5 x10E3/uL (ref 0.1–0.9)
Monocytes: 8 %
Neutrophils Absolute: 4.2 x10E3/uL (ref 1.4–7.0)
Neutrophils: 61 %
Platelets: 236 x10E3/uL (ref 150–450)
RBC: 4.55 x10E6/uL (ref 3.77–5.28)
RDW: 12.1 % (ref 11.7–15.4)
WBC: 6.9 x10E3/uL (ref 3.4–10.8)

## 2024-05-27 ENCOUNTER — Ambulatory Visit: Payer: Self-pay | Admitting: Family Medicine

## 2024-05-28 ENCOUNTER — Other Ambulatory Visit: Payer: Medicare (Managed Care)

## 2024-05-30 ENCOUNTER — Ambulatory Visit: Payer: Medicare (Managed Care) | Admitting: Family Medicine

## 2024-05-30 VITALS — BP 124/74 | HR 67 | Temp 98.0°F | Ht 65.5 in | Wt 187.0 lb

## 2024-05-30 DIAGNOSIS — I10 Essential (primary) hypertension: Secondary | ICD-10-CM | POA: Diagnosis not present

## 2024-05-30 DIAGNOSIS — E782 Mixed hyperlipidemia: Secondary | ICD-10-CM | POA: Diagnosis not present

## 2024-05-30 DIAGNOSIS — K219 Gastro-esophageal reflux disease without esophagitis: Secondary | ICD-10-CM | POA: Diagnosis not present

## 2024-05-30 DIAGNOSIS — F411 Generalized anxiety disorder: Secondary | ICD-10-CM | POA: Diagnosis not present

## 2024-05-30 MED ORDER — LANSOPRAZOLE 30 MG PO CPDR: 30.0000 mg | DELAYED_RELEASE_CAPSULE | Freq: Every day | ORAL | 2 refills | Status: AC

## 2024-05-30 NOTE — Patient Instructions (Signed)
 Change nexium  to prevacid  30 mg daily.

## 2024-05-30 NOTE — Progress Notes (Unsigned)
 "  Subjective:  Patient ID: Kimberly Whitney, female    DOB: [birth year]  Age: 74 y.o. MRN: [id number]  Chief Complaint  Patient presents with   Medical Management of Chronic Issues    HPI: Discussed the use of AI scribe software for clinical note transcription with the patient, who gave verbal consent to proceed.  History of Present Illness Kimberly Whitney is a 74 year old female with gastroesophageal reflux disease who presents for follow-up after esophageal dilation.  Gastroesophageal reflux symptoms and esophageal dilation - History of gastroesophageal reflux disease (GERD) with recent esophageal dilation performed - Esophageal biopsies showed changes consistent with acid reflux; no H. pylori infection detected - Improvement in GERD symptoms over the past month - Previous trials on pantoprazole , omeprazole , famotidine , Voquezna , and sucralfate ; all of which she failed on due to persistent reflux symptoms causing eophagitis.  - Currently taking esomeprazole  (Nexium ) once daily, previously twice daily, 20 to 30 minutes before meals - given after egd in the fall  - requesting to trial something else due to cost. Consider trial on prevacid  or work on PA for nexium  and tier reduction appeal    Constipation - Constipation since starting reflux medication (voquezna .) - Daily bowel movements achieved with some difficulty - Increased water intake  Hypertension - Currently taking Diovan  320 mg once daily, Toprol  XL 50 mg once daily, chlorthalidone  50 mg once daily, and hydralazine  25 mg three times daily  Hyperlipidemia - Currently taking pravastatin  20 mg daily  Depression/Anxiety - lexapro  5 mg daily.       05/30/2024   10:23 AM 11/28/2023   10:36 AM 09/26/2023    3:06 PM 07/20/2023   10:42 AM 02/02/2023   10:26 AM  Depression screen PHQ 2/9  Decreased Interest 0 0 0 0 0  Down, Depressed, Hopeless 0 0 0 0 0  PHQ - 2 Score 0 0 0 0 0  Altered sleeping 0 0 1  0  Tired, decreased energy 0  0 3  0  Change in appetite 0 0 1  0  Feeling bad or failure about yourself  0 0 0  0  Trouble concentrating 0 0 0  0  Moving slowly or fidgety/restless 0 0 0  0  Suicidal thoughts 0 0 0  0  PHQ-9 Score 0 0  5   0   Difficult doing work/chores Not difficult at all Not difficult at all Somewhat difficult  Not difficult at all     Data saved with a previous flowsheet row definition        11/28/2023   10:36 AM  Fall Risk   Falls in the past year? 0  Number falls in past yr: 0  Injury with Fall? 0   Risk for fall due to : No Fall Risks  Follow up Falls evaluation completed     Data saved with a previous flowsheet row definition    Patient Care Team: Sherre Clapper, MD as PCP - General (Family Medicine) Jerri Kay HERO, MD as Attending Physician (Orthopedic Surgery)   Review of Systems  Constitutional:  Negative for chills, fatigue and fever.  HENT:  Negative for congestion, ear pain and sore throat.   Respiratory:  Negative for cough and shortness of breath.   Cardiovascular:  Negative for chest pain.  Gastrointestinal:  Negative for abdominal pain, constipation, diarrhea, nausea and vomiting.  Genitourinary:  Negative for dysuria and urgency.  Musculoskeletal:  Negative for arthralgias and myalgias.  Skin:  Negative for rash.  Neurological:  Negative for dizziness and headaches.  Psychiatric/Behavioral:  Negative for dysphoric mood. The patient is not nervous/anxious.     Medications Ordered Prior to Encounter[1] Past Medical History:  Diagnosis Date   Basal cell carcinoma (BCC) 09/2021   right forearm   Blood transfusion without reported diagnosis    with first hip fracture   Cancer [facility])    Cataract    Closed nondisplaced intertrochanteric fracture of left femur (HCC)    GERD (gastroesophageal reflux disease)    Hypertension    Intertrochanteric fracture of left hip (HCC) 01/29/2020   Osteopenia    Squamous cell cancer of skin of forearm, left    Squamous cell  carcinoma of leg, left    Past Surgical History:  Procedure Laterality Date   BREAST EXCISIONAL BIOPSY Right    years ago   CHOLECYSTECTOMY     FRACTURE SURGERY     L hip 30 years ago   INTRAMEDULLARY (IM) NAIL INTERTROCHANTERIC Left 01/23/2020   Procedure: INTRAMEDULLARY (IM) NAIL INTERTROCHANTRIC;  Surgeon: Jerri Kay HERO, MD;  Location: [facility] OR;  Service: Orthopedics;  Laterality: Left;   IR RADIOLOGIST EVAL & MGMT  01/24/2024   right cataract  05/2021    Family History  Problem Relation Age of Onset   Transient ischemic attack Mother    Dementia Mother    Cancer Father    Hypertension Father    Osteoarthritis Sister    Depression Sister    Melanoma Brother    Basal cell carcinoma Brother    Squamous cell carcinoma Brother    Breast cancer Maternal Aunt    Breast cancer Maternal Aunt    Breast cancer Paternal Aunt    Pancreatic cancer Paternal Uncle    Esophageal cancer Other    Colon cancer Neg Hx    Stomach cancer Neg Hx    Social History   Socioeconomic History   Marital status: Married    Spouse name: Not on file   Number of children: 3   Years of education: Not on file   Highest education level: 12th grade  Occupational History   Not on file  Tobacco Use   Smoking status: Former    Current packs/day: 0.00    Types: Cigarettes    Quit date: 1980    Years since quitting: 46.1   Smokeless tobacco: Never  Vaping Use   Vaping status: Never Used  Substance and Sexual Activity   Alcohol use: Never   Drug use: Never   Sexual activity: Not on file  Other Topics Concern   Not on file  Social History Narrative   Not on file   Social Drivers of Health   Tobacco Use: Medium Risk (06/03/2024)   Patient History    Smoking Tobacco Use: Former    Smokeless Tobacco Use: Never    Passive Exposure: Not on Actuary Strain: Low Risk (02/18/2024)   Overall Financial Resource Strain (CARDIA)    Difficulty of Paying Living Expenses: Not hard at all   Food Insecurity: No Food Insecurity (02/18/2024)   Epic    Worried About Programme Researcher, Broadcasting/film/video in the Last Year: Never true    Ran Out of Food in the Last Year: Never true  Transportation Needs: No Transportation Needs (02/18/2024)   Epic    Lack of Transportation (Medical): No    Lack of Transportation (Non-Medical): No  Physical Activity: Sufficiently Active (02/18/2024)   Exercise Vital Sign    Days of  Exercise per Week: 4 days    Minutes of Exercise per Session: 70 min  Stress: No Stress Concern Present (02/18/2024)   [employer] of Occupational Health - Occupational Stress Questionnaire    Feeling of Stress: Not at all  Social Connections: Socially Integrated (02/18/2024)   Social Connection and Isolation Panel    Frequency of Communication with Friends and Family: More than three times a week    Frequency of Social Gatherings with Friends and Family: Twice a week    Attends Religious Services: 1 to 4 times per year    Active Member of Clubs or Organizations: Yes    Attends Banker Meetings: More than 4 times per year    Marital Status: Married  Depression (PHQ2-9): Low Risk (05/30/2024)   Depression (PHQ2-9)    PHQ-2 Score: 0  Alcohol Screen: Low Risk (10/31/2023)   Alcohol Screen    Last Alcohol Screening Score (AUDIT): 0  Housing: Low Risk (02/18/2024)   Epic    Unable to Pay for Housing in the Last Year: No    Number of Times Moved in the Last Year: 0    Homeless in the Last Year: No  Utilities: Not At Risk (10/31/2023)   Epic    Threatened with loss of utilities: No  Health Literacy: Adequate Health Literacy (10/31/2023)   B1300 Health Literacy    Frequency of need for help with medical instructions: Never    Objective:  BP 124/74   Pulse 67   Temp 98 F (36.7 C)   Ht 5' 5.5 (1.664 m)   Wt 187 lb (84.8 kg)   SpO2 97%   BMI 30.65 kg/m      05/30/2024   10:20 AM 02/28/2024    8:50 AM 02/28/2024    8:38 AM  BP/Weight  Systolic BP 124 127  125  Diastolic BP 74 68 64  Wt. (Lbs) 187    BMI 30.65 kg/m2      Physical Exam Vitals reviewed.  Constitutional:      Appearance: Normal appearance. She is normal weight.  Neck:     Vascular: No carotid bruit.  Cardiovascular:     Rate and Rhythm: Normal rate and regular rhythm.     Heart sounds: Normal heart sounds.  Pulmonary:     Effort: Pulmonary effort is normal. No respiratory distress.     Breath sounds: Normal breath sounds.  Abdominal:     General: Abdomen is flat. Bowel sounds are normal.     Palpations: Abdomen is soft.     Tenderness: There is no abdominal tenderness.  Neurological:     Mental Status: She is alert and oriented to person, place, and time.  Psychiatric:        Mood and Affect: Mood normal.        Behavior: Behavior normal.         Lab Results  Component Value Date   WBC 6.9 05/24/2024   HGB 14.1 05/24/2024   HCT 42.0 05/24/2024   PLT 236 05/24/2024   GLUCOSE 91 05/24/2024   CHOL 173 05/24/2024   TRIG 99 05/24/2024   HDL 58 05/24/2024   LDLCALC 97 05/24/2024   ALT 19 05/24/2024   AST 21 05/24/2024   NA 135 05/24/2024   K 3.9 05/24/2024   CL 94 (L) 05/24/2024   CREATININE 0.89 05/24/2024   BUN 15 05/24/2024   CO2 26 05/24/2024   TSH 1.760 01/02/2024   INR 1.0 01/23/2020   HGBA1C  5.5 10/09/2020    Results for orders placed or performed in visit on 05/24/24  CBC with Differential/Platelet   Collection Time: 05/24/24  9:02 AM  Result Value Ref Range   WBC 6.9 3.4 - 10.8 x10E3/uL   RBC 4.55 3.77 - 5.28 x10E6/uL   Hemoglobin 14.1 11.1 - 15.9 g/dL   Hematocrit 57.9 65.9 - 46.6 %   MCV 92 79 - 97 fL   MCH 31.0 26.6 - 33.0 pg   MCHC 33.6 31.5 - 35.7 g/dL   RDW 87.8 88.2 - 84.5 %   Platelets 236 150 - 450 x10E3/uL   Neutrophils 61 Not Estab. %   Lymphs 19 Not Estab. %   Monocytes 8 Not Estab. %   Eos 11 Not Estab. %   Basos 1 Not Estab. %   Neutrophils Absolute 4.2 1.4 - 7.0 x10E3/uL   Lymphocytes Absolute 1.3 0.7 - 3.1  x10E3/uL   Monocytes Absolute 0.5 0.1 - 0.9 x10E3/uL   EOS (ABSOLUTE) 0.7 (H) 0.0 - 0.4 x10E3/uL   Basophils Absolute 0.1 0.0 - 0.2 x10E3/uL   Immature Granulocytes 0 Not Estab. %   Immature Grans (Abs) 0.0 0.0 - 0.1 x10E3/uL  Comprehensive metabolic panel   Collection Time: 05/24/24  9:02 AM  Result Value Ref Range   Glucose 91 70 - 99 mg/dL   BUN 15 8 - 27 mg/dL   Creatinine, Ser 9.10 0.57 - 1.00 mg/dL   eGFR 69 >40 fO/fpw/8.26   BUN/Creatinine Ratio 17 12 - 28   Sodium 135 134 - 144 mmol/L   Potassium 3.9 3.5 - 5.2 mmol/L   Chloride 94 (L) 96 - 106 mmol/L   CO2 26 20 - 29 mmol/L   Calcium 9.9 8.7 - 10.3 mg/dL   Total Protein 6.6 6.0 - 8.5 g/dL   Albumin 4.5 3.8 - 4.8 g/dL   Globulin, Total 2.1 1.5 - 4.5 g/dL   Bilirubin Total 0.8 0.0 - 1.2 mg/dL   Alkaline Phosphatase 84 49 - 135 IU/L   AST 21 0 - 40 IU/L   ALT 19 0 - 32 IU/L  Lipid panel   Collection Time: 05/24/24  9:02 AM  Result Value Ref Range   Cholesterol, Total 173 100 - 199 mg/dL   Triglycerides 99 0 - 149 mg/dL   HDL 58 >60 mg/dL   VLDL Cholesterol Cal 18 5 - 40 mg/dL   LDL Chol Calc (NIH) 97 0 - 99 mg/dL   Chol/HDL Ratio 3.0 0.0 - 4.4 ratio  .  Assessment & Plan:   Assessment & Plan Essential hypertension Blood pressure well-controlled on chlorthalidone , hydralazine , valsartan , and metoprolol  succinate.    Gastroesophageal reflux disease without esophagitis GERD with esophageal dilation. Biopsies showed acid reflux changes, no H. pylori. Symptoms improved with Nexium , but insurance issues persist. - Sent prescription for lansoprazole  once daily. - Instructed to take lansoprazole  before the largest meal of the day. - Consider prior authorization for Nexium  if lansoprazole  is not affordable. - Monitor for recurrence of reflux symptoms and report if symptoms worsen.    Mixed hyperlipidemia Managed with pravastatin  20 mg daily.    GAD (generalized anxiety disorder) - Lexapro  5 mg daily.      Body  mass index is 30.65 kg/m.    Meds ordered this encounter  Medications   lansoprazole  (PREVACID ) 30 MG capsule    Sig: Take 1 capsule (30 mg total) by mouth daily at 12 noon.    Dispense:  30 capsule  Refill:  2    No orders of the defined types were placed in this encounter.    LILLETTE Kato I Leal-Borjas,acting as a scribe for Abigail Free, MD.,have documented all relevant documentation on the behalf of Abigail Free, MD,as directed by  Abigail Free, MD while in the presence of Abigail Free, MD.   Follow-up: Return in about 6 months (around 11/27/2024) for chronic follow up.  An After Visit Summary was printed and given to the patient.  I attest that I have reviewed this visit and agree with the plan scribed by my staff.   Abigail Free, MD [facility] [phone]       [1]  Current Outpatient Medications on File Prior to Visit  Medication Sig Dispense Refill   aspirin EC 81 MG tablet Take 81 mg by mouth daily.     calcium-vitamin D (OSCAL WITH D) 500-200 MG-UNIT tablet Take 1 tablet by mouth 2 (two) times daily.     cetirizine (ZYRTEC) 10 MG tablet Take 10 mg by mouth daily.     chlorthalidone  (HYGROTON ) 50 MG tablet TAKE ONE TABLET BY MOUTH ONCE DAILY 90 tablet 1   cholecalciferol (VITAMIN D3) 25 MCG (1000 UNIT) tablet Take 2,000 Units by mouth daily.     escitalopram  (LEXAPRO ) 5 MG tablet Take 1 tablet (5 mg total) by mouth daily. 90 tablet 1   fluticasone  (FLONASE ) 50 MCG/ACT nasal spray Place 2 sprays into both nostrils daily. 16 g 6   hydrALAZINE  (APRESOLINE ) 25 MG tablet Take 1 tablet (25 mg total) by mouth 3 (three) times daily. 90 tablet 2   MAGNESIUM  PO Take 1 each by mouth daily.     metoprolol  succinate (TOPROL -XL) 50 MG 24 hr tablet Take 1 tablet (50 mg total) by mouth daily. Take with or immediately following a meal. 90 tablet 3   pravastatin  (PRAVACHOL ) 20 MG tablet Take 1 tablet (20 mg total) by mouth daily. 90 tablet 1   triamcinolone cream (KENALOG) 0.1  % Apply 1 application topically 2 (two) times daily.     valsartan  (DIOVAN ) 320 MG tablet Take 1 tablet (320 mg total) by mouth daily. 90 tablet 1   No current facility-administered medications on file prior to visit.   "

## 2024-06-01 NOTE — Assessment & Plan Note (Signed)
 GERD with esophageal dilation. Biopsies showed acid reflux changes, no H. pylori. Symptoms improved with Nexium , but insurance issues persist. - Sent prescription for lansoprazole  once daily. - Instructed to take lansoprazole  before the largest meal of the day. - Consider prior authorization for Nexium  if lansoprazole  is not affordable. - Monitor for recurrence of reflux symptoms and report if symptoms worsen.

## 2024-06-01 NOTE — Assessment & Plan Note (Signed)
 Blood pressure well-controlled on chlorthalidone , hydralazine , valsartan , and metoprolol  succinate.

## 2024-06-01 NOTE — Assessment & Plan Note (Signed)
Managed with pravastatin 20 mg daily  

## 2024-06-03 ENCOUNTER — Encounter: Payer: Self-pay | Admitting: Family Medicine

## 2024-06-03 NOTE — Assessment & Plan Note (Signed)
Lexapro 5 mg daily

## 2024-11-26 ENCOUNTER — Other Ambulatory Visit: Payer: Medicare (Managed Care)

## 2024-11-27 ENCOUNTER — Ambulatory Visit: Payer: Medicare (Managed Care) | Admitting: Family Medicine
